# Patient Record
Sex: Female | Born: 1975 | Race: Black or African American | Hispanic: No | Smoking: Never smoker
Health system: Southern US, Community
[De-identification: ages and names within clinical notes are randomized; demographics above are authoritative.]

## PROBLEM LIST (undated history)

## (undated) DIAGNOSIS — R569 Unspecified convulsions: Secondary | ICD-10-CM

## (undated) DIAGNOSIS — D649 Anemia, unspecified: Secondary | ICD-10-CM

## (undated) DIAGNOSIS — M797 Fibromyalgia: Secondary | ICD-10-CM

## (undated) DIAGNOSIS — I10 Essential (primary) hypertension: Secondary | ICD-10-CM

## (undated) DIAGNOSIS — G5603 Carpal tunnel syndrome, bilateral upper limbs: Secondary | ICD-10-CM

## (undated) DIAGNOSIS — M1990 Unspecified osteoarthritis, unspecified site: Secondary | ICD-10-CM

## (undated) DIAGNOSIS — F32A Depression, unspecified: Secondary | ICD-10-CM

## (undated) DIAGNOSIS — E7800 Pure hypercholesterolemia, unspecified: Secondary | ICD-10-CM

## (undated) DIAGNOSIS — M545 Low back pain, unspecified: Secondary | ICD-10-CM

## (undated) DIAGNOSIS — G43909 Migraine, unspecified, not intractable, without status migrainosus: Secondary | ICD-10-CM

## (undated) DIAGNOSIS — J309 Allergic rhinitis, unspecified: Secondary | ICD-10-CM

## (undated) DIAGNOSIS — I82629 Acute embolism and thrombosis of deep veins of unspecified upper extremity: Secondary | ICD-10-CM

## (undated) DIAGNOSIS — G4700 Insomnia, unspecified: Secondary | ICD-10-CM

## (undated) DIAGNOSIS — K219 Gastro-esophageal reflux disease without esophagitis: Secondary | ICD-10-CM

## (undated) DIAGNOSIS — F319 Bipolar disorder, unspecified: Secondary | ICD-10-CM

## (undated) DIAGNOSIS — T7840XA Allergy, unspecified, initial encounter: Secondary | ICD-10-CM

## (undated) DIAGNOSIS — F329 Major depressive disorder, single episode, unspecified: Secondary | ICD-10-CM

## (undated) DIAGNOSIS — F419 Anxiety disorder, unspecified: Secondary | ICD-10-CM

## (undated) HISTORY — DX: Allergy, unspecified, initial encounter: T78.40XA

## (undated) HISTORY — DX: Essential (primary) hypertension: I10

## (undated) HISTORY — PX: FOOT SURGERY: SHX648

## (undated) HISTORY — DX: Insomnia, unspecified: G47.00

## (undated) HISTORY — DX: Unspecified osteoarthritis, unspecified site: M19.90

## (undated) HISTORY — DX: Major depressive disorder, single episode, unspecified: F32.9

## (undated) HISTORY — PX: ABDOMINAL HYSTERECTOMY: SHX81

## (undated) HISTORY — DX: Allergic rhinitis, unspecified: J30.9

## (undated) HISTORY — DX: Low back pain: M54.5

## (undated) HISTORY — DX: Depression, unspecified: F32.A

## (undated) HISTORY — PX: TUBAL LIGATION: SHX77

## (undated) HISTORY — DX: Acute embolism and thrombosis of deep veins of unspecified upper extremity: I82.629

## (undated) HISTORY — DX: Gastro-esophageal reflux disease without esophagitis: K21.9

## (undated) HISTORY — DX: Anemia, unspecified: D64.9

## (undated) HISTORY — DX: Bipolar disorder, unspecified: F31.9

## (undated) HISTORY — DX: Carpal tunnel syndrome, bilateral upper limbs: G56.03

## (undated) HISTORY — DX: Low back pain, unspecified: M54.50

## (undated) HISTORY — DX: Fibromyalgia: M79.7

## (undated) HISTORY — DX: Anxiety disorder, unspecified: F41.9

---

## 2012-01-08 DIAGNOSIS — R300 Dysuria: Secondary | ICD-10-CM | POA: Diagnosis not present

## 2012-01-08 DIAGNOSIS — E559 Vitamin D deficiency, unspecified: Secondary | ICD-10-CM | POA: Diagnosis not present

## 2012-02-01 DIAGNOSIS — S63509A Unspecified sprain of unspecified wrist, initial encounter: Secondary | ICD-10-CM | POA: Diagnosis not present

## 2012-02-01 DIAGNOSIS — J309 Allergic rhinitis, unspecified: Secondary | ICD-10-CM | POA: Diagnosis not present

## 2012-04-23 DIAGNOSIS — L600 Ingrowing nail: Secondary | ICD-10-CM | POA: Diagnosis not present

## 2012-04-23 DIAGNOSIS — M2010 Hallux valgus (acquired), unspecified foot: Secondary | ICD-10-CM | POA: Diagnosis not present

## 2012-04-23 DIAGNOSIS — M79609 Pain in unspecified limb: Secondary | ICD-10-CM | POA: Diagnosis not present

## 2012-04-23 DIAGNOSIS — M24573 Contracture, unspecified ankle: Secondary | ICD-10-CM | POA: Diagnosis not present

## 2012-04-23 DIAGNOSIS — M24576 Contracture, unspecified foot: Secondary | ICD-10-CM | POA: Diagnosis not present

## 2012-05-28 DIAGNOSIS — E559 Vitamin D deficiency, unspecified: Secondary | ICD-10-CM | POA: Diagnosis not present

## 2012-06-11 DIAGNOSIS — M25529 Pain in unspecified elbow: Secondary | ICD-10-CM | POA: Diagnosis not present

## 2012-06-11 DIAGNOSIS — M26609 Unspecified temporomandibular joint disorder, unspecified side: Secondary | ICD-10-CM | POA: Diagnosis not present

## 2012-06-12 DIAGNOSIS — I889 Nonspecific lymphadenitis, unspecified: Secondary | ICD-10-CM | POA: Diagnosis not present

## 2012-09-11 DIAGNOSIS — R1032 Left lower quadrant pain: Secondary | ICD-10-CM | POA: Diagnosis not present

## 2012-09-11 DIAGNOSIS — R110 Nausea: Secondary | ICD-10-CM | POA: Diagnosis not present

## 2012-09-11 DIAGNOSIS — K5909 Other constipation: Secondary | ICD-10-CM | POA: Diagnosis not present

## 2012-09-11 DIAGNOSIS — K5000 Crohn's disease of small intestine without complications: Secondary | ICD-10-CM | POA: Diagnosis not present

## 2012-09-19 DIAGNOSIS — R933 Abnormal findings on diagnostic imaging of other parts of digestive tract: Secondary | ICD-10-CM | POA: Diagnosis not present

## 2012-09-19 DIAGNOSIS — K5909 Other constipation: Secondary | ICD-10-CM | POA: Diagnosis not present

## 2012-09-19 DIAGNOSIS — K5000 Crohn's disease of small intestine without complications: Secondary | ICD-10-CM | POA: Diagnosis not present

## 2012-09-19 DIAGNOSIS — R110 Nausea: Secondary | ICD-10-CM | POA: Diagnosis not present

## 2012-09-25 DIAGNOSIS — E119 Type 2 diabetes mellitus without complications: Secondary | ICD-10-CM | POA: Diagnosis not present

## 2012-09-25 DIAGNOSIS — G609 Hereditary and idiopathic neuropathy, unspecified: Secondary | ICD-10-CM | POA: Diagnosis not present

## 2012-09-25 DIAGNOSIS — M2010 Hallux valgus (acquired), unspecified foot: Secondary | ICD-10-CM | POA: Diagnosis not present

## 2012-09-25 DIAGNOSIS — M79609 Pain in unspecified limb: Secondary | ICD-10-CM | POA: Diagnosis not present

## 2012-09-25 DIAGNOSIS — E559 Vitamin D deficiency, unspecified: Secondary | ICD-10-CM | POA: Diagnosis not present

## 2012-09-25 DIAGNOSIS — D529 Folate deficiency anemia, unspecified: Secondary | ICD-10-CM | POA: Diagnosis not present

## 2012-09-25 DIAGNOSIS — D509 Iron deficiency anemia, unspecified: Secondary | ICD-10-CM | POA: Diagnosis not present

## 2012-09-25 DIAGNOSIS — L600 Ingrowing nail: Secondary | ICD-10-CM | POA: Diagnosis not present

## 2012-09-25 DIAGNOSIS — D518 Other vitamin B12 deficiency anemias: Secondary | ICD-10-CM | POA: Diagnosis not present

## 2012-10-09 DIAGNOSIS — M24573 Contracture, unspecified ankle: Secondary | ICD-10-CM | POA: Diagnosis not present

## 2012-10-09 DIAGNOSIS — G609 Hereditary and idiopathic neuropathy, unspecified: Secondary | ICD-10-CM | POA: Diagnosis not present

## 2012-10-09 DIAGNOSIS — M24576 Contracture, unspecified foot: Secondary | ICD-10-CM | POA: Diagnosis not present

## 2012-10-09 DIAGNOSIS — L600 Ingrowing nail: Secondary | ICD-10-CM | POA: Diagnosis not present

## 2012-10-09 DIAGNOSIS — M2010 Hallux valgus (acquired), unspecified foot: Secondary | ICD-10-CM | POA: Diagnosis not present

## 2012-10-31 DIAGNOSIS — E119 Type 2 diabetes mellitus without complications: Secondary | ICD-10-CM | POA: Diagnosis not present

## 2012-10-31 DIAGNOSIS — H25019 Cortical age-related cataract, unspecified eye: Secondary | ICD-10-CM | POA: Diagnosis not present

## 2013-03-17 DIAGNOSIS — T148 Other injury of unspecified body region: Secondary | ICD-10-CM | POA: Diagnosis not present

## 2013-03-17 DIAGNOSIS — W57XXXA Bitten or stung by nonvenomous insect and other nonvenomous arthropods, initial encounter: Secondary | ICD-10-CM | POA: Diagnosis not present

## 2013-04-07 DIAGNOSIS — H60399 Other infective otitis externa, unspecified ear: Secondary | ICD-10-CM | POA: Diagnosis not present

## 2013-04-07 DIAGNOSIS — S63509A Unspecified sprain of unspecified wrist, initial encounter: Secondary | ICD-10-CM | POA: Diagnosis not present

## 2013-04-10 DIAGNOSIS — L723 Sebaceous cyst: Secondary | ICD-10-CM | POA: Diagnosis not present

## 2013-04-16 DIAGNOSIS — J309 Allergic rhinitis, unspecified: Secondary | ICD-10-CM | POA: Diagnosis not present

## 2013-04-16 DIAGNOSIS — R070 Pain in throat: Secondary | ICD-10-CM | POA: Diagnosis not present

## 2013-04-16 DIAGNOSIS — J018 Other acute sinusitis: Secondary | ICD-10-CM | POA: Diagnosis not present

## 2013-04-22 DIAGNOSIS — L910 Hypertrophic scar: Secondary | ICD-10-CM | POA: Diagnosis not present

## 2013-04-30 ENCOUNTER — Emergency Department (HOSPITAL_BASED_OUTPATIENT_CLINIC_OR_DEPARTMENT_OTHER): Payer: Medicare Other

## 2013-04-30 ENCOUNTER — Encounter (HOSPITAL_BASED_OUTPATIENT_CLINIC_OR_DEPARTMENT_OTHER): Payer: Self-pay | Admitting: Emergency Medicine

## 2013-04-30 ENCOUNTER — Emergency Department (HOSPITAL_BASED_OUTPATIENT_CLINIC_OR_DEPARTMENT_OTHER)
Admission: EM | Admit: 2013-04-30 | Discharge: 2013-04-30 | Disposition: A | Discharge status: Home / Self Care | Payer: Medicare Other | Attending: Emergency Medicine | Admitting: Emergency Medicine

## 2013-04-30 DIAGNOSIS — R209 Unspecified disturbances of skin sensation: Secondary | ICD-10-CM | POA: Diagnosis not present

## 2013-04-30 DIAGNOSIS — Z79899 Other long term (current) drug therapy: Secondary | ICD-10-CM | POA: Diagnosis not present

## 2013-04-30 DIAGNOSIS — H539 Unspecified visual disturbance: Secondary | ICD-10-CM | POA: Diagnosis not present

## 2013-04-30 DIAGNOSIS — G43909 Migraine, unspecified, not intractable, without status migrainosus: Secondary | ICD-10-CM | POA: Insufficient documentation

## 2013-04-30 DIAGNOSIS — R002 Palpitations: Secondary | ICD-10-CM | POA: Diagnosis not present

## 2013-04-30 DIAGNOSIS — R51 Headache: Secondary | ICD-10-CM | POA: Diagnosis not present

## 2013-04-30 DIAGNOSIS — G40909 Epilepsy, unspecified, not intractable, without status epilepticus: Secondary | ICD-10-CM | POA: Insufficient documentation

## 2013-04-30 HISTORY — DX: Unspecified convulsions: R56.9

## 2013-04-30 HISTORY — DX: Migraine, unspecified, not intractable, without status migrainosus: G43.909

## 2013-04-30 MED ORDER — METOCLOPRAMIDE HCL 10 MG PO TABS: 10.0000 mg | ORAL_TABLET | Freq: Four times a day (QID) | ORAL | Status: DC | PRN

## 2013-04-30 MED ORDER — HYDROCODONE-ACETAMINOPHEN 5-325 MG PO TABS: 2.0000 | ORAL_TABLET | ORAL | Status: DC | PRN

## 2013-04-30 NOTE — ED Notes (Signed)
Pt c/o headaches, blurred vision, tingling in left arm. Pt has been dx with migraines a long time ago. Pt [facility] at [facility] [place] and was told to come to ED for CT head

## 2013-04-30 NOTE — ED Notes (Signed)
Called to room. Pt requesting her potassium and vit D level be checked while she is here. Dr. Blinda Leatherwood made aware.

## 2013-04-30 NOTE — ED Provider Notes (Signed)
History     CSN: [id number]  Arrival date & time 04/30/13  2007   First MD Initiated Contact with Patient 04/30/13 2033      Chief Complaint  Patient presents with  . Headache    (Consider location/radiation/quality/duration/timing/severity/associated sxs/prior treatment) HPI Comments: Patient presents to the ER for evaluation of headache. Patient reports that she has been having throbbing pain in the top of her head that runs down the sides of her head. Symptoms have been present for 2 weeks. Pain is waxing and waning. She has a history of migraines. Patient reports pain and tingling in her left arm down to the fingertips. She also has pain and tingling running up and down the left leg. She was seen at urgent care earlier and told to come to the ER to get a CAT scan of her head.  Patient is a 37 y.o. female presenting with headaches.  Headache Associated symptoms: numbness     Past Medical History  Diagnosis Date  . Migraine   . Seizures     Past Surgical History  Procedure Laterality Date  . Abdominal hysterectomy    . Tubal ligation    . Foot surgery      No family history on file.  History  Substance Use Topics  . Smoking status: Never Smoker   . Smokeless tobacco: Not on file  . Alcohol Use: No    OB History   Grav Para Term Preterm Abortions TAB SAB Ect Mult Living                  Review of Systems  Neurological: Positive for numbness and headaches.  All other systems reviewed and are negative.    Allergies  Phenergan and Toradol  Home Medications   Current Outpatient Rx  Name  Route  Sig  Dispense  Refill  . cetirizine (ZYRTEC) 10 MG tablet   Oral   Take 10 mg by mouth daily.         . cholecalciferol (VITAMIN D) 1000 UNITS tablet   Oral   Take 1,000 Units by mouth daily.         . fluticasone (FLONASE) 50 MCG/ACT nasal spray   Nasal   Place 2 sprays into the nose daily.         . Multiple Vitamin (MULTIVITAMIN) capsule    Oral   Take 1 capsule by mouth daily.         . ondansetron (ZOFRAN-ODT) 4 MG disintegrating tablet   Oral   Take 4 mg by mouth every 8 (eight) hours as needed for nausea.         Marland Kitchen topiramate (TOPAMAX) 200 MG tablet   Oral   Take 300 mg by mouth 3 (three) times daily.           BP 135/91  Pulse 90  Temp(Src) 97.8 F (36.6 C) (Oral)  Resp 18  Ht 5\' 9"  (1.753 m)  Wt 158 lb (71.668 kg)  BMI 23.32 kg/m2  SpO2 100%  Physical Exam  Constitutional: She is oriented to person, place, and time. She appears well-developed and well-nourished. No distress.  HENT:  Head: Normocephalic and atraumatic.  Right Ear: Hearing normal.  Left Ear: Hearing normal.  Nose: Nose normal.  Mouth/Throat: Oropharynx is clear and moist and mucous membranes are normal.  Eyes: Conjunctivae and EOM are normal. Pupils are equal, round, and reactive to light.  Neck: Normal range of motion. Neck supple.  Cardiovascular: Regular rhythm, S1  normal and S2 normal.  Exam reveals no gallop and no friction rub.   No murmur heard. Pulmonary/Chest: Effort normal and breath sounds normal. No respiratory distress. She exhibits no tenderness.  Abdominal: Soft. Normal appearance and bowel sounds are normal. There is no hepatosplenomegaly. There is no tenderness. There is no rebound, no guarding, no tenderness at McBurney's point and negative Murphy's sign. No hernia.  Musculoskeletal: Normal range of motion.  Neurological: She is alert and oriented to person, place, and time. She has normal strength. No cranial nerve deficit or sensory deficit. Coordination normal. GCS eye subscore is 4. GCS verbal subscore is 5. GCS motor subscore is 6.  Skin: Skin is warm, dry and intact. No rash noted. No cyanosis.  Psychiatric: She has a normal mood and affect. Her speech is normal and behavior is normal. Thought content normal.    ED Course  Procedures (including critical care time)  Labs Reviewed - No data to display Ct  Head Wo Contrast  04/30/2013   *RADIOLOGY REPORT*  Clinical Data: Headache.  CT HEAD WITHOUT CONTRAST  Technique:  Contiguous axial images were obtained from the base of the skull through the vertex without contrast.  Comparison: No priors.  Findings: No acute intracranial abnormalities.  Specifically, no evidence of acute intracranial hemorrhage, no definite findings of acute/subacute cerebral ischemia, no mass, mass effect, hydrocephalus or abnormal intra or extra-axial fluid collections. Visualized paranasal sinuses and mastoids are well pneumatized.  No acute displaced skull fractures are identified.  IMPRESSION: 1.  No acute intracranial abnormalities. 2.  The appearance of the brain is normal.   Original Report Authenticated By: Trudie Reed, M.D.     Diagnosis: Migraine    MDM  Patient presents to me for evaluation of headache. Patient reports that she has had a headache for 2 weeks. She reports previous history of migraines. She apparently went to urgent care earlier and was told to come to the ER because she needed a CAT scan. Did not find anything focal on her neurologic exam. She is complaining of pain and numbness and tingling in the left arm and left leg. Typically, however is in conjunction with the pain, not consistent with a central nervous system process. I have no suspicion for intracranial bleed such as subarachnoid hemorrhage based on her prolonged two-week course of this headache and history of migraines. CAT scan was performed and is negative. Patient was driving, has a small child with her. No medications provided here that would be sedating. He'll be prescribed Reglan and 6 Vicodin tablets to fill at the pharmacy to take when she gets home.       Gilda Crease, MD 04/30/13 2114

## 2013-09-29 DIAGNOSIS — G40909 Epilepsy, unspecified, not intractable, without status epilepticus: Secondary | ICD-10-CM | POA: Diagnosis not present

## 2013-09-29 DIAGNOSIS — G8929 Other chronic pain: Secondary | ICD-10-CM | POA: Diagnosis not present

## 2013-09-29 DIAGNOSIS — R109 Unspecified abdominal pain: Secondary | ICD-10-CM | POA: Diagnosis not present

## 2013-09-29 DIAGNOSIS — F329 Major depressive disorder, single episode, unspecified: Secondary | ICD-10-CM | POA: Insufficient documentation

## 2013-09-29 DIAGNOSIS — J309 Allergic rhinitis, unspecified: Secondary | ICD-10-CM | POA: Diagnosis not present

## 2013-09-29 DIAGNOSIS — E8940 Asymptomatic postprocedural ovarian failure: Secondary | ICD-10-CM | POA: Insufficient documentation

## 2013-09-29 DIAGNOSIS — E8941 Symptomatic postprocedural ovarian failure: Secondary | ICD-10-CM | POA: Diagnosis not present

## 2013-09-29 DIAGNOSIS — J302 Other seasonal allergic rhinitis: Secondary | ICD-10-CM | POA: Insufficient documentation

## 2013-10-07 ENCOUNTER — Encounter: Payer: Self-pay | Admitting: Neurology

## 2013-10-08 ENCOUNTER — Ambulatory Visit (INDEPENDENT_AMBULATORY_CARE_PROVIDER_SITE_OTHER): Payer: Medicare Other | Admitting: Neurology

## 2013-10-08 ENCOUNTER — Encounter (INDEPENDENT_AMBULATORY_CARE_PROVIDER_SITE_OTHER): Payer: Self-pay

## 2013-10-08 ENCOUNTER — Encounter: Payer: Self-pay | Admitting: Neurology

## 2013-10-08 VITALS — BP 156/92 | HR 77 | Temp 98.3°F | Ht 69.0 in | Wt 162.0 lb

## 2013-10-08 DIAGNOSIS — R569 Unspecified convulsions: Secondary | ICD-10-CM

## 2013-10-08 DIAGNOSIS — G43009 Migraine without aura, not intractable, without status migrainosus: Secondary | ICD-10-CM

## 2013-10-08 DIAGNOSIS — Z5181 Encounter for therapeutic drug level monitoring: Secondary | ICD-10-CM

## 2013-10-08 MED ORDER — RIZATRIPTAN BENZOATE 10 MG PO TBDP: 10.0000 mg | ORAL_TABLET | Freq: Three times a day (TID) | ORAL | Status: DC | PRN

## 2013-10-08 MED ORDER — PREDNISONE 5 MG PO TABS: ORAL_TABLET | ORAL | Status: DC
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
## 2013-10-08 NOTE — Patient Instructions (Signed)
Epilepsy A seizure (convulsion) is a sudden change in brain function that causes a change in behavior, muscle activity, or ability to remain awake and alert. If a person has recurring seizures, this is called epilepsy. CAUSES  Epilepsy is a disorder with many possible causes. Anything that disturbs the normal pattern of brain cell activity can lead to seizures. Seizure can be caused from illness to brain damage to abnormal brain development. Epilepsy may develop because of:  An abnormality in brain wiring.  An imbalance of nerve signaling chemicals (neurotransmitters).  Some combination of these factors. Scientists are learning an increasing amount about genetic causes of seizures. SYMPTOMS  The symptoms of a seizure can vary greatly from one person to another. These may include:  An aura, or warning that tells a person they are about to have a seizure.  Abnormal sensations, such as abnormal smell or seeing flashing lights.  Sudden, general body stiffness.  Rhythmic jerking of the face, arm, or leg  on one or both sides.  Sudden change in consciousness.  The person may appear to be awake but not responding.  They may appear to be asleep but cannot be awakened.  Grimacing, chewing, lip smacking, or drooling.  Often there is a period of sleepiness after a seizure. DIAGNOSIS  The description you give to your caregiver about what you experienced will help them understand your problems. Equally important is the description by any witnesses to your seizure. A physical exam, including a detailed neurological exam, is necessary. An EEG (electroencephalogram) is a painless test of your brain waves. In this test a diagram is created of your brain waves. These diagrams can be interpreted by a specialist. Pictures of your brain are usually taken with:  An MRI.  A CT scan. Lab tests may be done to look for:  Signs of infection.  Abnormal blood chemistry. PREVENTION  There is no way to  prevent the development of epilepsy. If you have seizures that are typically triggered by an event (such as flashing lights), try to avoid the trigger. This can help you avoid a seizure.  PROGNOSIS  Most people with epilepsy lead outwardly normal lives. While epilepsy cannot currently be cured, for some people it does eventually go away. Most seizures do not cause brain damage. It is not uncommon for people with epilepsy, especially children, to develop behavioral and emotional problems. These problems are sometimes the consequence of medicine for seizures or social stress. For some people with epilepsy, the risk of seizures restricts their independence and recreational activities. For example, some states refuse drivers licenses to people with epilepsy. Most women with epilepsy can become pregnant. They should discuss their epilepsy and the medicine they are taking with their caregivers. Women with epilepsy have a 90 percent or better chance of having a normal, healthy baby. RISKS AND COMPLICATIONS  People with epilepsy are at increased risk of falls, accidents, and injuries. People with epilepsy are at special risk for two life-threatening conditions. These are status epilepticus and sudden unexplained death (extremely rare). Status epilepticus is a long lasting, continuous seizure that is a medical emergency. TREATMENT  Once epilepsy is diagnosed, it is important to begin treatment as soon as possible. For about 80 percent of those diagnosed with epilepsy, seizures can be controlled with modern medicines and surgical techniques. Some antiepileptic drugs can interfere with the effectiveness of oral contraceptives. In 1997, the FDA approved a pacemaker for the brain the (vagus nerve stimulator). This stimulator can be used for   people with seizures that are not well-controlled by medicine. Studies have shown that in some cases, children may experience fewer seizures if they maintain a strict diet. The strict  diet is called the ketogenic diet. This diet is rich in fats and low in carbohydrates. HOME CARE INSTRUCTIONS   Your caregiver will make recommendations about driving and safety in normal activities. Follow these carefully.  Take any medicine prescribed exactly as directed.  Do any blood tests requested to monitor the levels of your medicine.  The people you live and work with should know that you are prone to seizures. They should receive instructions on how to help you. In general, a witness to a seizure should:  Cushion your head and body.  Turn you on your side.  Avoid unnecessarily restraining you.  Not place anything inside your mouth.  Call for local emergency medical help if there is any question about what has occurred.  Keep a seizure diary. Record what you recall about any seizure, especially any possible trigger.  If your caregiver has given you a follow-up appointment, it is very important to keep that appointment. Not keeping the appointment could result in permanent injury and disability. If there is any problem keeping the appointment, you must call back to this facility for assistance. SEEK MEDICAL CARE IF:   You develop signs of infection or other illness. This might increase the risk of a seizure.  You seem to be having more frequent seizures.  Your seizure pattern is changing. SEEK IMMEDIATE MEDICAL CARE IF:   A seizure does not stop after a few moments.  A seizure causes any difficulty in breathing.  A seizure results in a very severe headache.  A seizure leaves you with the inability to speak or use a part of your body. MAKE SURE YOU:   Understand these instructions.  Will watch your condition.  Will get help right away if you are not doing well or get worse. Document Released: 10/30/2005 Document Revised: 01/22/2012 Document Reviewed: 06/11/2013 [employer] Patient Information 2014 [employer].  

## 2013-10-16 ENCOUNTER — Ambulatory Visit (INDEPENDENT_AMBULATORY_CARE_PROVIDER_SITE_OTHER): Payer: Medicare Other | Admitting: Podiatry

## 2013-10-16 ENCOUNTER — Ambulatory Visit (INDEPENDENT_AMBULATORY_CARE_PROVIDER_SITE_OTHER): Payer: Medicare Other

## 2013-10-16 ENCOUNTER — Encounter: Payer: Self-pay | Admitting: Podiatry

## 2013-10-16 VITALS — BP 119/79 | HR 73 | Resp 16 | Ht 69.0 in | Wt 164.0 lb

## 2013-10-16 DIAGNOSIS — R52 Pain, unspecified: Secondary | ICD-10-CM

## 2013-10-16 DIAGNOSIS — M778 Other enthesopathies, not elsewhere classified: Secondary | ICD-10-CM

## 2013-10-16 DIAGNOSIS — M2020 Hallux rigidus, unspecified foot: Secondary | ICD-10-CM

## 2013-10-16 DIAGNOSIS — M205X9 Other deformities of toe(s) (acquired), unspecified foot: Secondary | ICD-10-CM

## 2013-10-16 DIAGNOSIS — M7750 Other enthesopathy of unspecified foot: Secondary | ICD-10-CM

## 2013-10-16 DIAGNOSIS — M2021 Hallux rigidus, right foot: Secondary | ICD-10-CM

## 2013-10-16 NOTE — Progress Notes (Signed)
"  I get sharp pains in my big toe when I walk."   N  Sharp shooting pains, tingling, cramping, numbness L  Bunion Rt. Painful D  Over 3 months O  Gradually  C  Gotten worse A  Walking, flexing it back T  Bengay, brace

## 2013-10-17 ENCOUNTER — Encounter: Payer: Self-pay | Admitting: *Deleted

## 2013-10-17 ENCOUNTER — Telehealth: Payer: Self-pay | Admitting: *Deleted

## 2013-10-17 MED ORDER — TOPIRAMATE 100 MG PO TABS: 100.0000 mg | ORAL_TABLET | Freq: Two times a day (BID) | ORAL | Status: DC

## 2013-10-17 NOTE — Addendum Note (Signed)
Addended by: Adelei Acre on: 10/17/2013 06:47 PM   Modules accepted: Orders, Medications

## 2013-10-17 NOTE — Telephone Encounter (Addendum)
Left message [phone] to contact our office with demographics for Dr Julianne Rice.  Pt called with the address for Dr Delbert Harness, [facility] [address] [place], [place] [address], fax [phone].  Letters of medical clearance sent to Dr Lesia Sago - faxed [phone], Dr Delbert Harness - faxed [phone].

## 2013-10-17 NOTE — Telephone Encounter (Signed)
I called and spoke with Molly [name] and relayed the message about appt.  She stated that she has had EEG and will fax this to [place] along with the copy of her medication label re: her medication that she is taking.  I told her to fax to me at [phone], will show to [name].  I will not cancel the EG appt at this time.

## 2013-10-17 NOTE — Telephone Encounter (Signed)
I called the patient. The patient needs a Rx for the topamax and for phenytoin. I am not sure that the phenytoin dose is accurate. The dilantin level ordered is pending. She is to come in next week for the blood work. I will call in a Rx for the Topamax.

## 2013-10-17 NOTE — Telephone Encounter (Signed)
I calle pt after speaking with [name].   I explained to pt that [name] would still like for her to come and have the EEG.  I could not answer the question about prescribing her meds.  She stated that all she needed was for a doctor to prescribe her medications.  She did not think she needed to have EEG since had this before and then to have VEEG in January.   I told her that she would require EEG or ambulatory EEG prior to VEEG (that is there requirement).  She decided to cancel the EEG for this Monday and then call back to reschedule.  Meds without EEG?

## 2013-10-17 NOTE — Telephone Encounter (Signed)
Message copied by Hermenia Fiscal on Fri Oct 17, 2013  9:10 AM ------      Message from: Rabecka Acre      Created: Thu Oct 16, 2013  9:27 PM       Please call the patient and tell her that I would still want the EEG that was ordered for next Monday. Thanks.      ----- Message -----         From: Elnita Maxwell Poteat         Sent: 10/15/2013   1:11 PM           To: York Spaniel, MD            Patient wants to know if she still needs to come here on Monday for an EEG since she will be going to [facility] January 28 at 9:00, will see Dr. Francine Graven.            Sandy P.       ------

## 2013-10-18 NOTE — Progress Notes (Signed)
Molly [name] presents today as a 37 year old female with a chief complaint of a painful bunion to the right foot for the past 3 months she states this gradually seems to be getting worse walking bothers it and anytime Aflexa back bothers it she's tried BenGay and braces. She denies trauma to the foot. Particularly painful with shoe gear.  Objective: I have reviewed her past medical history medications allergies surgeries social history. Vital signs are stable she is alert and oriented x3. Lower extremity evaluation demonstrates strong palpable pulses bilateral foot. Capillary fill time to digits one through 5 of the bilateral foot is noted to be immediate. Neurologic sensorium is intact per Semmes-Weinstein monofilament. Deep tendon reflexes are intact bilateral. Muscle strength + over 5 dorsiflexors plantar flexors inverters everters all intrinsic musculature is intact. Orthopedic evaluation demonstrates all joints distal to the ankle have a full range of motion without crepitus with exception of the first metatarsophalangeal joint of the right foot she has good plantarflexion but is extremely limited on dorsiflexion with crepitation on range of motion. Large nonpulsatile mass noted to the dorsal aspect of the first metatarsophalangeal joint consistent with spurring. Radiographic evaluation demonstrates joint space narrowing with an elevated first metatarsal resulting in hallux limitus. Dermatologic evaluation demonstrates supple well hydrated cutis no erythema cellulitis drainage or odor only local edema overlying the first metatarsophalangeal joint.  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  Plan: We discussed the etiology pathology conservative versus surgical therapies at this point we opted for intra-articular injection with dexamethasone and local anesthetic 2 mg was injected after a sterile Betadine skin prep. We also went over consent form today line  bylined number by number giving her ample time to ask questions she saw fit regarding an Jerrol Banana with osteotomy with screw fixation. I answered all these questions to the best of my ability in layman's terms, she understood it was amenable to it and signed Dr. pages of the consent form. She was dispensed a Cam Walker for surgery and I will followup with her in the near future.

## 2013-10-20 ENCOUNTER — Other Ambulatory Visit: Payer: Medicare Other | Admitting: Radiology

## 2013-10-28 ENCOUNTER — Telehealth: Payer: Self-pay | Admitting: Neurology

## 2013-10-28 NOTE — Telephone Encounter (Signed)
This patient has been seen by [facility]. The patient is being considered for a bunionectomy under local and IV sedation. The patient reports intractable seizures, and the patient was recently seen through our office, seizure workup is pending. If IV access is present, the risks of the surgery should be relatively low, I see no significant neurologic issues that would prevent the surgery.

## 2013-11-07 ENCOUNTER — Encounter: Payer: Self-pay | Admitting: Podiatry

## 2013-11-07 DIAGNOSIS — M2010 Hallux valgus (acquired), unspecified foot: Secondary | ICD-10-CM | POA: Diagnosis not present

## 2013-11-07 DIAGNOSIS — M205X9 Other deformities of toe(s) (acquired), unspecified foot: Secondary | ICD-10-CM | POA: Diagnosis not present

## 2013-11-07 DIAGNOSIS — M779 Enthesopathy, unspecified: Secondary | ICD-10-CM | POA: Diagnosis not present

## 2013-11-07 DIAGNOSIS — M2030 Hallux varus (acquired), unspecified foot: Secondary | ICD-10-CM | POA: Diagnosis not present

## 2013-11-07 DIAGNOSIS — M216X9 Other acquired deformities of unspecified foot: Secondary | ICD-10-CM | POA: Diagnosis not present

## 2013-11-07 DIAGNOSIS — M25579 Pain in unspecified ankle and joints of unspecified foot: Secondary | ICD-10-CM | POA: Diagnosis not present

## 2013-11-07 DIAGNOSIS — K219 Gastro-esophageal reflux disease without esophagitis: Secondary | ICD-10-CM | POA: Diagnosis not present

## 2013-11-07 DIAGNOSIS — M21619 Bunion of unspecified foot: Secondary | ICD-10-CM | POA: Diagnosis not present

## 2013-11-07 HISTORY — PX: BUNIONECTOMY: SHX129

## 2013-11-07 HISTORY — PX: AIKEN OSTEOTOMY: SHX6331

## 2013-11-08 ENCOUNTER — Encounter (HOSPITAL_COMMUNITY): Payer: Self-pay | Admitting: Emergency Medicine

## 2013-11-08 ENCOUNTER — Emergency Department (HOSPITAL_BASED_OUTPATIENT_CLINIC_OR_DEPARTMENT_OTHER)
Admission: EM | Admit: 2013-11-08 | Discharge: 2013-11-08 | Disposition: A | Discharge status: Home / Self Care | Payer: Medicare Other | Attending: Emergency Medicine | Admitting: Emergency Medicine

## 2013-11-08 ENCOUNTER — Encounter (HOSPITAL_BASED_OUTPATIENT_CLINIC_OR_DEPARTMENT_OTHER): Payer: Self-pay | Admitting: Emergency Medicine

## 2013-11-08 ENCOUNTER — Emergency Department (HOSPITAL_COMMUNITY)
Admission: EM | Admit: 2013-11-08 | Discharge: 2013-11-08 | Discharge status: Against Medical Advice | Payer: Medicare Other | Source: Home / Self Care

## 2013-11-08 DIAGNOSIS — R569 Unspecified convulsions: Secondary | ICD-10-CM | POA: Insufficient documentation

## 2013-11-08 DIAGNOSIS — G43909 Migraine, unspecified, not intractable, without status migrainosus: Secondary | ICD-10-CM | POA: Insufficient documentation

## 2013-11-08 DIAGNOSIS — G40909 Epilepsy, unspecified, not intractable, without status epilepticus: Secondary | ICD-10-CM | POA: Insufficient documentation

## 2013-11-08 DIAGNOSIS — M79609 Pain in unspecified limb: Secondary | ICD-10-CM | POA: Insufficient documentation

## 2013-11-08 DIAGNOSIS — Z8739 Personal history of other diseases of the musculoskeletal system and connective tissue: Secondary | ICD-10-CM | POA: Insufficient documentation

## 2013-11-08 DIAGNOSIS — Z79899 Other long term (current) drug therapy: Secondary | ICD-10-CM | POA: Insufficient documentation

## 2013-11-08 DIAGNOSIS — K219 Gastro-esophageal reflux disease without esophagitis: Secondary | ICD-10-CM | POA: Insufficient documentation

## 2013-11-08 DIAGNOSIS — G8918 Other acute postprocedural pain: Secondary | ICD-10-CM | POA: Diagnosis not present

## 2013-11-08 DIAGNOSIS — Z8669 Personal history of other diseases of the nervous system and sense organs: Secondary | ICD-10-CM | POA: Insufficient documentation

## 2013-11-08 DIAGNOSIS — G8928 Other chronic postprocedural pain: Secondary | ICD-10-CM | POA: Insufficient documentation

## 2013-11-08 MED ORDER — OXYCODONE-ACETAMINOPHEN 5-325 MG PO TABS
ORAL_TABLET | ORAL | Status: AC
  Filled 2013-11-08: qty 2

## 2013-11-08 MED ORDER — OXYCODONE-ACETAMINOPHEN 5-325 MG PO TABS
2.0000 | ORAL_TABLET | Freq: Once | ORAL | Status: AC
  Administered 2013-11-08: 2 via ORAL

## 2013-11-08 NOTE — ED Notes (Signed)
Pt. reports post operative pain at right big toe surgery yesterday  by Dr. Al Corpus  unrelieved by prescription Percocet .

## 2013-11-08 NOTE — ED Provider Notes (Signed)
CSN: [id number]     Arrival date & time 11/08/13  0330 History   First MD Initiated Contact with Patient 11/08/13 [phone]     Chief Complaint  Patient presents with  . Foot Pain   (Consider location/radiation/quality/duration/timing/severity/associated sxs/prior Treatment) Patient is a 37 y.o. female presenting with lower extremity pain. The history is provided by the patient. No language interpreter was used.  Foot Pain This is a new problem. The current episode started 1 to 2 hours ago. The problem occurs constantly. The problem has not changed since onset.Pertinent negatives include no chest pain, no abdominal pain, no headaches and no shortness of breath. Nothing aggravates the symptoms. Nothing relieves the symptoms. The treatment provided no relief.  Had foot surgery 12/26 at 1215.  Has not taken her percocet and local anesthesia has worn off now in pain.  Could not wait to be seen at [facility] so came to the ED at [facility]  Past Medical History  Diagnosis Date  . Migraine   . Seizures   . GERD (gastroesophageal reflux disease)   . Lumbago    Past Surgical History  Procedure Laterality Date  . Abdominal hysterectomy    . Tubal ligation    . Foot surgery      bilateral, toe surgery   Family History  Problem Relation Age of Onset  . Seizures Mother   . Emphysema Father   . Seizures Maternal Grandmother   . Seizures Sister   . Bipolar disorder Sister    History  Substance Use Topics  . Smoking status: Never Smoker   . Smokeless tobacco: Never Used  . Alcohol Use: No   OB History   Grav Para Term Preterm Abortions TAB SAB Ect Mult Living                 Review of Systems  Respiratory: Negative for shortness of breath.   Cardiovascular: Negative for chest pain.  Gastrointestinal: Negative for abdominal pain.  Neurological: Negative for headaches.  All other systems reviewed and are negative.    Allergies  Phenergan and Toradol  Home Medications   Current Outpatient  Rx  Name  Route  Sig  Dispense  Refill  . cefadroxil (DURICEF) 500 MG capsule   Oral   Take 500 mg by mouth 2 (two) times daily.         . cetirizine (ZYRTEC) 10 MG tablet   Oral   Take 10 mg by mouth daily.         . cholecalciferol (VITAMIN D) 1000 UNITS tablet   Oral   Take 1,000 Units by mouth daily.         . cyclobenzaprine (FLEXERIL) 10 MG tablet   Oral   Take 1 tablet by mouth 3 (three) times daily.         Marland Kitchen dicyclomine (BENTYL) 10 MG capsule   Oral   Take 1 capsule by mouth 2 (two) times daily.         Marland Kitchen escitalopram (LEXAPRO) 10 MG tablet   Oral   Take 1 tablet by mouth daily.         . fluticasone (FLONASE) 50 MCG/ACT nasal spray   Nasal   Place 2 sprays into the nose daily.         Marland Kitchen HYDROcodone-acetaminophen (NORCO/VICODIN) 5-325 MG per tablet   Oral   Take 2 tablets by mouth every 4 (four) hours as needed for pain.   6 tablet   0   .  ibuprofen (ADVIL,MOTRIN) 800 MG tablet   Oral   Take 800 mg by mouth every 8 (eight) hours as needed.         . metoCLOPramide (REGLAN) 10 MG tablet   Oral   Take 1 tablet (10 mg total) by mouth every 6 (six) hours as needed (nausea/headache).   6 tablet   0   . Multiple Vitamin (MULTIVITAMIN) capsule   Oral   Take 1 capsule by mouth daily.         . naproxen (NAPRELAN) 500 MG 24 hr tablet   Oral   Take 750 mg by mouth daily with breakfast.         . NEXIUM 20 MG capsule   Oral   Take 1 capsule by mouth daily.         . ondansetron (ZOFRAN) 8 MG tablet   Oral   Take 1 tablet by mouth 3 (three) times daily.         Marland Kitchen oxyCODONE (OXY IR/ROXICODONE) 5 MG immediate release tablet   Oral   Take 1 tablet by mouth every 4 (four) hours as needed.         Marland Kitchen PATANOL 0.1 % ophthalmic solution   Both Eyes   Place 1 drop into both eyes. qd         . PHENYTEK 300 MG ER capsule   Oral   Take 1 capsule by mouth 3 (three) times daily.          . polyethylene glycol (MIRALAX / GLYCOLAX)  packet      daily. 4-8oz daily         . predniSONE (DELTASONE) 5 MG tablet      Began taking 6 tablets daily, taper by one tablet daily until off the medication.   21 tablet   0   . rizatriptan (MAXALT-MLT) 10 MG disintegrating tablet   Oral   Take 1 tablet (10 mg total) by mouth 3 (three) times daily as needed for migraine. May repeat in 2 hours if needed   12 tablet   5   . topiramate (TOPAMAX) 100 MG tablet   Oral   Take 1 tablet (100 mg total) by mouth 2 (two) times daily.   60 tablet   5   . zolpidem (AMBIEN) 5 MG tablet   Oral   Take 1 tablet by mouth at bedtime as needed and may repeat dose one time if needed.         . divalproex (DEPAKOTE) 500 MG DR tablet   Oral   Take 1,000 mg by mouth at bedtime as needed and may repeat dose one time if needed.          BP 113/76  Pulse 88  Temp(Src) 100.3 F (37.9 C) (Oral)  Resp 20  Ht 5\' 9"  (1.753 m)  Wt 154 lb (69.854 kg)  BMI 22.73 kg/m2  SpO2 94% Physical Exam  Constitutional: She is oriented to person, place, and time. She appears well-developed and well-nourished. No distress.  HENT:  Head: Normocephalic and atraumatic.  Eyes: Conjunctivae and EOM are normal.  Neck: Normal range of motion. Neck supple.  Cardiovascular: Normal rate and regular rhythm.   Pulmonary/Chest: Effort normal and breath sounds normal. She has no wheezes. She has no rales.  Abdominal: Soft. Bowel sounds are normal.  Musculoskeletal: Normal range of motion.       Right foot: She exhibits normal range of motion, normal capillary refill, no crepitus and no deformity.  Feet:  Neurological: She is alert and oriented to person, place, and time.  Skin: Skin is warm and dry.  Psychiatric: She has a normal mood and affect.    ED Course  Procedures (including critical care time) Labs Review Labs Reviewed - No data to display Imaging Review No results found.  EKG Interpretation   None       MDM  No diagnosis  found. Percocet given have advised ice 20 minutes on every 2-4 hours.  Elevation above the level of the heart.  Take you pain medication as directed. Call your surgeon in the am   Gissele Narducci K Malissie Musgrave-Rasch, MD 11/08/13 [phone]

## 2013-11-08 NOTE — ED Notes (Signed)
Pt reports she had RLE surgery Friday morning = now reports severe pain to RLE. States she had surgery for a broken toe and they placed a pin into her right great toe.

## 2013-11-10 ENCOUNTER — Telehealth: Payer: Self-pay | Admitting: *Deleted

## 2013-11-10 NOTE — Telephone Encounter (Signed)
Have her in tomorrow.

## 2013-11-10 NOTE — Telephone Encounter (Signed)
Pt complains of severe pain and request earlier appt, and request stronger pain medications.  I called, left a message to call for a earlier appt, then ask to speak to me.

## 2013-11-11 ENCOUNTER — Ambulatory Visit (INDEPENDENT_AMBULATORY_CARE_PROVIDER_SITE_OTHER): Payer: Medicare Other | Admitting: Podiatry

## 2013-11-11 ENCOUNTER — Ambulatory Visit (INDEPENDENT_AMBULATORY_CARE_PROVIDER_SITE_OTHER): Payer: Medicare Other

## 2013-11-11 ENCOUNTER — Telehealth: Payer: Self-pay | Admitting: *Deleted

## 2013-11-11 ENCOUNTER — Encounter: Payer: Self-pay | Admitting: Podiatry

## 2013-11-11 VITALS — BP 143/95 | HR 93 | Resp 16

## 2013-11-11 DIAGNOSIS — Z9889 Other specified postprocedural states: Secondary | ICD-10-CM

## 2013-11-11 DIAGNOSIS — M2010 Hallux valgus (acquired), unspecified foot: Secondary | ICD-10-CM | POA: Diagnosis not present

## 2013-11-11 MED ORDER — OXYCODONE-ACETAMINOPHEN 10-325 MG PO TABS: ORAL_TABLET | ORAL | Status: DC

## 2013-11-11 MED ORDER — OXYCODONE-ACETAMINOPHEN 10-325 MG PO TABS: 1.0000 | ORAL_TABLET | ORAL | Status: DC | PRN

## 2013-11-11 NOTE — Progress Notes (Signed)
Molly [name] presents today utilizing crutches and in considerable pain with her surgical foot right. She is status post Sports coach and Akin osteotomy. She states that the pain was so bad she had to go to the ER for evaluation. She denies fever chills nausea vomiting muscle aches and pains. She continues to have pain about the right foot with dependency. She denies calf pain other than soreness at her injection site for her sciatic block.  Objective: Vital signs are stable she is alert and oriented x3. She appears to be in distress while utilizing crutches. Cam Walker was in place and dry sterile compressive dressing was intact. Once a dry sterile compressive dressing was removed pulses remain strongly palpable to the right lower extremity. Mild to moderate edema over her incision site and the forefoot right. Minimal ecchymosis. The hallux is in slight extensus more than likely due to her utilizing the crutches. Radiographic evaluation demonstrates the [facility] bunion repair is in good position. The Akin osteotomy is in good position. However her hallux does demonstrate a mild extensus on radiograph but positioning alters that as well.  Assessment: 1 week status post Austin bunion repair and Akin osteotomy right foot.  Plan: Redressed the right foot today with a dry sterile compressive dressing. She's to start One-A-Day 81 mg baby aspirin. She will start two Aleve tablets twice daily. She will continue to be nonweightbearing utilizing a wheelchair instead of the crutches. She has mobility limitations that cannot be resolved with a cane crutches or walkers. She is able to self propel a lighter weight will chair but not a heavy standard weight will chair and her husband can assist her. She will also need a wheelchair with a leg lift/foot rest right side. She was prescribed more Percocet and I will followup with her in one to 2 weeks. I encouraged range of motion exercises for the toe

## 2013-11-11 NOTE — Telephone Encounter (Signed)
Pt states [facility] would not accept her insurance, but [facility] would.  I faxed the required prescription form, pt insurance and demographic and last office note to [phone].

## 2013-11-14 NOTE — Telephone Encounter (Signed)
PATIENT SAW DR Milinda Pointer ON Tuesday 11-11-13

## 2013-11-15 ENCOUNTER — Emergency Department (HOSPITAL_BASED_OUTPATIENT_CLINIC_OR_DEPARTMENT_OTHER)
Admission: EM | Admit: 2013-11-15 | Discharge: 2013-11-15 | Disposition: A | Discharge status: Home / Self Care | Payer: Medicare Other | Attending: Emergency Medicine | Admitting: Emergency Medicine

## 2013-11-15 ENCOUNTER — Emergency Department (HOSPITAL_BASED_OUTPATIENT_CLINIC_OR_DEPARTMENT_OTHER): Payer: Medicare Other

## 2013-11-15 ENCOUNTER — Encounter (HOSPITAL_BASED_OUTPATIENT_CLINIC_OR_DEPARTMENT_OTHER): Payer: Self-pay | Admitting: Emergency Medicine

## 2013-11-15 DIAGNOSIS — Y9389 Activity, other specified: Secondary | ICD-10-CM | POA: Insufficient documentation

## 2013-11-15 DIAGNOSIS — M79671 Pain in right foot: Secondary | ICD-10-CM

## 2013-11-15 DIAGNOSIS — S99919A Unspecified injury of unspecified ankle, initial encounter: Principal | ICD-10-CM

## 2013-11-15 DIAGNOSIS — K219 Gastro-esophageal reflux disease without esophagitis: Secondary | ICD-10-CM | POA: Insufficient documentation

## 2013-11-15 DIAGNOSIS — W010XXA Fall on same level from slipping, tripping and stumbling without subsequent striking against object, initial encounter: Secondary | ICD-10-CM | POA: Insufficient documentation

## 2013-11-15 DIAGNOSIS — S99929A Unspecified injury of unspecified foot, initial encounter: Secondary | ICD-10-CM | POA: Diagnosis not present

## 2013-11-15 DIAGNOSIS — M79609 Pain in unspecified limb: Secondary | ICD-10-CM | POA: Diagnosis not present

## 2013-11-15 DIAGNOSIS — G40909 Epilepsy, unspecified, not intractable, without status epilepticus: Secondary | ICD-10-CM | POA: Diagnosis not present

## 2013-11-15 DIAGNOSIS — S8990XA Unspecified injury of unspecified lower leg, initial encounter: Secondary | ICD-10-CM | POA: Diagnosis not present

## 2013-11-15 DIAGNOSIS — Z792 Long term (current) use of antibiotics: Secondary | ICD-10-CM | POA: Diagnosis not present

## 2013-11-15 DIAGNOSIS — Y929 Unspecified place or not applicable: Secondary | ICD-10-CM | POA: Insufficient documentation

## 2013-11-15 DIAGNOSIS — Z9889 Other specified postprocedural states: Secondary | ICD-10-CM | POA: Diagnosis not present

## 2013-11-15 DIAGNOSIS — G43909 Migraine, unspecified, not intractable, without status migrainosus: Secondary | ICD-10-CM | POA: Insufficient documentation

## 2013-11-15 DIAGNOSIS — Z791 Long term (current) use of non-steroidal anti-inflammatories (NSAID): Secondary | ICD-10-CM | POA: Insufficient documentation

## 2013-11-15 DIAGNOSIS — IMO0002 Reserved for concepts with insufficient information to code with codable children: Secondary | ICD-10-CM | POA: Diagnosis not present

## 2013-11-15 DIAGNOSIS — Z79899 Other long term (current) drug therapy: Secondary | ICD-10-CM | POA: Diagnosis not present

## 2013-11-15 MED ORDER — ONDANSETRON 8 MG PO TBDP
8.0000 mg | ORAL_TABLET | Freq: Once | ORAL | Status: AC
  Administered 2013-11-15: 8 mg via ORAL
  Filled 2013-11-15: qty 1

## 2013-11-15 MED ORDER — OXYCODONE-ACETAMINOPHEN 5-325 MG PO TABS
1.0000 | ORAL_TABLET | Freq: Once | ORAL | Status: AC
  Administered 2013-11-15: 1 via ORAL
  Filled 2013-11-15: qty 1

## 2013-11-15 NOTE — ED Notes (Signed)
Pt had bunionectomy on Friday- fell today and now has bleeding to foot- cam walker in place

## 2013-11-15 NOTE — Discharge Instructions (Signed)
Your x-ray tonight shows that you have no new injuries to the foot. Continue your home care as instructed by your surgeon.

## 2013-11-15 NOTE — ED Provider Notes (Signed)
CSN: [id number]     Arrival date & time 11/15/13  1640 History   First MD Initiated Contact with Patient 11/15/13 1912     Chief Complaint  Patient presents with  . Post-op Problem   (Consider location/radiation/quality/duration/timing/severity/associated sxs/prior Treatment) Patient is a 38 y.o. female presenting with lower extremity pain. The history is provided by the patient.  Foot Pain This is a new problem. The current episode started today. The problem occurs constantly. The problem has been unchanged.   Molly [name] is a 37 y.o. female who presents to the ED with right foot pain. She had a bunionectomy and is walking in a cam walker. Today she was using crutches and one slipped and she went down hard on the foot she had surgery on. She is afraid she broke a bone or messed up something. She complains of pain in the foot and some swelling over the suture area. She did not turn her foot it went flat on the floor. She thought initially that the incision site was bleeding but when the dressing was removed there was not bleeding.   Past Medical History  Diagnosis Date  . Migraine   . GERD (gastroesophageal reflux disease)   . Lumbago   . Seizures     last seizure nov 2014   Past Surgical History  Procedure Laterality Date  . Abdominal hysterectomy    . Tubal ligation    . Foot surgery      bilateral, toe surgery  . Bunionectomy Right 11-07-2013  . Aiken osteotomy Right 11-07-2013   Family History  Problem Relation Age of Onset  . Seizures Mother   . Emphysema Father   . Seizures Maternal Grandmother   . Seizures Sister   . Bipolar disorder Sister    History  Substance Use Topics  . Smoking status: Never Smoker   . Smokeless tobacco: Never Used  . Alcohol Use: No   OB History   Grav Para Term Preterm Abortions TAB SAB Ect Mult Living                 Review of Systems Negative except as states in HPI  Allergies  Phenergan and Toradol  Home Medications    Current Outpatient Rx  Name  Route  Sig  Dispense  Refill  . cefadroxil (DURICEF) 500 MG capsule   Oral   Take 500 mg by mouth 2 (two) times daily.         . cetirizine (ZYRTEC) 10 MG tablet   Oral   Take 10 mg by mouth daily.         . cholecalciferol (VITAMIN D) 1000 UNITS tablet   Oral   Take 1,000 Units by mouth daily.         . cyclobenzaprine (FLEXERIL) 10 MG tablet   Oral   Take 1 tablet by mouth 3 (three) times daily.         Marland Kitchen dicyclomine (BENTYL) 10 MG capsule   Oral   Take 1 capsule by mouth 2 (two) times daily.         . divalproex (DEPAKOTE) 500 MG DR tablet   Oral   Take 1,000 mg by mouth at bedtime as needed and may repeat dose one time if needed.         Marland Kitchen escitalopram (LEXAPRO) 10 MG tablet   Oral   Take 1 tablet by mouth daily.         . fluticasone (FLONASE) 50 MCG/ACT nasal spray  Nasal   Place 2 sprays into the nose daily.         Marland Kitchen ibuprofen (ADVIL,MOTRIN) 800 MG tablet   Oral   Take 800 mg by mouth every 8 (eight) hours as needed.         . Multiple Vitamin (MULTIVITAMIN) capsule   Oral   Take 1 capsule by mouth daily.         . naproxen (NAPRELAN) 500 MG 24 hr tablet   Oral   Take 750 mg by mouth daily with breakfast.         . NEXIUM 20 MG capsule   Oral   Take 1 capsule by mouth daily.         . ondansetron (ZOFRAN) 8 MG tablet   Oral   Take 1 tablet by mouth 3 (three) times daily.         Marland Kitchen oxyCODONE (OXY IR/ROXICODONE) 5 MG immediate release tablet   Oral   Take 1 tablet by mouth every 4 (four) hours as needed.         Marland Kitchen oxyCODONE-acetaminophen (PERCOCET) 10-325 MG per tablet   Oral   Take 1 tablet by mouth every 4 (four) hours as needed for pain.   30 tablet   0   . PATANOL 0.1 % ophthalmic solution   Both Eyes   Place 1 drop into both eyes. qd         . PHENYTEK 300 MG ER capsule   Oral   Take 1 capsule by mouth 3 (three) times daily.          . polyethylene glycol (MIRALAX /  GLYCOLAX) packet      daily. 4-8oz daily         . rizatriptan (MAXALT-MLT) 10 MG disintegrating tablet   Oral   Take 1 tablet (10 mg total) by mouth 3 (three) times daily as needed for migraine. May repeat in 2 hours if needed   12 tablet   5   . topiramate (TOPAMAX) 100 MG tablet   Oral   Take 1 tablet (100 mg total) by mouth 2 (two) times daily.   60 tablet   5   . zolpidem (AMBIEN) 5 MG tablet   Oral   Take 1 tablet by mouth at bedtime as needed and may repeat dose one time if needed.         Marland Kitchen HYDROcodone-acetaminophen (NORCO/VICODIN) 5-325 MG per tablet   Oral   Take 2 tablets by mouth every 4 (four) hours as needed for pain.   6 tablet   0   . metoCLOPramide (REGLAN) 10 MG tablet   Oral   Take 1 tablet (10 mg total) by mouth every 6 (six) hours as needed (nausea/headache).   6 tablet   0   . oxyCODONE-acetaminophen (PERCOCET) 10-325 MG per tablet      Take one to two tablets by mouth every six to eight hours as needed for pain .   60 tablet   0   . predniSONE (DELTASONE) 5 MG tablet      Began taking 6 tablets daily, taper by one tablet daily until off the medication.   21 tablet   0    BP 154/103  Pulse 93  Temp(Src) 98.7 F (37.1 C) (Oral)  Resp 20  Ht 5\' 9"  (1.753 m)  Wt 154 lb (69.854 kg)  BMI 22.73 kg/m2  SpO2 100% Physical Exam  Nursing note and vitals reviewed. Constitutional: She is oriented  to person, place, and time. She appears well-developed and well-nourished. No distress.  HENT:  Head: Normocephalic and atraumatic.  Eyes: EOM are normal.  Neck: Normal range of motion. Neck supple.  Cardiovascular: Normal rate.   Pulmonary/Chest: Effort normal.  Musculoskeletal:       Right foot: She exhibits tenderness. She exhibits normal range of motion and normal capillary refill. Swelling: miniml.       Feet:  Healing suture line without signs of infection. Minimal swelling and ecchymosis noted to dorsum of foot surrounding the suture  line.   Neurological: She is alert and oriented to person, place, and time. No cranial nerve deficit or sensory deficit.  Pedal pulse strong, adequate circulation.  Skin: Skin is warm and dry.  Psychiatric: She has a normal mood and affect. Her behavior is normal.   Dg Foot Complete Right  11/15/2013   CLINICAL DATA:  History of fall complaining of right foot pain.  EXAM: RIGHT FOOT COMPLETE - 3+ VIEW  COMPARISON:  Right foot radiograph 11/11/2013.  FINDINGS: Postoperative changes of bunionectomy are noted. There is also been osteotomy of the 1st proximal phalanx. No significant change in these postoperative findings compared to prior study 11/11/2013. No acute displaced fracture, subluxation or dislocation is noted.  IMPRESSION: 1. No acute radiographic abnormality of the right foot. Postoperative changes, as above, similar to the prior study.   Electronically Signed   By: Vinnie Langton M.D.   On: 11/15/2013 20:09     ED Course  Procedures  MDM  38 y.o. female with pain to right foot s/p injury. She will follow up with her orthopedic doctor tomorrow. She will continue her pain medications and care as directed by her surgeon.  Discussed with the patient and all questioned fully answered.   [place], [place] 11/16/13 1943

## 2013-11-15 NOTE — ED Notes (Signed)
Pt refused to put on a gown. 

## 2013-11-17 ENCOUNTER — Encounter: Payer: Self-pay | Admitting: Podiatry

## 2013-11-17 NOTE — ED Provider Notes (Signed)
Medical screening examination/treatment/procedure(s) were performed by non-physician practitioner and as supervising physician I was immediately available for consultation/collaboration.  EKG Interpretation   None        Orlie Dakin, MD 11/17/13 [id number]

## 2013-11-17 NOTE — Progress Notes (Signed)
[place] Osteotomy with screw right foot Aiken Osteotomy with screw right foot

## 2013-11-18 ENCOUNTER — Encounter: Payer: Self-pay | Admitting: Podiatry

## 2013-11-18 ENCOUNTER — Ambulatory Visit (INDEPENDENT_AMBULATORY_CARE_PROVIDER_SITE_OTHER): Payer: Medicare Other | Admitting: Podiatry

## 2013-11-18 VITALS — BP 127/89 | HR 85 | Temp 98.6°F | Resp 16

## 2013-11-18 DIAGNOSIS — Z9889 Other specified postprocedural states: Secondary | ICD-10-CM

## 2013-11-18 NOTE — Progress Notes (Signed)
   Subjective:    Patient ID: Molly [name], female    DOB: Sep 19, 1976, 38 y.o.   MRN: [id number]  HPI Comments: Routine post op , surgical check #2 , right foot austin bun repair, pt states she had fallen over the weekend and thought she had messed up foot , went to er and xray was fine.  Still hurting      Review of Systems     Objective:   Physical Exam: Vital signs are stable she is alert and oriented x3. She's allowed and days postop Akin osteotomy an Austin osteotomy right foot. Stating that the pain seems to be improving.  Vital signs are stable she is alert and oriented x3 dry sterile dressing intact demonstrates no erythema edema cellulitis drainage or odor once his removed. She has better range of motion this week than she did last week. No signs of infection.        Assessment & Plan:  Assessment: Well-healing surgical foot right.  Plan: At this point I redressed her with a dry sterile compressive dressing just to get the remainder of the swelling out encouraged range of motion exercises. She'll continue to use her crutches until her foot feels a little better. And I will followup with her in one week.

## 2013-11-20 ENCOUNTER — Telehealth: Payer: Self-pay | Admitting: *Deleted

## 2013-11-20 NOTE — Telephone Encounter (Signed)
Pt states the last dressing Dr Milinda Pointer put on is uncomfortable, and the boot is rubbing her heel.  I told pt to either nick the stretchy or remove the coflex, ice and elevate the foot.  Pt states she doesn't feel comfortable removing the coflex, so will nick it and continue to ice 20 minutes per hour.

## 2013-11-25 ENCOUNTER — Encounter: Payer: Medicare Other | Admitting: Podiatry

## 2013-11-27 ENCOUNTER — Ambulatory Visit (INDEPENDENT_AMBULATORY_CARE_PROVIDER_SITE_OTHER): Payer: Medicare Other

## 2013-11-27 ENCOUNTER — Telehealth: Payer: Self-pay | Admitting: *Deleted

## 2013-11-27 ENCOUNTER — Ambulatory Visit (INDEPENDENT_AMBULATORY_CARE_PROVIDER_SITE_OTHER): Payer: Medicare Other | Admitting: Podiatry

## 2013-11-27 ENCOUNTER — Encounter: Payer: Self-pay | Admitting: Podiatry

## 2013-11-27 VITALS — BP 140/96 | HR 99 | Resp 18

## 2013-11-27 DIAGNOSIS — M2010 Hallux valgus (acquired), unspecified foot: Secondary | ICD-10-CM

## 2013-11-27 DIAGNOSIS — Z9889 Other specified postprocedural states: Secondary | ICD-10-CM

## 2013-11-27 MED ORDER — OXYCODONE-ACETAMINOPHEN 10-325 MG PO TABS: 1.0000 | ORAL_TABLET | ORAL | Status: DC | PRN

## 2013-11-27 MED ORDER — ECONAZOLE NITRATE 1 % EX CREA: TOPICAL_CREAM | Freq: Every day | CUTANEOUS | Status: DC

## 2013-11-27 MED ORDER — AMMONIUM LACTATE 12 % EX LOTN: 1.0000 "application " | TOPICAL_LOTION | CUTANEOUS | Status: DC | PRN

## 2013-11-27 NOTE — Telephone Encounter (Signed)
PT CALLED STATING SINCE SHE LEFT OFFICE SHE IS IN A LOT OF PAIN , SHE IS IN A SURGICAL SHOE AND IS TO PUT PRESSURE ON FOOT, TOLD HER TO WEAR HER BOOT WHILE WALKING AND DR HYATT REFILLED HER PRESCRIPTION FOR PAIN MEDS

## 2013-11-27 NOTE — Progress Notes (Signed)
Routine post op , right foot aust bun repair , pt states it burns some , still cant put no pressure on it. Denies trauma to the foot.  Objective: Vital signs are stable she is alert and oriented x3. She presents today utilizing her Cam Walker and crutches. Dressing was removed demonstrates minimal edema no erythema saline is drainage or odor sutures are intact and appear to be healing quite nicely radiographic evaluation of the right foot demonstrates an Akin osteotomy that appears to be healing as well as a capital osteotomy to the first metatarsal of the right foot. I see no signs of infection. He remains be some mild extensus of the hallux secondary to her use of the crutches. She has minimal tenderness on range of motion of the first metatarsophalangeal joint is to be doing much better than the last time I saw her. Little tenderness on palpation of the mid diaphyseal region of the proximal phalanx.  Assessment: Slowly healing Austin bunionectomy with Akin osteotomy right foot.  Plan: Discussed etiology pathology conservative versus surgical therapies. I encouraged her to start putting some weight on this foot and to try not to favor the foot. I did put her in a compression anklet as well as a Darco shoe and I will followup with her in 2 weeks for another set of x-rays

## 2013-12-05 ENCOUNTER — Ambulatory Visit (INDEPENDENT_AMBULATORY_CARE_PROVIDER_SITE_OTHER): Payer: Medicare Other | Admitting: Family

## 2013-12-05 ENCOUNTER — Encounter: Payer: Self-pay | Admitting: Family

## 2013-12-05 ENCOUNTER — Other Ambulatory Visit: Payer: Self-pay | Admitting: Family

## 2013-12-05 VITALS — BP 116/88 | HR 97 | Ht 69.0 in | Wt 154.0 lb

## 2013-12-05 DIAGNOSIS — M549 Dorsalgia, unspecified: Secondary | ICD-10-CM

## 2013-12-05 DIAGNOSIS — K5900 Constipation, unspecified: Secondary | ICD-10-CM | POA: Diagnosis not present

## 2013-12-05 DIAGNOSIS — G5600 Carpal tunnel syndrome, unspecified upper limb: Secondary | ICD-10-CM | POA: Insufficient documentation

## 2013-12-05 DIAGNOSIS — K219 Gastro-esophageal reflux disease without esophagitis: Secondary | ICD-10-CM | POA: Diagnosis not present

## 2013-12-05 DIAGNOSIS — M797 Fibromyalgia: Secondary | ICD-10-CM | POA: Insufficient documentation

## 2013-12-05 DIAGNOSIS — M1990 Unspecified osteoarthritis, unspecified site: Secondary | ICD-10-CM | POA: Insufficient documentation

## 2013-12-05 DIAGNOSIS — F319 Bipolar disorder, unspecified: Secondary | ICD-10-CM

## 2013-12-05 DIAGNOSIS — G8929 Other chronic pain: Secondary | ICD-10-CM

## 2013-12-05 DIAGNOSIS — G40909 Epilepsy, unspecified, not intractable, without status epilepticus: Secondary | ICD-10-CM

## 2013-12-05 DIAGNOSIS — IMO0001 Reserved for inherently not codable concepts without codable children: Secondary | ICD-10-CM

## 2013-12-05 LAB — CBC WITH DIFFERENTIAL/PLATELET
Basophils Absolute: 0 10*3/uL (ref 0.0–0.1)
Basophils Relative: 0.3 % (ref 0.0–3.0)
Eosinophils Absolute: 0.1 10*3/uL (ref 0.0–0.7)
Eosinophils Relative: 1.1 % (ref 0.0–5.0)
HCT: 34.8 % — ABNORMAL LOW (ref 36.0–46.0)
Hemoglobin: 11.5 g/dL — ABNORMAL LOW (ref 12.0–15.0)
Lymphocytes Relative: 31.8 % (ref 12.0–46.0)
Lymphs Abs: 2.1 10*3/uL (ref 0.7–4.0)
MCHC: 33.1 g/dL (ref 30.0–36.0)
MCV: 93.6 fl (ref 78.0–100.0)
MONO ABS: 0.4 10*3/uL (ref 0.1–1.0)
Monocytes Relative: 6.5 % (ref 3.0–12.0)
NEUTROS PCT: 60.3 % (ref 43.0–77.0)
Neutro Abs: 3.9 10*3/uL (ref 1.4–7.7)
PLATELETS: 253 10*3/uL (ref 150.0–400.0)
RBC: 3.72 Mil/uL — ABNORMAL LOW (ref 3.87–5.11)
RDW: 13.3 % (ref 11.5–14.6)
WBC: 6.5 10*3/uL (ref 4.5–10.5)

## 2013-12-05 LAB — COMPREHENSIVE METABOLIC PANEL WITH GFR
ALT: 42 U/L — ABNORMAL HIGH (ref 0–35)
AST: 25 U/L (ref 0–37)
Albumin: 4 g/dL (ref 3.5–5.2)
Alkaline Phosphatase: 72 U/L (ref 39–117)
BUN: 12 mg/dL (ref 6–23)
CO2: 31 meq/L (ref 19–32)
Calcium: 9.5 mg/dL (ref 8.4–10.5)
Chloride: 102 meq/L (ref 96–112)
Creatinine, Ser: 0.9 mg/dL (ref 0.4–1.2)
GFR: 87.07 mL/min
Glucose, Bld: 78 mg/dL (ref 70–99)
Potassium: 3.7 meq/L (ref 3.5–5.1)
Sodium: 139 meq/L (ref 135–145)
Total Bilirubin: 0.6 mg/dL (ref 0.3–1.2)
Total Protein: 7.6 g/dL (ref 6.0–8.3)

## 2013-12-05 LAB — TSH: TSH: 1.7 u[IU]/mL (ref 0.35–5.50)

## 2013-12-05 MED ORDER — MIRTAZAPINE 30 MG PO TABS: 30.0000 mg | ORAL_TABLET | Freq: Every day | ORAL | Status: DC

## 2013-12-05 NOTE — Progress Notes (Signed)
Subjective:    Patient ID: Molly [name], female    DOB: 07/31/1976, 38 y.o.   MRN: [id number]  HPI  38 year old [place] American female, nonsmoker who recently relocated to the [place] area is and to be established. She is chronically disabled due to a history of seizure disorder, migraine headaches, carpal tunnel syndrome, fibromyalgia, bipolar disorder, chronic low back pain, and GERD. She recently had surgery on her right foot and is currently under the care of podiatry. She is unsure what the exact procedure was. Denies any active concerns periods requesting referral to the pain clinic and gastroenterology. She's currently stable on medications. She is a single mother with 2 children ages 57 and 98. Reports having difficulty control of bipolar disorder at times. Her mother and sister helped her a lot with activities of daily living as well as decision-making. She reports being forgetful during episodes of mania. She typically pierces her body during manic episodes. Currently has piercings to her tongue, has pierced her nipples, navel, and vagina. Reports she's been trying to refrain from tattooing. However, when she is in pain, pain takes the pain away. She is requesting to be established with a therapist here in [place]. Relocating from [place], [place].  Review of Systems  Constitutional: Negative.   HENT: Negative.   Eyes: Negative.   Respiratory: Negative.   Cardiovascular: Negative.   Gastrointestinal: Negative.   Genitourinary: Negative.   Musculoskeletal: Positive for arthralgias, back pain and myalgias.  Skin: Negative.   Allergic/Immunologic: Negative.   Neurological:       Carpal tunnel syndrome, seizure disorder  Psychiatric/Behavioral: Positive for sleep disturbance and agitation. The patient is nervous/anxious.    Past Medical History  Diagnosis Date  . Migraine   . GERD (gastroesophageal reflux disease)   . Lumbago   . Seizures     last seizure nov 2014     History   Social History  . Marital Status: Single    Spouse Name: N/A    Number of Children: N/A  . Years of Education: N/A   Occupational History  . Not on file.   Social History Main Topics  . Smoking status: Never Smoker   . Smokeless tobacco: Never Used  . Alcohol Use: No  . Drug Use: No  . Sexual Activity: Not on file   Other Topics Concern  . Not on file   Social History Narrative  . No narrative on file    Past Surgical History  Procedure Laterality Date  . Abdominal hysterectomy    . Tubal ligation    . Foot surgery      bilateral, toe surgery  . Bunionectomy Right 11-07-2013  . Aiken osteotomy Right 11-07-2013    Family History  Problem Relation Age of Onset  . Seizures Mother   . Emphysema Father   . Seizures Maternal Grandmother   . Seizures Sister   . Bipolar disorder Sister     Allergies  Allergen Reactions  . Phenergan [Promethazine Hcl] Swelling  . Toradol [Ketorolac Tromethamine] Hives    Current Outpatient Prescriptions on File Prior to Visit  Medication Sig Dispense Refill  . ammonium lactate (AMLACTIN) 12 % lotion Apply 1 application topically as needed for dry skin.  400 g  5  . cefadroxil (DURICEF) 500 MG capsule Take 500 mg by mouth 2 (two) times daily.      . cetirizine (ZYRTEC) 10 MG tablet Take 10 mg by mouth daily.      . cholecalciferol (VITAMIN D)  1000 UNITS tablet Take 1,000 Units by mouth daily.      . cyclobenzaprine (FLEXERIL) 10 MG tablet Take 1 tablet by mouth 3 (three) times daily.      Marland Kitchen dicyclomine (BENTYL) 10 MG capsule Take 1 capsule by mouth 2 (two) times daily.      . divalproex (DEPAKOTE) 500 MG DR tablet Take 1,000 mg by mouth at bedtime as needed and may repeat dose one time if needed.      Marland Kitchen econazole nitrate 1 % cream Apply topically daily.  85 g  5  . escitalopram (LEXAPRO) 10 MG tablet Take 1 tablet by mouth daily.      . fluticasone (FLONASE) 50 MCG/ACT nasal spray Place 2 sprays into the nose daily.       Marland Kitchen HYDROcodone-acetaminophen (NORCO/VICODIN) 5-325 MG per tablet Take 2 tablets by mouth every 4 (four) hours as needed for pain.  6 tablet  0  . ibuprofen (ADVIL,MOTRIN) 800 MG tablet Take 800 mg by mouth every 8 (eight) hours as needed.      . metoCLOPramide (REGLAN) 10 MG tablet Take 1 tablet (10 mg total) by mouth every 6 (six) hours as needed (nausea/headache).  6 tablet  0  . Multiple Vitamin (MULTIVITAMIN) capsule Take 1 capsule by mouth daily.      . naproxen (NAPRELAN) 500 MG 24 hr tablet Take 750 mg by mouth daily with breakfast.      . NEXIUM 20 MG capsule Take 1 capsule by mouth daily.      . ondansetron (ZOFRAN) 8 MG tablet Take 1 tablet by mouth 3 (three) times daily.      Marland Kitchen oxyCODONE (OXY IR/ROXICODONE) 5 MG immediate release tablet Take 1 tablet by mouth every 4 (four) hours as needed.      Marland Kitchen oxyCODONE-acetaminophen (PERCOCET) 10-325 MG per tablet Take one to two tablets by mouth every six to eight hours as needed for pain .  60 tablet  0  . oxyCODONE-acetaminophen (PERCOCET) 10-325 MG per tablet Take 1 tablet by mouth every 4 (four) hours as needed for pain.  30 tablet  0  . PATANOL 0.1 % ophthalmic solution Place 1 drop into both eyes. qd      . PHENYTEK 300 MG ER capsule Take 1 capsule by mouth 3 (three) times daily.       . polyethylene glycol (MIRALAX / GLYCOLAX) packet daily. 4-8oz daily      . rizatriptan (MAXALT-MLT) 10 MG disintegrating tablet Take 1 tablet (10 mg total) by mouth 3 (three) times daily as needed for migraine. May repeat in 2 hours if needed  12 tablet  5  . topiramate (TOPAMAX) 100 MG tablet Take 1 tablet (100 mg total) by mouth 2 (two) times daily.  60 tablet  5  . zolpidem (AMBIEN) 5 MG tablet Take 1 tablet by mouth at bedtime as needed and may repeat dose one time if needed.      . predniSONE (DELTASONE) 5 MG tablet Began taking 6 tablets daily, taper by one tablet daily until off the medication.  21 tablet  0   No current facility-administered  medications on file prior to visit.    BP 116/88  Pulse 97  Ht 5\' 9"  (1.753 m)  Wt 154 lb (69.854 kg)  BMI 22.73 kg/m2chart    Objective:   Physical Exam  Constitutional: She is oriented to person, place, and time. She appears well-developed and well-nourished.  Neck: Normal range of motion. Neck supple.  Cardiovascular:  Normal rate, regular rhythm and normal heart sounds.   Pulmonary/Chest: Effort normal and breath sounds normal.  Abdominal: Soft. Bowel sounds are normal.  Musculoskeletal: She exhibits no edema and no tenderness.  Walking with crutches. Metaboot on right foot.  Neurological: She is alert and oriented to person, place, and time.  Skin: Skin is warm and dry.  Psychiatric: She has a normal mood and affect.          Assessment & Plan:  Assessment:  1. Fibromyalgia-continue current medications 2. Osteoarthritis-continue current anti-inflammatories and pain medication 3. Chronic Low Back Pain-refer to pain clinic for further management. 4. Bipolar disorder-established relationship with Richardo Priest, therapist. See psychiatry as scheduled. 5. Seizure disorder-followup in neurology. Continue current medications. 6. Chronic constipation-continue current medication. Referred to gastroenterology to rule out Crohn's disease. I don't see any documentation on official diagnosis. Patient reported. 7. Carpal tunnel syndrome-under the care of neurology. Followup as scheduled.  Follow up patient for complete physical exam at next office visit. Medications renewed today. Call the office if any questions or concerns.

## 2013-12-05 NOTE — Patient Instructions (Signed)

## 2013-12-05 NOTE — Progress Notes (Signed)
Pre visit review using our clinic review tool, if applicable. No additional management support is needed unless otherwise documented below in the visit note. 

## 2013-12-09 ENCOUNTER — Encounter: Payer: Self-pay | Admitting: Podiatry

## 2013-12-09 ENCOUNTER — Telehealth: Payer: Self-pay | Admitting: *Deleted

## 2013-12-09 ENCOUNTER — Ambulatory Visit (INDEPENDENT_AMBULATORY_CARE_PROVIDER_SITE_OTHER): Payer: Medicare Other

## 2013-12-09 ENCOUNTER — Ambulatory Visit (INDEPENDENT_AMBULATORY_CARE_PROVIDER_SITE_OTHER): Payer: Medicare Other | Admitting: Podiatry

## 2013-12-09 VITALS — BP 124/84 | HR 88 | Resp 16

## 2013-12-09 DIAGNOSIS — Z9889 Other specified postprocedural states: Secondary | ICD-10-CM

## 2013-12-09 MED ORDER — OXYCODONE-ACETAMINOPHEN 5-325 MG PO TABS: 1.0000 | ORAL_TABLET | Freq: Four times a day (QID) | ORAL | Status: DC | PRN

## 2013-12-09 NOTE — Progress Notes (Signed)
Post op 12.26.14 aust bun repair , still have pain top the tip and the toe. Pain on the left side of the ankle, when putting direct pressure get light headed .  Vital signs are stable she is alert and oriented x3. She ambulates today without crutches. Cam Walker is intact. Evaluation of the right foot today demonstrates much decrease in edema good range of motion of the first metatarsophalangeal joint the tender on range of motion states it was throbbing afterwards. Radiographs evaluation demonstrates well-healing surgical foot Akin osteotomy an Austin bunion repair right foot. She's also having some medial ankle pain more than likely associated with the boot and lateral ankle pain and dorsal lateral foot pain both more likely associated with a day.  Assessment: Well-healing surgical foot.  Plan: Discussed etiology pathology conservative versus surgical therapies. She was dispensed more pain medication. We also discussed physical therapy range of motion exercises as well as contrast baths.

## 2013-12-09 NOTE — Telephone Encounter (Signed)
Pt request pain medication to be called to [facility].  I left message to pick the pain medication up in the [place] office.  Dr Milinda Pointer ordered Percocet 5/325 #20 one tablet by mouth every 6 hours prn pain.

## 2013-12-10 ENCOUNTER — Other Ambulatory Visit: Payer: Self-pay | Admitting: Family

## 2013-12-10 ENCOUNTER — Telehealth: Payer: Self-pay | Admitting: Family

## 2013-12-10 ENCOUNTER — Other Ambulatory Visit: Payer: Self-pay

## 2013-12-10 DIAGNOSIS — H538 Other visual disturbances: Secondary | ICD-10-CM

## 2013-12-10 MED ORDER — TOPIRAMATE 100 MG PO TABS: 100.0000 mg | ORAL_TABLET | Freq: Two times a day (BID) | ORAL | Status: DC

## 2013-12-10 NOTE — Telephone Encounter (Signed)
Ok to refer.

## 2013-12-10 NOTE — Telephone Encounter (Signed)
Please advise 

## 2013-12-10 NOTE — Telephone Encounter (Signed)
Pt would like referral for dermatology. Pt states she called [facility] in [place]. [phone] and they states they would need referral Pt states she would rather have md in [place] if possible. Pt has medicare and medicaid

## 2013-12-11 ENCOUNTER — Telehealth: Payer: Self-pay | Admitting: *Deleted

## 2013-12-11 NOTE — Telephone Encounter (Signed)
Pt asked how many times she's suppose to perform the nerve bathes and how many times the sets of hot and cold.  I told her twice daily at least and 5 sets.

## 2013-12-12 ENCOUNTER — Other Ambulatory Visit: Payer: Self-pay

## 2013-12-16 ENCOUNTER — Encounter: Payer: Medicare Other | Admitting: Podiatry

## 2013-12-16 ENCOUNTER — Other Ambulatory Visit: Payer: Self-pay

## 2013-12-16 DIAGNOSIS — D489 Neoplasm of uncertain behavior, unspecified: Secondary | ICD-10-CM

## 2013-12-17 ENCOUNTER — Encounter: Payer: Self-pay | Admitting: Gastroenterology

## 2013-12-23 ENCOUNTER — Ambulatory Visit (INDEPENDENT_AMBULATORY_CARE_PROVIDER_SITE_OTHER): Payer: Medicare Other

## 2013-12-23 ENCOUNTER — Encounter: Payer: Self-pay | Admitting: Podiatry

## 2013-12-23 ENCOUNTER — Ambulatory Visit (INDEPENDENT_AMBULATORY_CARE_PROVIDER_SITE_OTHER): Payer: Medicare Other | Admitting: Podiatry

## 2013-12-23 VITALS — BP 123/89 | HR 85 | Resp 16

## 2013-12-23 DIAGNOSIS — Z9889 Other specified postprocedural states: Secondary | ICD-10-CM

## 2013-12-23 MED ORDER — OXYCODONE-ACETAMINOPHEN 10-325 MG PO TABS: ORAL_TABLET | ORAL | Status: DC

## 2013-12-23 NOTE — Progress Notes (Signed)
Post op 12.26.14 aus bun repair , still have burning when putting pressure on it and pain in the middle bottom of foot and the 3rd toe .  Objective: Vital signs are stable she is alert and oriented x3. She is status post [facility] and a Akin osteotomy. She continues her hot and cold contrast baths and states that her foot hurts to put direct pressure on it. She is able to walk pain-free with the boot but without the boot she states it hurts terribly. She continues her narcotics daily.  Assessment: Well-healing Austin bunion repair Akin osteotomy considerable pain and tenderness outside of the normal for this procedure.  Plan: Physical therapy was prescribed today she will start that immediately and a refill her narcotics I will followup with her in one month

## 2013-12-24 DIAGNOSIS — M25579 Pain in unspecified ankle and joints of unspecified foot: Secondary | ICD-10-CM | POA: Diagnosis not present

## 2013-12-25 DIAGNOSIS — M25579 Pain in unspecified ankle and joints of unspecified foot: Secondary | ICD-10-CM | POA: Diagnosis not present

## 2013-12-29 DIAGNOSIS — M25579 Pain in unspecified ankle and joints of unspecified foot: Secondary | ICD-10-CM | POA: Diagnosis not present

## 2013-12-31 DIAGNOSIS — M25579 Pain in unspecified ankle and joints of unspecified foot: Secondary | ICD-10-CM | POA: Diagnosis not present

## 2014-01-01 DIAGNOSIS — L819 Disorder of pigmentation, unspecified: Secondary | ICD-10-CM | POA: Diagnosis not present

## 2014-01-01 DIAGNOSIS — D485 Neoplasm of uncertain behavior of skin: Secondary | ICD-10-CM | POA: Diagnosis not present

## 2014-01-06 ENCOUNTER — Telehealth: Payer: Self-pay | Admitting: *Deleted

## 2014-01-06 DIAGNOSIS — M25579 Pain in unspecified ankle and joints of unspecified foot: Secondary | ICD-10-CM | POA: Diagnosis not present

## 2014-01-06 NOTE — Telephone Encounter (Signed)
Pt states Physical therapist request she have a TENS unit and pads.

## 2014-01-07 NOTE — Telephone Encounter (Signed)
Ok to write for that.  thanks

## 2014-01-08 NOTE — Telephone Encounter (Signed)
Informed pt, Dr Milinda Pointer ordered TENS and pads for her to [place] through PT.

## 2014-01-12 ENCOUNTER — Other Ambulatory Visit: Payer: Self-pay | Admitting: Family

## 2014-01-13 ENCOUNTER — Other Ambulatory Visit: Payer: Self-pay | Admitting: Family

## 2014-01-13 ENCOUNTER — Telehealth: Payer: Self-pay | Admitting: *Deleted

## 2014-01-13 NOTE — Telephone Encounter (Signed)
Rx For TENS and Supplies faxed to [place].

## 2014-01-14 DIAGNOSIS — M25579 Pain in unspecified ankle and joints of unspecified foot: Secondary | ICD-10-CM | POA: Diagnosis not present

## 2014-01-15 DIAGNOSIS — M25579 Pain in unspecified ankle and joints of unspecified foot: Secondary | ICD-10-CM | POA: Diagnosis not present

## 2014-01-19 ENCOUNTER — Ambulatory Visit: Payer: Medicare Other | Admitting: Gastroenterology

## 2014-01-20 ENCOUNTER — Ambulatory Visit (INDEPENDENT_AMBULATORY_CARE_PROVIDER_SITE_OTHER): Payer: Medicare Other | Admitting: Podiatry

## 2014-01-20 ENCOUNTER — Ambulatory Visit (INDEPENDENT_AMBULATORY_CARE_PROVIDER_SITE_OTHER): Payer: Medicare Other

## 2014-01-20 ENCOUNTER — Encounter: Payer: Self-pay | Admitting: Podiatry

## 2014-01-20 VITALS — BP 133/97 | HR 90 | Resp 16

## 2014-01-20 DIAGNOSIS — Z9889 Other specified postprocedural states: Secondary | ICD-10-CM | POA: Diagnosis not present

## 2014-01-20 MED ORDER — OXYCODONE-ACETAMINOPHEN 10-325 MG PO TABS: 1.0000 | ORAL_TABLET | ORAL | Status: DC | PRN

## 2014-01-20 MED ORDER — MELOXICAM 15 MG PO TABS: 15.0000 mg | ORAL_TABLET | Freq: Every day | ORAL | Status: DC

## 2014-01-20 NOTE — Progress Notes (Signed)
Right foot 12.26.14 bunion repair , its about 50/50. She continues physical therapy she says.  Objective: Vital signs are stable she is alert and oriented x3. Pulses are strongly palpable bilateral. Much decrease in edema to the right foot status post Austin Akin osteotomy. Radiographic evaluation demonstrates well-healing surgical foot. She still has lack of plantarflexion very good dorsiflexion.  Assessment: Well-healing surgical foot status post Akin Austin bunion repair 11/07/2013.  Plan: Encouraged range of motion exercises encouraged physical therapy I wrote her a prescription for MOBIC 15 mg 1 by mouth daily and also wrote her a prescription for Percocet.

## 2014-01-21 DIAGNOSIS — M25579 Pain in unspecified ankle and joints of unspecified foot: Secondary | ICD-10-CM | POA: Diagnosis not present

## 2014-01-23 ENCOUNTER — Telehealth: Payer: Self-pay | Admitting: Family

## 2014-01-23 ENCOUNTER — Telehealth: Payer: Self-pay | Admitting: *Deleted

## 2014-01-23 NOTE — Telephone Encounter (Signed)
Formed request 4 month supplies for TENS Unit faxed back to [facility] with DX code [phone], signed by Dr. Milinda Pointer.

## 2014-01-23 NOTE — Telephone Encounter (Signed)
Pt is calling in regards to disability discharge form that Molly [name] filled out, pt states that her school could not understand the handwriting on the paperwork and need a copy of the license # for Molly [name] faxed to them. [phone] fax# attn: to [id number]

## 2014-01-26 NOTE — Telephone Encounter (Signed)
Note faxed with PCP's license and DEA #'s

## 2014-01-27 DIAGNOSIS — M25579 Pain in unspecified ankle and joints of unspecified foot: Secondary | ICD-10-CM | POA: Diagnosis not present

## 2014-01-28 DIAGNOSIS — M25579 Pain in unspecified ankle and joints of unspecified foot: Secondary | ICD-10-CM | POA: Diagnosis not present

## 2014-02-03 DIAGNOSIS — M25579 Pain in unspecified ankle and joints of unspecified foot: Secondary | ICD-10-CM | POA: Diagnosis not present

## 2014-02-05 DIAGNOSIS — M25579 Pain in unspecified ankle and joints of unspecified foot: Secondary | ICD-10-CM | POA: Diagnosis not present

## 2014-02-09 ENCOUNTER — Other Ambulatory Visit: Payer: Self-pay | Admitting: Family

## 2014-02-10 DIAGNOSIS — M25579 Pain in unspecified ankle and joints of unspecified foot: Secondary | ICD-10-CM | POA: Diagnosis not present

## 2014-02-12 DIAGNOSIS — M25579 Pain in unspecified ankle and joints of unspecified foot: Secondary | ICD-10-CM | POA: Diagnosis not present

## 2014-02-17 ENCOUNTER — Encounter: Payer: Self-pay | Admitting: Podiatry

## 2014-02-17 ENCOUNTER — Ambulatory Visit (INDEPENDENT_AMBULATORY_CARE_PROVIDER_SITE_OTHER): Payer: Medicare Other | Admitting: Podiatry

## 2014-02-17 ENCOUNTER — Ambulatory Visit (INDEPENDENT_AMBULATORY_CARE_PROVIDER_SITE_OTHER): Payer: Medicare Other

## 2014-02-17 VITALS — BP 120/83 | HR 86 | Resp 16

## 2014-02-17 DIAGNOSIS — Z9889 Other specified postprocedural states: Secondary | ICD-10-CM

## 2014-02-17 NOTE — Progress Notes (Signed)
Molly [name] he presents today for a followup of her Molly [name] bunion repair right and her Akin osteotomy right foot. She is currently in physical therapy and she's doing much better. Her pain has reduced considerably and she has much better range of motion to the first metatarsophalangeal joint. The hallux remains mildly dorsiflexed with plantar flexion at the IP joint.  Objective: Vital signs are stable she is alert and oriented x3. Pulses are strongly palpable. Much decrease in edema first metatarsophalangeal joint and right foot. Radiographic evaluation demonstrates well-healing osteotomies.  Assessment: Well-healing surgical foot right.  Plan: Continue physical therapy I will followup with her in 6 weeks

## 2014-02-18 DIAGNOSIS — M25579 Pain in unspecified ankle and joints of unspecified foot: Secondary | ICD-10-CM | POA: Diagnosis not present

## 2014-02-24 DIAGNOSIS — M25579 Pain in unspecified ankle and joints of unspecified foot: Secondary | ICD-10-CM | POA: Diagnosis not present

## 2014-02-25 DIAGNOSIS — IMO0001 Reserved for inherently not codable concepts without codable children: Secondary | ICD-10-CM | POA: Diagnosis not present

## 2014-02-25 DIAGNOSIS — L723 Sebaceous cyst: Secondary | ICD-10-CM | POA: Diagnosis not present

## 2014-02-25 DIAGNOSIS — R569 Unspecified convulsions: Secondary | ICD-10-CM | POA: Diagnosis not present

## 2014-02-26 ENCOUNTER — Telehealth: Payer: Self-pay | Admitting: *Deleted

## 2014-02-26 DIAGNOSIS — M25579 Pain in unspecified ankle and joints of unspecified foot: Secondary | ICD-10-CM | POA: Diagnosis not present

## 2014-02-26 NOTE — Telephone Encounter (Signed)
I need a prescription refill on my pain medicine.  I have 2 pill left over for today.  He requested the physical therapist to move my toe 5 more degrees.  They been working me really hard this week.  I'm in a lot of pain right now.

## 2014-02-27 MED ORDER — OXYCODONE-ACETAMINOPHEN 10-325 MG PO TABS: 1.0000 | ORAL_TABLET | ORAL | Status: DC | PRN

## 2014-02-27 NOTE — Telephone Encounter (Signed)
Please refill her last pain med prescription.  Thank you.

## 2014-02-27 NOTE — Telephone Encounter (Signed)
I called and informed the patient she can come by to pick up her prescription.  Dr. Noralyn Pick the prescription, Percocet 10/325, quantity of 30, Sig: one every 4 hrs. prn pain.

## 2014-03-02 DIAGNOSIS — L708 Other acne: Secondary | ICD-10-CM | POA: Diagnosis not present

## 2014-03-02 DIAGNOSIS — D2370 Other benign neoplasm of skin of unspecified lower limb, including hip: Secondary | ICD-10-CM | POA: Diagnosis not present

## 2014-03-02 DIAGNOSIS — L723 Sebaceous cyst: Secondary | ICD-10-CM | POA: Diagnosis not present

## 2014-03-03 DIAGNOSIS — M25579 Pain in unspecified ankle and joints of unspecified foot: Secondary | ICD-10-CM | POA: Diagnosis not present

## 2014-03-04 ENCOUNTER — Ambulatory Visit (INDEPENDENT_AMBULATORY_CARE_PROVIDER_SITE_OTHER): Payer: Medicare Other | Admitting: Gastroenterology

## 2014-03-04 ENCOUNTER — Encounter: Payer: Self-pay | Admitting: Gastroenterology

## 2014-03-04 VITALS — BP 108/86 | HR 66 | Ht 67.5 in | Wt 159.0 lb

## 2014-03-04 DIAGNOSIS — K5900 Constipation, unspecified: Secondary | ICD-10-CM | POA: Diagnosis not present

## 2014-03-04 MED ORDER — LINACLOTIDE 145 MCG PO CAPS: 145.0000 ug | ORAL_CAPSULE | Freq: Every day | ORAL | Status: DC

## 2014-03-04 NOTE — Patient Instructions (Addendum)
We will get records sent from your previous gastroenterologist for review (Dr. Posey Pronto in [place]).  This will include any endoscopic (colonoscopy or upper endoscopy) procedures and any associated pathology reports.  Also Small bowel camera results. Stop taking bentyl as it can cause constipation. Samples of linzess (low dose) one pill once daily.  Call the office in 10 days to report on your response. All narcotic pain medicines can contribute to constipation, try to limit these as much as possible.
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
## 2014-03-05 DIAGNOSIS — M25579 Pain in unspecified ankle and joints of unspecified foot: Secondary | ICD-10-CM | POA: Diagnosis not present

## 2014-03-09 ENCOUNTER — Encounter: Payer: Medicare Other | Admitting: Family

## 2014-03-10 ENCOUNTER — Telehealth: Payer: Self-pay | Admitting: Gastroenterology

## 2014-03-10 DIAGNOSIS — M25579 Pain in unspecified ankle and joints of unspecified foot: Secondary | ICD-10-CM | POA: Diagnosis not present

## 2014-03-10 NOTE — Telephone Encounter (Signed)
EGD 09/2010: Dr. Earley Brooke, for abdominal pain, nausea; found "erythema in distal esophagus"  Flex sigmoidoscopy: Dr. Dahlia Byes 09/2010: done for constipation; findings normal. SBFT 04/2011 done for "persistent constipation"; normal appearing small bowel series

## 2014-03-12 ENCOUNTER — Encounter: Payer: Self-pay | Admitting: Family

## 2014-03-12 ENCOUNTER — Other Ambulatory Visit (HOSPITAL_COMMUNITY)
Admission: RE | Admit: 2014-03-12 | Discharge: 2014-03-12 | Disposition: A | Discharge status: Home / Self Care | Payer: Medicare Other | Source: Ambulatory Visit | Attending: Family | Admitting: Family

## 2014-03-12 ENCOUNTER — Ambulatory Visit (INDEPENDENT_AMBULATORY_CARE_PROVIDER_SITE_OTHER): Payer: Medicare Other | Admitting: Family

## 2014-03-12 VITALS — BP 110/84 | HR 97 | Ht 67.5 in | Wt 162.0 lb

## 2014-03-12 DIAGNOSIS — N760 Acute vaginitis: Secondary | ICD-10-CM | POA: Insufficient documentation

## 2014-03-12 DIAGNOSIS — Z0000 Encounter for general adult medical examination without abnormal findings: Secondary | ICD-10-CM | POA: Diagnosis not present

## 2014-03-12 DIAGNOSIS — Z20828 Contact with and (suspected) exposure to other viral communicable diseases: Secondary | ICD-10-CM

## 2014-03-12 DIAGNOSIS — Z79899 Other long term (current) drug therapy: Secondary | ICD-10-CM | POA: Diagnosis not present

## 2014-03-12 DIAGNOSIS — Z124 Encounter for screening for malignant neoplasm of cervix: Secondary | ICD-10-CM | POA: Diagnosis not present

## 2014-03-12 DIAGNOSIS — F319 Bipolar disorder, unspecified: Secondary | ICD-10-CM

## 2014-03-12 DIAGNOSIS — Z113 Encounter for screening for infections with a predominantly sexual mode of transmission: Secondary | ICD-10-CM | POA: Insufficient documentation

## 2014-03-12 DIAGNOSIS — Z206 Contact with and (suspected) exposure to human immunodeficiency virus [HIV]: Secondary | ICD-10-CM

## 2014-03-12 DIAGNOSIS — M25579 Pain in unspecified ankle and joints of unspecified foot: Secondary | ICD-10-CM | POA: Diagnosis not present

## 2014-03-12 DIAGNOSIS — M1990 Unspecified osteoarthritis, unspecified site: Secondary | ICD-10-CM | POA: Diagnosis not present

## 2014-03-12 DIAGNOSIS — E785 Hyperlipidemia, unspecified: Secondary | ICD-10-CM

## 2014-03-12 LAB — POCT URINALYSIS DIPSTICK
BILIRUBIN UA: NEGATIVE
Blood, UA: NEGATIVE
Glucose, UA: NEGATIVE
KETONES UA: NEGATIVE
Leukocytes, UA: NEGATIVE
Nitrite, UA: NEGATIVE
PROTEIN UA: NEGATIVE
Spec Grav, UA: 1.015
Urobilinogen, UA: 0.2
pH, UA: 7

## 2014-03-12 LAB — COMPREHENSIVE METABOLIC PANEL
ALT: 17 U/L (ref 0–35)
AST: 26 U/L (ref 0–37)
Albumin: 4 g/dL (ref 3.5–5.2)
Alkaline Phosphatase: 88 U/L (ref 39–117)
BILIRUBIN TOTAL: 0.3 mg/dL (ref 0.3–1.2)
BUN: 12 mg/dL (ref 6–23)
CO2: 29 mEq/L (ref 19–32)
CREATININE: 0.9 mg/dL (ref 0.4–1.2)
Calcium: 9.4 mg/dL (ref 8.4–10.5)
Chloride: 103 mEq/L (ref 96–112)
GFR: 91.41 mL/min (ref >60.00)
Glucose, Bld: 82 mg/dL (ref 70–99)
Potassium: 4.2 mEq/L (ref 3.5–5.1)
Sodium: 139 mEq/L (ref 135–145)
Total Protein: 7.7 g/dL (ref 6.0–8.3)

## 2014-03-12 LAB — LIPID PANEL
Cholesterol: 261 mg/dL — ABNORMAL HIGH (ref 0–200)
HDL: 65.7 mg/dL (ref >39.00)
LDL Cholesterol: 170 mg/dL — ABNORMAL HIGH (ref 0–99)
Total CHOL/HDL Ratio: 4
Triglycerides: 126 mg/dL (ref 0.0–149.0)
VLDL: 25.2 mg/dL (ref 0.0–40.0)

## 2014-03-12 LAB — CBC WITH DIFFERENTIAL/PLATELET
Basophils Absolute: 0 10*3/uL (ref 0.0–0.1)
Basophils Relative: 0.1 % (ref 0.0–3.0)
EOS ABS: 0.1 10*3/uL (ref 0.0–0.7)
Eosinophils Relative: 1.2 % (ref 0.0–5.0)
HCT: 35.5 % — ABNORMAL LOW (ref 36.0–46.0)
Hemoglobin: 12 g/dL (ref 12.0–15.0)
Lymphocytes Relative: 30.4 % (ref 12.0–46.0)
Lymphs Abs: 1.7 10*3/uL (ref 0.7–4.0)
MCHC: 33.7 g/dL (ref 30.0–36.0)
MCV: 93 fl (ref 78.0–100.0)
Monocytes Absolute: 0.3 10*3/uL (ref 0.1–1.0)
Monocytes Relative: 5.6 % (ref 3.0–12.0)
NEUTROS ABS: 3.4 10*3/uL (ref 1.4–7.7)
NEUTROS PCT: 62.7 % (ref 43.0–77.0)
Platelets: 216 10*3/uL (ref 150.0–400.0)
RBC: 3.82 Mil/uL — ABNORMAL LOW (ref 3.87–5.11)
RDW: 12.6 % (ref 11.5–14.6)
WBC: 5.5 10*3/uL (ref 4.5–10.5)

## 2014-03-12 LAB — TSH: TSH: 1.63 u[IU]/mL (ref 0.35–5.50)

## 2014-03-12 NOTE — Patient Instructions (Signed)

## 2014-03-12 NOTE — Progress Notes (Signed)
Subjective:    Patient ID: Molly [name], female    DOB: 12-Mar-1976, 38 y.o.   MRN: [id number]  HPI 38 year old AAF, nonsmoker, this is a routine wellness  examination for this patient . I reviewed all health maintenance protocols including mammography, colonoscopy, bone density. Age and diagnosis  appropriate screening labs were ordered. Her immunization history was reviewed and appropriate vaccinations were ordered. Her current medications and allergies were reviewed and needed refills of her chronic medications were ordered. The plan for yearly health maintenance was discussed all orders and referrals were made as appropriate.   Review of Systems  Constitutional: Negative.   HENT: Negative.   Eyes: Negative.   Respiratory: Negative.   Cardiovascular: Negative.   Gastrointestinal: Negative.   Endocrine: Negative.   Genitourinary: Negative.   Musculoskeletal: Negative.   Skin: Negative.   Allergic/Immunologic: Negative.   Neurological: Negative.   Hematological: Negative.   Psychiatric/Behavioral: Negative.    Past Medical History  Diagnosis Date  . Migraine   . GERD (gastroesophageal reflux disease)   . Lumbago   . Seizures     last seizure nov 2014  . Allergic rhinitis   . Fibromyalgia   . Carpal tunnel syndrome on both sides     History   Social History  . Marital Status: Single    Spouse Name: N/A    Number of Children: N/A  . Years of Education: N/A   Occupational History  . Not on file.   Social History Main Topics  . Smoking status: Never Smoker   . Smokeless tobacco: Never Used  . Alcohol Use: No  . Drug Use: No  . Sexual Activity: Not on file   Other Topics Concern  . Not on file   Social History Narrative  . No narrative on file    Past Surgical History  Procedure Laterality Date  . Abdominal hysterectomy    . Tubal ligation    . Foot surgery      bilateral, toe surgery  . Bunionectomy Right 11-07-2013  . Aiken osteotomy Right  11-07-2013    Family History  Problem Relation Age of Onset  . Seizures Mother   . Emphysema Father   . Seizures Maternal Grandmother   . Seizures Sister   . Bipolar disorder Sister     Allergies  Allergen Reactions  . Phenergan [Promethazine Hcl] Swelling  . Toradol [Ketorolac Tromethamine] Hives    Current Outpatient Prescriptions on File Prior to Visit  Medication Sig Dispense Refill  . ammonium lactate (AMLACTIN) 12 % lotion Apply 1 application topically as needed for dry skin.  400 g  5  . aspirin 325 MG tablet Take 325 mg by mouth daily.      . cetirizine (ZYRTEC) 10 MG tablet TAKE 1 TABLET (10 MG TOTAL) BY MOUTH DAILY.  30 tablet  2  . cholecalciferol (VITAMIN D) 1000 UNITS tablet Take 1,000 Units by mouth daily.      . cyclobenzaprine (FLEXERIL) 10 MG tablet Take 1 tablet by mouth 3 (three) times daily.      Marland Kitchen econazole nitrate 1 % cream Apply topically daily.  85 g  5  . escitalopram (LEXAPRO) 10 MG tablet TAKE 1 TABLET (10 MG TOTAL) BY MOUTH DAILY.  30 tablet  1  . fluticasone (FLONASE) 50 MCG/ACT nasal spray Place 2 sprays into the nose daily.      Marland Kitchen HYDROcodone-acetaminophen (NORCO/VICODIN) 5-325 MG per tablet Take 2 tablets by mouth every 4 (four) hours  as needed for pain.  6 tablet  0  . ibuprofen (ADVIL,MOTRIN) 800 MG tablet Take 800 mg by mouth every 8 (eight) hours as needed.      . Linaclotide (LINZESS) 145 MCG CAPS capsule Take 1 capsule (145 mcg total) by mouth daily.  30 capsule  0  . Magnesium Hydroxide (MILK OF MAGNESIA PO) Take 1 Package by mouth as needed.      . meloxicam (MOBIC) 15 MG tablet Take 1 tablet (15 mg total) by mouth daily.  30 tablet  3  . metoCLOPramide (REGLAN) 10 MG tablet Take 1 tablet (10 mg total) by mouth every 6 (six) hours as needed (nausea/headache).  6 tablet  0  . mirtazapine (REMERON) 30 MG tablet TAKE 1 TABLET BY MOUTH AT BEDTIME  30 tablet  1  . Multiple Vitamin (MULTIVITAMIN) capsule Take 1 capsule by mouth daily.      .  naproxen (NAPRELAN) 500 MG 24 hr tablet Take 750 mg by mouth daily with breakfast.      . NEXIUM 20 MG capsule TAKE 1 CAPSULE (20 MG TOTAL) BY MOUTH DAILY.  30 capsule  1  . ondansetron (ZOFRAN) 8 MG tablet Take 1 tablet by mouth 3 (three) times daily.      Marland Kitchen oxyCODONE (OXY IR/ROXICODONE) 5 MG immediate release tablet Take 1 tablet by mouth every 4 (four) hours as needed.      Marland Kitchen PATANOL 0.1 % ophthalmic solution PLACE 1 DROP INTO BOTH EYES 2 (TWO) TIMES DAILY.  5 mL  0  . PHENYTEK 300 MG ER capsule TAKE 1 CAPSULE BY MOUTH 3 TIMES DAILY  90 capsule  0  . polyethylene glycol (MIRALAX / GLYCOLAX) packet daily. 4-8oz daily      . rizatriptan (MAXALT-MLT) 10 MG disintegrating tablet Take 1 tablet (10 mg total) by mouth 3 (three) times daily as needed for migraine. May repeat in 2 hours if needed  12 tablet  5  . topiramate (TOPAMAX) 100 MG tablet Take 1 tablet (100 mg total) by mouth 2 (two) times daily.  180 tablet  1  . zolpidem (AMBIEN) 5 MG tablet TAKE 1 TABLET BY MOUTH AT BEDTIME AS NEEDED FOR SLEEP  30 tablet  1   No current facility-administered medications on file prior to visit.    BP 110/84  Pulse 97  Ht 5' 7.5" (1.715 m)  Wt 162 lb (73.483 kg)  BMI 24.98 kg/m2  SpO2 95%chart    Objective:   Physical Exam  Constitutional: She is oriented to person, place, and time. She appears well-developed and well-nourished.  HENT:  Head: Normocephalic and atraumatic.  Right Ear: External ear normal.  Left Ear: External ear normal.  Nose: Nose normal.  Mouth/Throat: Oropharynx is clear and moist.  Eyes: Conjunctivae and EOM are normal. Pupils are equal, round, and reactive to light.  Neck: Normal range of motion. Neck supple.  Cardiovascular: Normal rate, regular rhythm and normal heart sounds.   Pulmonary/Chest: Effort normal and breath sounds normal.  Abdominal: Soft. Bowel sounds are normal.  Genitourinary: Vagina normal.  Musculoskeletal: Normal range of motion.  Neurological: She is  alert and oriented to person, place, and time. She has normal reflexes.  Skin: Skin is warm and dry.  Psychiatric: She has a normal mood and affect.          Assessment & Plan:  Elianis was seen today for annual exam.  Diagnoses and associated orders for this visit:  Preventative health care - CMP -  POC Urinalysis Dipstick - CBC with Differential - TSH  Screening for malignant neoplasm of the cervix - PAP [[place]]  Screen for STD (sexually transmitted disease) - RPR - HSV 2 Antibody, IgG - PAP [[place]] - CBC with Differential  Bipolar disorder, unspecified - CBC with Differential - TSH  Osteoarthritis  Other and unspecified hyperlipidemia - Lipid Panel - CBC with Differential  Exposure to HIV - HIV Antibody   Call the office with any questions and concerns. Recheck in 4 months and sooner as needed.

## 2014-03-12 NOTE — Progress Notes (Signed)
Pre visit review using our clinic review tool, if applicable. No additional management support is needed unless otherwise documented below in the visit note. 

## 2014-03-13 ENCOUNTER — Other Ambulatory Visit: Payer: Self-pay | Admitting: Family

## 2014-03-13 LAB — RPR

## 2014-03-13 LAB — HIV ANTIBODY (ROUTINE TESTING W REFLEX): HIV: NONREACTIVE

## 2014-03-13 LAB — HSV 2 ANTIBODY, IGG: HSV 2 Glycoprotein G Ab, IgG: <0.1 IV

## 2014-03-13 MED ORDER — SIMVASTATIN 20 MG PO TABS: 20.0000 mg | ORAL_TABLET | Freq: Every day | ORAL | Status: DC

## 2014-03-16 ENCOUNTER — Other Ambulatory Visit: Payer: Self-pay | Admitting: Family

## 2014-03-17 DIAGNOSIS — M25579 Pain in unspecified ankle and joints of unspecified foot: Secondary | ICD-10-CM | POA: Diagnosis not present

## 2014-03-18 LAB — CERVICOVAGINAL ANCILLARY ONLY
BACTERIAL VAGINITIS: NEGATIVE
Candida vaginitis: NEGATIVE

## 2014-03-19 DIAGNOSIS — M25579 Pain in unspecified ankle and joints of unspecified foot: Secondary | ICD-10-CM | POA: Diagnosis not present

## 2014-03-24 DIAGNOSIS — M25579 Pain in unspecified ankle and joints of unspecified foot: Secondary | ICD-10-CM | POA: Diagnosis not present

## 2014-03-26 DIAGNOSIS — M25579 Pain in unspecified ankle and joints of unspecified foot: Secondary | ICD-10-CM | POA: Diagnosis not present

## 2014-03-31 ENCOUNTER — Encounter: Payer: Self-pay | Admitting: Podiatry

## 2014-03-31 ENCOUNTER — Ambulatory Visit (INDEPENDENT_AMBULATORY_CARE_PROVIDER_SITE_OTHER): Payer: Self-pay | Admitting: Podiatry

## 2014-03-31 VITALS — BP 121/90 | HR 81 | Resp 16

## 2014-03-31 DIAGNOSIS — Z9889 Other specified postprocedural states: Secondary | ICD-10-CM

## 2014-03-31 NOTE — Progress Notes (Signed)
She presents today status post Noreene Larsson osteotomy 11/07/2013. She states that they have does relate me from physical therapy. He appears to be doing quite well states that the toe still hurts on occasion. She continues her anti-inflammatory regular basis.  Objective: vital signs are stable she is alert and oriented x3 she has good range of motion of the first metatarsophalangeal joint. She has a thick hypertrophic scar with hyperpigmentation. The scar is painful for her.  Assessment: Well-healing surgical foot right.  Plan: I suggested that she continue current therapies to help decrease in thickness of the scar and increase her range of motion of the toe. I will followup with her in 2 months

## 2014-04-11 ENCOUNTER — Other Ambulatory Visit: Payer: Self-pay | Admitting: Family

## 2014-04-14 ENCOUNTER — Encounter: Payer: Self-pay | Admitting: Neurology

## 2014-04-14 ENCOUNTER — Other Ambulatory Visit: Payer: Self-pay | Admitting: Family

## 2014-04-14 ENCOUNTER — Encounter (INDEPENDENT_AMBULATORY_CARE_PROVIDER_SITE_OTHER): Payer: Self-pay

## 2014-04-14 ENCOUNTER — Ambulatory Visit (INDEPENDENT_AMBULATORY_CARE_PROVIDER_SITE_OTHER): Payer: Medicare Other | Admitting: Neurology

## 2014-04-14 VITALS — BP 118/83 | HR 92 | Wt 162.0 lb

## 2014-04-14 DIAGNOSIS — G43009 Migraine without aura, not intractable, without status migrainosus: Secondary | ICD-10-CM

## 2014-04-14 DIAGNOSIS — R569 Unspecified convulsions: Secondary | ICD-10-CM

## 2014-04-14 MED ORDER — TOPIRAMATE 100 MG PO TABS: 100.0000 mg | ORAL_TABLET | Freq: Two times a day (BID) | ORAL | Status: DC

## 2014-04-14 MED ORDER — LACOSAMIDE 50 MG PO TABS: ORAL_TABLET | ORAL | Status: DC

## 2014-04-14 NOTE — Progress Notes (Signed)
Reason for visit: Seizures  Molly [name] is an 38 y.o. female  History of present illness:  Molly [name] is a 38 year old right-handed black female with a history of chronic daily headaches and intractable seizures. When last seen, the patient was set up for EEG evaluation, blood work, and video EEG monitoring. The patient had none of these tests and procedures done. The patient refused to have EEG evaluation. The patient indicates that she has had all these studies done at [facility] in [place], [place]. The patient was on an unusually high dose of Dilantin taking 900 mg daily. The patient stopped the medication as it was making her feel poorly 2 or 3 months ago. The patient has had increasing frequency of seizures, now having 1 or 2 seizures a week. The patient continues to have her chronic daily headaches, she cannot remember the last day without any headache. The patient remains on Topamax taking 100 mg twice daily. She returns for an evaluation.  Past Medical History  Diagnosis Date  . Migraine   . GERD (gastroesophageal reflux disease)   . Lumbago   . Seizures     last seizure nov 2014  . Allergic rhinitis   . Fibromyalgia   . Carpal tunnel syndrome on both sides     Past Surgical History  Procedure Laterality Date  . Abdominal hysterectomy    . Tubal ligation    . Foot surgery      bilateral, toe surgery  . Bunionectomy Right 11-07-2013  . Aiken osteotomy Right 11-07-2013    Family History  Problem Relation Age of Onset  . Seizures Mother   . Emphysema Father   . Seizures Maternal Grandmother   . Seizures Sister   . Bipolar disorder Sister     Social history:  reports that she has never smoked. She has never used smokeless tobacco. She reports that she does not drink alcohol or use illicit drugs.    Allergies  Allergen Reactions  . Phenergan [Promethazine Hcl] Swelling  . Toradol [Ketorolac Tromethamine] Hives    Medications:  Current Outpatient  Prescriptions on File Prior to Visit  Medication Sig Dispense Refill  . ammonium lactate (AMLACTIN) 12 % lotion Apply 1 application topically as needed for dry skin.  400 g  5  . aspirin 325 MG tablet Take 325 mg by mouth daily.      . cetirizine (ZYRTEC) 10 MG tablet TAKE 1 TABLET EVERY DAY  90 tablet  2  . cholecalciferol (VITAMIN D) 1000 UNITS tablet Take 1,000 Units by mouth daily.      . cyclobenzaprine (FLEXERIL) 10 MG tablet Take 1 tablet by mouth 3 (three) times daily.      Marland Kitchen econazole nitrate 1 % cream Apply topically daily.  85 g  5  . escitalopram (LEXAPRO) 10 MG tablet TAKE 1 TABLET EVERY DAY  30 tablet  3  . fluticasone (FLONASE) 50 MCG/ACT nasal spray Place 2 sprays into the nose daily.      Marland Kitchen HYDROcodone-acetaminophen (NORCO/VICODIN) 5-325 MG per tablet Take 2 tablets by mouth every 4 (four) hours as needed for pain.  6 tablet  0  . ibuprofen (ADVIL,MOTRIN) 800 MG tablet Take 800 mg by mouth every 8 (eight) hours as needed.      . Linaclotide (LINZESS) 145 MCG CAPS capsule Take 1 capsule (145 mcg total) by mouth daily.  30 capsule  0  . Magnesium Hydroxide (MILK OF MAGNESIA PO) Take 1 Package by mouth as needed.      Marland Kitchen  meloxicam (MOBIC) 15 MG tablet Take 1 tablet (15 mg total) by mouth daily.  30 tablet  3  . metoCLOPramide (REGLAN) 10 MG tablet Take 1 tablet (10 mg total) by mouth every 6 (six) hours as needed (nausea/headache).  6 tablet  0  . mirtazapine (REMERON) 30 MG tablet TAKE 1 TABLET BY MOUTH AT BEDTIME  30 tablet  3  . Multiple Vitamin (MULTIVITAMIN) capsule Take 1 capsule by mouth daily.      . naproxen (NAPRELAN) 500 MG 24 hr tablet Take 750 mg by mouth daily with breakfast.      . NEXIUM 20 MG capsule TAKE ONE CAPSULE BY MOUTH EVERY DAY  90 capsule  1  . ondansetron (ZOFRAN) 8 MG tablet Take 1 tablet by mouth 3 (three) times daily.      Marland Kitchen PATANOL 0.1 % ophthalmic solution PLACE 1 DROP INTO BOTH EYES 2 (TWO) TIMES DAILY.  5 mL  0  . polyethylene glycol (MIRALAX /  GLYCOLAX) packet daily. 4-8oz daily      . rizatriptan (MAXALT-MLT) 10 MG disintegrating tablet Take 1 tablet (10 mg total) by mouth 3 (three) times daily as needed for migraine. May repeat in 2 hours if needed  12 tablet  5  . simvastatin (ZOCOR) 20 MG tablet Take 1 tablet (20 mg total) by mouth at bedtime.  30 tablet  1  . zolpidem (AMBIEN) 5 MG tablet TAKE 1 TABLET BY MOUTH AT BEDTIME AS NEEDED FOR SLEEP  30 tablet  1   No current facility-administered medications on file prior to visit.    ROS:  Out of a complete 14 system review of symptoms, the patient complains only of the following symptoms, and all other reviewed systems are negative.  Appetite change, fatigue Ear pain Double vision, blurred vision Excessive thirst Constipation, nausea Insomnia, frequent waking, daytime sleepiness, sleep talking, acting out dreams Food allergies Frequency of urination Joint pain, joint swelling, back pain, achy muscles, neck pain, neck stiffness Itching Anemia Memory loss, dizziness, headache, numbness, seizures, weakness, tremors, passing out Agitation, confusion, depression, anxiety  Blood pressure 118/83, pulse 92, weight 162 lb (73.483 kg).  Physical Exam  General: The patient is alert and cooperative at the time of the examination.  Skin: No significant peripheral edema is noted.   Neurologic Exam  Mental status: The Mini-Mental status examination done today shows a total score of 22/30.  Cranial nerves: Facial symmetry is present. Speech is normal, no aphasia or dysarthria is noted. Extraocular movements are full. Visual fields are full.  Motor: The patient has good strength in all 4 extremities.  Sensory examination: Soft touch sensation is symmetric on the face, arms, and legs.  Coordination: The patient has good finger-nose-finger and heel-to-shin bilaterally.  Gait and station: The patient has a normal gait. Tandem gait is normal. Romberg is negative. No drift is  seen.  Reflexes: Deep tendon reflexes are symmetric.   Assessment/Plan:  One. Intractable seizures  2. Chronic daily headache  3. Medical noncompliance  The patient does not appear to be engaged in her own medical care. The blood work, EEG evaluation, and video EEG monitoring studies that were ordered previously all have not been done. The patient has stopped her Dilantin therapy on her own. At this point, she will be placed on Vimpat, and she will continue Topamax. The EEG study will be reordered. We will try to get medical records from [facility]. She will followup in 4 months.  Jill Alexanders MD 04/14/2014 7:17  PM  [facility] [address] [place], [place] [address]  Phone [phone] Fax [phone]

## 2014-04-14 NOTE — Patient Instructions (Signed)
Epilepsy Epilepsy is a disorder in which a person has repeated seizures over time. A seizure is a release of abnormal electrical activity in the brain. Seizures can cause a change in attention, behavior, or the ability to remain awake and alert (altered mental status). Seizures often involve uncontrollable shaking (convulsions).  Most people with epilepsy lead normal lives. However, people with epilepsy are at an increased risk of falls, accidents, and injuries. Therefore, it is important to begin treatment right away. CAUSES  Epilepsy has many possible causes. Anything that disturbs the normal pattern of brain cell activity can lead to seizures. This may include:   Head injury.  Birth trauma.  High fever as a child.  Stroke.  Bleeding into or around the brain.  Certain drugs.  Prolonged low oxygen, such as what occurs after CPR efforts.  Abnormal brain development.  Certain illnesses, such as meningitis, encephalitis (brain infection), malaria, and other infections.  An imbalance of nerve signaling chemicals (neurotransmitters).  SIGNS AND SYMPTOMS  The symptoms of a seizure can vary greatly from one person to another. Right before a seizure, you may have a warning (aura) that a seizure is about to occur. An aura may include the following symptoms:  Fear or anxiety.  Nausea.  Feeling like the room is spinning (vertigo).  Vision changes, such as seeing flashing lights or spots. Common symptoms during a seizure include:  Abnormal sensations, such as an abnormal smell or a bitter taste in the mouth.   Sudden, general body stiffness.   Convulsions that involve rhythmic jerking of the face, arm, or leg on one or both sides.   Sudden change in consciousness.   Appearing to be awake but not responding.   Appearing to be asleep but cannot be awakened.   Grimacing, chewing, lip smacking, drooling, tongue biting, or loss of bowel or bladder control. After a seizure,  you may feel sleepy for a while. DIAGNOSIS  Your health care provider will ask about your symptoms and take a medical history. Descriptions from any witnesses to your seizures will be very helpful in the diagnosis. A physical exam, including a detailed neurological exam, is necessary. Various tests may be done, such as:   An electroencephalogram (EEG). This is a painless test of your brain waves. In this test, a diagram is created of your brain waves. These diagrams can be interpreted by a specialist.  An MRI of the brain.   A CT scan of the brain.   A spinal tap (lumbar puncture, LP).  Blood tests to check for signs of infection or abnormal blood chemistry. TREATMENT  There is no cure for epilepsy, but it is generally treatable. Once epilepsy is diagnosed, it is important to begin treatment as soon as possible. For most people with epilepsy, seizures can be controlled with medicines. The following may also be used:  A pacemaker for the brain (vagus nerve stimulator) can be used for people with seizures that are not well controlled by medicine.  Surgery on the brain. For some people, epilepsy eventually goes away. HOME CARE INSTRUCTIONS   Follow your health care provider's recommendations on driving and safety in normal activities.  Get enough rest. Lack of sleep can cause seizures.  Only take over-the-counter or prescription medicines as directed by your health care provider. Take any prescribed medicine exactly as directed.  Avoid any known triggers of your seizures.  Keep a seizure diary. Record what you recall about any seizure, especially any possible trigger.   Make   sure the people you live and work with know that you are prone to seizures. They should receive instructions on how to help you. In general, a witness to a seizure should:   Cushion your head and body.   Turn you on your side.   Avoid unnecessarily restraining you.   Not place anything inside your  mouth.   Call for emergency medical help if there is any question about what has occurred.   Follow up with your health care provider as directed. You may need regular blood tests to monitor the levels of your medicine.  SEEK MEDICAL CARE IF:   You develop signs of infection or other illness. This might increase the risk of a seizure.   You seem to be having more frequent seizures.   Your seizure pattern is changing.  SEEK IMMEDIATE MEDICAL CARE IF:   You have a seizure that does not stop after a few moments.   You have a seizure that causes any difficulty in breathing.   You have a seizure that results in a very severe headache.   You have a seizure that leaves you with the inability to speak or use a part of your body.  Document Released: 10/30/2005 Document Revised: 08/20/2013 Document Reviewed: 06/11/2013 [employer] Patient Information 2014 [employer].  

## 2014-04-20 ENCOUNTER — Telehealth: Payer: Self-pay | Admitting: *Deleted

## 2014-04-20 NOTE — Telephone Encounter (Signed)
I returned her call.  She stated she's having a sharp pain that is going through the bottom of her foot, where she had surgery.  I have itching and burning where the keloid is and it's waking me up at night.  I informed her she needs to schedule a followup appointment with Dr. Milinda Pointer.  I transferred her to a scheduler to make an appointment.

## 2014-04-21 ENCOUNTER — Encounter: Payer: Self-pay | Admitting: Podiatry

## 2014-04-21 ENCOUNTER — Ambulatory Visit (INDEPENDENT_AMBULATORY_CARE_PROVIDER_SITE_OTHER): Payer: Medicare Other

## 2014-04-21 ENCOUNTER — Ambulatory Visit (INDEPENDENT_AMBULATORY_CARE_PROVIDER_SITE_OTHER): Payer: Medicare Other | Admitting: Podiatry

## 2014-04-21 VITALS — BP 112/86 | HR 86 | Resp 12

## 2014-04-21 DIAGNOSIS — Z9889 Other specified postprocedural states: Secondary | ICD-10-CM

## 2014-04-21 DIAGNOSIS — M2010 Hallux valgus (acquired), unspecified foot: Secondary | ICD-10-CM | POA: Diagnosis not present

## 2014-04-21 MED ORDER — OXYCODONE-ACETAMINOPHEN 10-325 MG PO TABS: 1.0000 | ORAL_TABLET | ORAL | Status: DC | PRN

## 2014-04-21 NOTE — Progress Notes (Signed)
She presents today status post bunion repair an Akin osteotomy. She states that she has had severe pain with extreme swelling of the hallux right. States the toe has developed dorsally dislocating spasms that she has to push her toe down and she's also started to develop radiating pain along the dorsal aspect of the foot up the leg. She also states that she has been wearing flip-flops regularly.  Objective: Vital signs are stable she is alert and oriented x3. She has a mild hallux malleus with good range of motion of the first metatarsophalangeal joint of the right foot. Radiographic evaluation demonstrates complete osseus healing. There is no edema that presents today erythema cellulitis drainage or odor.   Assessment: Well-healing surgical foot right. Hypertrophic scar. Chronic pain and postop swelling.  Plan: Dispensed a prescription for pain medication suggested that she reduced her time in flip flops and sandals. Should her foot start her she is to get back in her Cam Walker or Darco shoe. I will followup with her in 2-3 months.

## 2014-04-24 ENCOUNTER — Ambulatory Visit (INDEPENDENT_AMBULATORY_CARE_PROVIDER_SITE_OTHER): Payer: Medicare Other | Admitting: Family

## 2014-04-24 ENCOUNTER — Encounter: Payer: Self-pay | Admitting: Family

## 2014-04-24 ENCOUNTER — Ambulatory Visit: Payer: Medicare Other | Admitting: Family

## 2014-04-24 VITALS — BP 120/90 | HR 108 | Temp 98.4°F | Wt 162.0 lb

## 2014-04-24 DIAGNOSIS — E7800 Pure hypercholesterolemia, unspecified: Secondary | ICD-10-CM | POA: Diagnosis not present

## 2014-04-24 DIAGNOSIS — J069 Acute upper respiratory infection, unspecified: Secondary | ICD-10-CM | POA: Diagnosis not present

## 2014-04-24 MED ORDER — METHYLPREDNISOLONE 4 MG PO KIT: PACK | ORAL | Status: AC

## 2014-04-24 NOTE — Progress Notes (Signed)
Subjective:    Patient ID: Molly [name], female    DOB: [birth year], 38 y.o.   MRN: [id number]  HPI 38 year old African American female, nonsmoker than today with complaints of sore throat, sinus drainage, headache x2 days. She's been taking Flonase and uses Zyrtec without much relief.  Patient is currently on simvastatin 20 mg and reports that she feels it may be causing some itching. Otherwise tolerating the medication well. Has a history of hypercholesterolemia.   Review of Systems  Constitutional: Negative.  Negative for fever.  HENT: Positive for congestion, postnasal drip, sneezing and sore throat.   Respiratory: Positive for cough. Negative for shortness of breath and wheezing.   Cardiovascular: Negative.   Gastrointestinal: Negative.   Endocrine: Negative.   Genitourinary: Negative.   Musculoskeletal: Negative.   Skin: Negative.   Allergic/Immunologic: Positive for environmental allergies.  Neurological: Negative.   Psychiatric/Behavioral: Negative.    Past Medical History  Diagnosis Date  . Migraine   . GERD (gastroesophageal reflux disease)   . Lumbago   . Seizures     last seizure nov 2014  . Allergic rhinitis   . Fibromyalgia   . Carpal tunnel syndrome on both sides     History   Social History  . Marital Status: Single    Spouse Name: N/A    Number of Children: N/A  . Years of Education: N/A   Occupational History  . unemployed    Social History Main Topics  . Smoking status: Never Smoker   . Smokeless tobacco: Never Used  . Alcohol Use: No  . Drug Use: No  . Sexual Activity: Not on file   Other Topics Concern  . Not on file   Social History Narrative  . No narrative on file    Past Surgical History  Procedure Laterality Date  . Abdominal hysterectomy    . Tubal ligation    . Foot surgery      bilateral, toe surgery  . Bunionectomy Right 11-07-2013  . Aiken osteotomy Right 11-07-2013    Family History  Problem Relation Age of  Onset  . Seizures Mother   . Emphysema Father   . Seizures Maternal Grandmother   . Seizures Sister   . Bipolar disorder Sister     Allergies  Allergen Reactions  . Phenergan [Promethazine Hcl] Swelling  . Toradol [Ketorolac Tromethamine] Hives    Current Outpatient Prescriptions on File Prior to Visit  Medication Sig Dispense Refill  . ammonium lactate (AMLACTIN) 12 % lotion Apply 1 application topically as needed for dry skin.  400 g  5  . aspirin 325 MG tablet Take 325 mg by mouth daily.      . cetirizine (ZYRTEC) 10 MG tablet TAKE 1 TABLET EVERY DAY  90 tablet  2  . cholecalciferol (VITAMIN D) 1000 UNITS tablet Take 1,000 Units by mouth daily.      . cyclobenzaprine (FLEXERIL) 10 MG tablet TAKE 1 TABLET BY MOUTH 3 TIMES A DAY AS NEEDED FOR SPASMS  30 tablet  0  . dicyclomine (BENTYL) 10 MG capsule Take 10 mg by mouth daily.      Marland Kitchen econazole nitrate 1 % cream Apply topically daily.  85 g  5  . escitalopram (LEXAPRO) 10 MG tablet TAKE 1 TABLET EVERY DAY  30 tablet  3  . fluticasone (FLONASE) 50 MCG/ACT nasal spray USE 2 SPRAYS IN EACH NOSTRIL ONCE DAILY  16 g  0  . HYDROcodone-acetaminophen (NORCO/VICODIN) 5-325 MG per tablet Take 2  tablets by mouth every 4 (four) hours as needed for pain.  6 tablet  0  . ibuprofen (ADVIL,MOTRIN) 800 MG tablet Take 800 mg by mouth every 8 (eight) hours as needed.      . lacosamide (VIMPAT) 50 MG TABS tablet One tablet twice a day for 2 weeks, then take 2 tablets twice a day  120 tablet  1  . Linaclotide (LINZESS) 145 MCG CAPS capsule Take 1 capsule (145 mcg total) by mouth daily.  30 capsule  0  . Magnesium Hydroxide (MILK OF MAGNESIA PO) Take 1 Package by mouth as needed.      . meloxicam (MOBIC) 15 MG tablet Take 1 tablet (15 mg total) by mouth daily.  30 tablet  3  . metoCLOPramide (REGLAN) 10 MG tablet TAKE 1 TABLET BY MOUTH EVERY 6 HOURS AS NEEDED FOR NAUSEA OR HEADACHE  6 tablet  0  . mirtazapine (REMERON) 30 MG tablet TAKE 1 TABLET BY MOUTH  AT BEDTIME  30 tablet  1  . Multiple Vitamin (MULTIVITAMIN) capsule Take 1 capsule by mouth daily.      . naproxen (NAPRELAN) 500 MG 24 hr tablet Take 750 mg by mouth daily with breakfast.      . NEXIUM 20 MG capsule TAKE ONE CAPSULE BY MOUTH EVERY DAY  90 capsule  1  . ondansetron (ZOFRAN) 8 MG tablet TAKE 1 TABLET BY MOUTH 3 TIMES DAILY AS NEEDED FOR NAUSEA  60 tablet  0  . oxyCODONE-acetaminophen (PERCOCET) 10-325 MG per tablet Take 1 tablet by mouth as needed.  80 tablet  0  . PATANOL 0.1 % ophthalmic solution PLACE 1 DROP INTO BOTH EYES 2 (TWO) TIMES DAILY.  5 mL  0  . polyethylene glycol (MIRALAX / GLYCOLAX) packet MIX CONTENTS OF 1 PACKET WITH 4 TO 8 OUNCES OF LIQUID AND DRINK EVERY DAY  30 packet  0  . rizatriptan (MAXALT-MLT) 10 MG disintegrating tablet Take 1 tablet (10 mg total) by mouth 3 (three) times daily as needed for migraine. May repeat in 2 hours if needed  12 tablet  5  . simvastatin (ZOCOR) 20 MG tablet Take 1 tablet (20 mg total) by mouth at bedtime.  30 tablet  1  . topiramate (TOPAMAX) 100 MG tablet Take 1 tablet (100 mg total) by mouth 2 (two) times daily.  180 tablet  1  . zolpidem (AMBIEN) 5 MG tablet TAKE 1 TABLET BY MOUTH AT BEDTIME AS NEEDED FOR SLEEP  30 tablet  1   No current facility-administered medications on file prior to visit.    BP 120/90  Pulse 108  Temp(Src) 98.4 F (36.9 C) (Oral)  Wt 162 lb (73.483 kg)chart     Objective:   Physical Exam  Constitutional: She is oriented to person, place, and time. She appears well-developed and well-nourished.  HENT:  Right Ear: External ear normal.  Left Ear: External ear normal.  Nose: Nose normal.  Mouth/Throat: Oropharynx is clear and moist.  Neck: Normal range of motion. Neck supple.  Cardiovascular: Normal rate, regular rhythm and normal heart sounds.   Pulmonary/Chest: Effort normal and breath sounds normal.  Abdominal: Soft. Bowel sounds are normal.  Musculoskeletal: Normal range of motion.    Neurological: She is alert and oriented to person, place, and time.  Skin: Skin is warm and dry.  Psychiatric: She has a normal mood and affect.          Assessment & Plan:   Problem List Items Addressed This Visit  None    Visit Diagnoses   Acute upper respiratory infections of unspecified site    -  Primary    Pure hypercholesterolemia          Decrease simvastatin to 20 mg every other day. Call the office if symptoms worsen. Recheck as scheduled, and as needed.

## 2014-04-24 NOTE — Patient Instructions (Signed)

## 2014-04-24 NOTE — Progress Notes (Signed)
Pre visit review using our clinic review tool, if applicable. No additional management support is needed unless otherwise documented below in the visit note. 

## 2014-04-27 ENCOUNTER — Telehealth: Payer: Self-pay | Admitting: Family

## 2014-04-27 MED ORDER — AZITHROMYCIN 250 MG PO TABS
250.0000 mg | ORAL_TABLET | Freq: Every day | ORAL | Status: DC
Start: 2014-04-27 — End: 2014-06-17

## 2014-04-27 NOTE — Telephone Encounter (Signed)
She does not need an antibiotic. She will get better regardless bc this is a viral illness. I will send in med but don't suggest taking it.

## 2014-04-27 NOTE — Telephone Encounter (Signed)
Pt states that she did pick up the medrol dosepak but it is not working. She says her tonsils are "sticking to the back of my throat", HA is getting worse, throat feels worse and fever is getting higher, although she has not checked it but she woke up this morning and her clothes were drenched. She also says that she was vomiting all night. Please advise

## 2014-04-27 NOTE — Telephone Encounter (Signed)
Patient Information:  Caller Name: Everest  Phone: [phone]  Patient: Molly [name], Molly [name]  Gender: Female  DOB: 07/02/1976  Age: 38 Years  PCP: Roxy Cedar [facility])  Pregnant: No  Office Follow Up:  Does the office need to follow up with this patient?: Yes  Instructions For The Office: Please f/u with pt concerning Rx for URI, thank you.   Symptoms  Reason For Call & Symptoms: Pt reports she was seen in the office on Friday 04/24/14 and diagnosed with Upper Resp. Infection but not given Rx.  Pt states she is not improving and getting worse with fever at night (not taken but knows she has fever).  Pt requesting antibiotic called to pharmacy.  Reviewed Health History In EMR: Yes  Reviewed Medications In EMR: Yes  Reviewed Allergies In EMR: Yes  Reviewed Surgeries / Procedures: Yes  Date of Onset of Symptoms: 04/24/2014 OB / GYN:  LMP: Unknown  Guideline(s) Used:  Sore Throat  Disposition Per Guideline:   See Today in Office  Reason For Disposition Reached:   Severe sore throat pain  Advice Given:  Call Back If:  You become worse.  Patient Refused Recommendation:  Patient Requests Prescription  Pt requesting Rx called to her pharmacy for her sore throat, earache and fever.

## 2014-04-27 NOTE — Telephone Encounter (Signed)
Pt aware.

## 2014-04-29 ENCOUNTER — Encounter: Payer: Self-pay | Admitting: *Deleted

## 2014-05-10 ENCOUNTER — Other Ambulatory Visit: Payer: Self-pay | Admitting: Family

## 2014-05-18 ENCOUNTER — Other Ambulatory Visit: Payer: Self-pay | Admitting: Family

## 2014-05-19 ENCOUNTER — Ambulatory Visit (INDEPENDENT_AMBULATORY_CARE_PROVIDER_SITE_OTHER): Payer: Medicare Other | Admitting: Podiatry

## 2014-05-19 ENCOUNTER — Encounter: Payer: Self-pay | Admitting: Podiatry

## 2014-05-19 VITALS — BP 89/45 | HR 51 | Resp 17

## 2014-05-19 DIAGNOSIS — Z9889 Other specified postprocedural states: Secondary | ICD-10-CM

## 2014-05-19 DIAGNOSIS — R52 Pain, unspecified: Secondary | ICD-10-CM

## 2014-05-19 DIAGNOSIS — L905 Scar conditions and fibrosis of skin: Secondary | ICD-10-CM

## 2014-05-19 NOTE — Progress Notes (Signed)
   Subjective:    Patient ID: Molly [name], female    DOB: 15-Aug-1976, 38 y.o.   MRN: [id number]  HPI  Pt states that her right foot is getting worse, she has constant pain in her foot concentrated near site of surgery and shoots up leg. States that she noticing her great toe on righ tfoot shifting to the right. She has had this pain for 1 month and has used ibuprofen, soaks and ice but hasnt gotten any relief. Noted mild swelling of right foot  Review of Systems     Objective:   Physical Exam: Pulses are strongly palpable right foot. There is no erythema edema saline is drainage or odor to the right foot. She has a very hypertrophic scar that is painful on palpation and range of motion. This is limiting her range of motion at the IP joint of the hallux.        Assessment & Plan:  Assessment: Is a painful scar dorsal aspect right foot with capsulitis.  Plan: Injection with Kenalog and local anesthetic to the area capsulitis and scar. I will followup with her in 6 weeks to reevaluate.

## 2014-06-01 ENCOUNTER — Telehealth: Payer: Self-pay | Admitting: Family

## 2014-06-01 MED ORDER — VITAMIN D 1000 UNITS PO TABS: 1000.0000 [IU] | ORAL_TABLET | Freq: Every day | ORAL | Status: DC

## 2014-06-01 NOTE — Telephone Encounter (Signed)
Pt is requesting a new rx for  Cholecalciferol (vitamin D) 1000 units, pt states that her vitamin d level is low, because she is having anxiety attacks. Pt states that she can't come in for labs because she is being charged $266, and she has to contact medicare/medicaid to find out what is going on. [email or web address]

## 2014-06-01 NOTE — Telephone Encounter (Signed)
Done

## 2014-06-02 ENCOUNTER — Ambulatory Visit: Payer: Medicare Other | Admitting: Podiatry

## 2014-06-16 ENCOUNTER — Emergency Department (HOSPITAL_BASED_OUTPATIENT_CLINIC_OR_DEPARTMENT_OTHER): Payer: Medicare Other

## 2014-06-16 ENCOUNTER — Encounter (HOSPITAL_BASED_OUTPATIENT_CLINIC_OR_DEPARTMENT_OTHER): Payer: Self-pay | Admitting: Emergency Medicine

## 2014-06-16 ENCOUNTER — Emergency Department (HOSPITAL_BASED_OUTPATIENT_CLINIC_OR_DEPARTMENT_OTHER)
Admission: EM | Admit: 2014-06-16 | Discharge: 2014-06-16 | Disposition: A | Discharge status: Home / Self Care | Payer: Medicare Other | Attending: Emergency Medicine | Admitting: Emergency Medicine

## 2014-06-16 DIAGNOSIS — R0989 Other specified symptoms and signs involving the circulatory and respiratory systems: Secondary | ICD-10-CM | POA: Diagnosis not present

## 2014-06-16 DIAGNOSIS — E7800 Pure hypercholesterolemia, unspecified: Secondary | ICD-10-CM | POA: Insufficient documentation

## 2014-06-16 DIAGNOSIS — H539 Unspecified visual disturbance: Secondary | ICD-10-CM | POA: Insufficient documentation

## 2014-06-16 DIAGNOSIS — R110 Nausea: Secondary | ICD-10-CM | POA: Insufficient documentation

## 2014-06-16 DIAGNOSIS — IMO0002 Reserved for concepts with insufficient information to code with codable children: Secondary | ICD-10-CM | POA: Diagnosis not present

## 2014-06-16 DIAGNOSIS — R0789 Other chest pain: Secondary | ICD-10-CM | POA: Diagnosis not present

## 2014-06-16 DIAGNOSIS — Z7982 Long term (current) use of aspirin: Secondary | ICD-10-CM | POA: Diagnosis not present

## 2014-06-16 DIAGNOSIS — M79609 Pain in unspecified limb: Secondary | ICD-10-CM | POA: Insufficient documentation

## 2014-06-16 DIAGNOSIS — Z792 Long term (current) use of antibiotics: Secondary | ICD-10-CM | POA: Insufficient documentation

## 2014-06-16 DIAGNOSIS — Z8709 Personal history of other diseases of the respiratory system: Secondary | ICD-10-CM | POA: Insufficient documentation

## 2014-06-16 DIAGNOSIS — R079 Chest pain, unspecified: Secondary | ICD-10-CM | POA: Diagnosis not present

## 2014-06-16 DIAGNOSIS — Z791 Long term (current) use of non-steroidal anti-inflammatories (NSAID): Secondary | ICD-10-CM | POA: Diagnosis not present

## 2014-06-16 DIAGNOSIS — R209 Unspecified disturbances of skin sensation: Secondary | ICD-10-CM | POA: Diagnosis not present

## 2014-06-16 DIAGNOSIS — M7918 Myalgia, other site: Secondary | ICD-10-CM

## 2014-06-16 DIAGNOSIS — G43909 Migraine, unspecified, not intractable, without status migrainosus: Secondary | ICD-10-CM | POA: Diagnosis not present

## 2014-06-16 DIAGNOSIS — K219 Gastro-esophageal reflux disease without esophagitis: Secondary | ICD-10-CM | POA: Diagnosis not present

## 2014-06-16 DIAGNOSIS — M25529 Pain in unspecified elbow: Secondary | ICD-10-CM | POA: Diagnosis not present

## 2014-06-16 DIAGNOSIS — G40909 Epilepsy, unspecified, not intractable, without status epilepticus: Secondary | ICD-10-CM | POA: Insufficient documentation

## 2014-06-16 HISTORY — DX: Pure hypercholesterolemia, unspecified: E78.00

## 2014-06-16 LAB — CBC WITH DIFFERENTIAL/PLATELET
Basophils Absolute: 0 10*3/uL (ref 0.0–0.1)
Basophils Relative: 0 % (ref 0–1)
EOS ABS: 0.1 10*3/uL (ref 0.0–0.7)
EOS PCT: 1 % (ref 0–5)
HCT: 33.4 % — ABNORMAL LOW (ref 36.0–46.0)
Hemoglobin: 11.2 g/dL — ABNORMAL LOW (ref 12.0–15.0)
LYMPHS ABS: 2.6 10*3/uL (ref 0.7–4.0)
Lymphocytes Relative: 42 % (ref 12–46)
MCH: 31.4 pg (ref 26.0–34.0)
MCHC: 33.5 g/dL (ref 30.0–36.0)
MCV: 93.6 fL (ref 78.0–100.0)
Monocytes Absolute: 0.4 10*3/uL (ref 0.1–1.0)
Monocytes Relative: 6 % (ref 3–12)
Neutro Abs: 3.1 10*3/uL (ref 1.7–7.7)
Neutrophils Relative %: 51 % (ref 43–77)
Platelets: 208 10*3/uL (ref 150–400)
RBC: 3.57 MIL/uL — AB (ref 3.87–5.11)
RDW: 11.8 % (ref 11.5–15.5)
WBC: 6.2 10*3/uL (ref 4.0–10.5)

## 2014-06-16 LAB — BASIC METABOLIC PANEL
Anion gap: 13 (ref 5–15)
BUN: 13 mg/dL (ref 6–23)
CALCIUM: 10.1 mg/dL (ref 8.4–10.5)
CO2: 27 mEq/L (ref 19–32)
Chloride: 102 mEq/L (ref 96–112)
Creatinine, Ser: 0.9 mg/dL (ref 0.50–1.10)
GFR calc Af Amer: >90 mL/min (ref >90)
GFR, EST NON AFRICAN AMERICAN: 82 mL/min — AB (ref >90)
GLUCOSE: 93 mg/dL (ref 70–99)
Potassium: 3.9 mEq/L (ref 3.7–5.3)
Sodium: 142 mEq/L (ref 137–147)

## 2014-06-16 LAB — TROPONIN I: Troponin I: <0.3 ng/mL (ref <0.30)

## 2014-06-16 MED ORDER — HYDROCODONE-ACETAMINOPHEN 5-325 MG PO TABS: 1.0000 | ORAL_TABLET | ORAL | Status: DC | PRN

## 2014-06-16 NOTE — ED Provider Notes (Signed)
Medical screening examination/treatment/procedure(s) were performed by non-physician practitioner and as supervising physician I was immediately available for consultation/collaboration.   EKG Interpretation None       Threasa Beards, MD 06/16/14 2250

## 2014-06-16 NOTE — Discharge Instructions (Signed)

## 2014-06-16 NOTE — ED Notes (Signed)
Pt reports she started having pain, swelling in her right arm about 1 hour, then started having chest pressure, nausea, headache.

## 2014-06-16 NOTE — ED Provider Notes (Signed)
Date: 06/16/2014  Rate: 79  Rhythm: normal sinus rhythm  QRS Axis: normal  Intervals: normal  ST/T Wave abnormalities: normal  Conduction Disutrbances: none  Narrative Interpretation: unremarkable ekg not available in epic for interpretation in [place], MD 06/16/14 2107

## 2014-06-16 NOTE — ED Provider Notes (Signed)
CSN: [id number]     Arrival date & time 06/16/14  1843 History   First MD Initiated Contact with Patient 06/16/14 2005     Chief Complaint  Patient presents with  . Arm Pain     (Consider location/radiation/quality/duration/timing/severity/associated sxs/prior Treatment) Patient is a 38 y.o. female presenting with arm pain. The history is provided by the patient. No language interpreter was used.  Arm Pain Associated symptoms include chest pain, headaches, nausea and numbness. Pertinent negatives include no fever, rash or vomiting. Associated symptoms comments: Molly [name] is a 38 year old female with a hx of fibromyalgia and migraines who presents today with a headache and left upper arm pain which radiates down the arm to her fingers and up the arm to her left chest. She describes the pain as a throbbing pain which shoots up and down her arm. Reports she had a "knot" on her lateral wrist earlier but is gone now. States she has numbness to all 5 left fingers starting today. Her associated chest pain is described as a lot of pressure, worse on deep inspiration. Denies any trauma or weight-bearing activities. States her headache is concentrated in the temporal areas and across the forehead with some blurred vision. Reports this is a different pattern from her typical migraines. Admits to associated nausea with no vomiting. She also mentions a leg pain in her left groin (similar shooting pain as her arm) which travels down her left leg. Denies trauma, numbness, tingling.    . She has tried acetaminophen for the symptoms. The treatment provided mild relief.    Past Medical History  Diagnosis Date  . Migraine   . GERD (gastroesophageal reflux disease)   . Lumbago   . Seizures     last seizure nov 2014  . Allergic rhinitis   . Fibromyalgia   . Carpal tunnel syndrome on both sides   . High cholesterol    Past Surgical History  Procedure Laterality Date  . Abdominal hysterectomy    . Tubal  ligation    . Foot surgery      bilateral, toe surgery  . Bunionectomy Right 11-07-2013  . Aiken osteotomy Right 11-07-2013   Family History  Problem Relation Age of Onset  . Seizures Mother   . Emphysema Father   . Seizures Maternal Grandmother   . Seizures Sister   . Bipolar disorder Sister    History  Substance Use Topics  . Smoking status: Never Smoker   . Smokeless tobacco: Never Used  . Alcohol Use: No   OB History   Grav Para Term Preterm Abortions TAB SAB Ect Mult Living                 Review of Systems  Constitutional: Negative for fever.  Eyes: Positive for visual disturbance.  Respiratory: Negative for shortness of breath.   Cardiovascular: Positive for chest pain.  Gastrointestinal: Positive for nausea. Negative for vomiting.  Musculoskeletal:       Left upper arm pain radiating up to chest and down to fingers. Numbness to distal tips of 5 left fingers.  Skin: Negative for rash.  Neurological: Positive for numbness and headaches.  All other systems reviewed and are negative.     Allergies  Phenergan and Toradol  Home Medications   Prior to Admission medications   Medication Sig Start Date End Date Taking? Authorizing Provider  ammonium lactate (AMLACTIN) 12 % lotion Apply 1 application topically as needed for dry skin. 11/27/13   Max  T Hyatt, DPM  aspirin 325 MG tablet Take 325 mg by mouth daily.    Historical Provider, MD  azithromycin (ZITHROMAX Z-PAK) 250 MG tablet Take 1 tablet (250 mg total) by mouth daily. 04/27/14   Timoteo Gaul, FNP  cetirizine (ZYRTEC) 10 MG tablet TAKE 1 TABLET EVERY DAY 04/11/14   Timoteo Gaul, FNP  cholecalciferol (VITAMIN D) 1000 UNITS tablet Take 1 tablet (1,000 Units total) by mouth daily. 06/01/14   Timoteo Gaul, FNP  cyclobenzaprine (FLEXERIL) 10 MG tablet TAKE 1 TABLET BY MOUTH 3 TIMES A DAY AS NEEDED FOR SPASMS    Timoteo Gaul, FNP  dicyclomine (BENTYL) 10 MG capsule Take 10 mg by mouth daily.  02/09/14   Historical Provider, MD  econazole nitrate 1 % cream Apply topically daily. 11/27/13   Max T Hyatt, DPM  escitalopram (LEXAPRO) 10 MG tablet TAKE 1 TABLET EVERY DAY 03/16/14   Timoteo Gaul, FNP  fluticasone (FLONASE) 50 MCG/ACT nasal spray USE 2 SPRAYS IN EACH NOSTRIL ONCE DAILY    Timoteo Gaul, FNP  HYDROcodone-acetaminophen (NORCO/VICODIN) 5-325 MG per tablet Take 2 tablets by mouth every 4 (four) hours as needed for pain. 04/30/13   Orpah Greek, MD  ibuprofen (ADVIL,MOTRIN) 800 MG tablet Take 800 mg by mouth every 8 (eight) hours as needed.    Historical Provider, MD  lacosamide (VIMPAT) 50 MG TABS tablet One tablet twice a day for 2 weeks, then take 2 tablets twice a day 04/14/14   Kathrynn Ducking, MD  Linaclotide [facility]) 145 MCG CAPS capsule Take 1 capsule (145 mcg total) by mouth daily. 03/04/14   Milus Banister, MD  Magnesium Hydroxide (MILK OF MAGNESIA PO) Take 1 Package by mouth as needed.    Historical Provider, MD  meloxicam (MOBIC) 15 MG tablet Take 1 tablet (15 mg total) by mouth daily. 01/20/14   Max T Hyatt, DPM  metoCLOPramide (REGLAN) 10 MG tablet TAKE 1 TABLET BY MOUTH EVERY 6 HOURS AS NEEDED FOR NAUSEA OR HEADACHE    Timoteo Gaul, FNP  mirtazapine (REMERON) 30 MG tablet TAKE 1 TABLET BY MOUTH AT BEDTIME    Timoteo Gaul, FNP  Multiple Vitamin (MULTIVITAMIN) capsule Take 1 capsule by mouth daily.    Historical Provider, MD  naproxen (NAPRELAN) 500 MG 24 hr tablet Take 750 mg by mouth daily with breakfast.    Historical Provider, MD  NEXIUM 20 MG capsule TAKE ONE CAPSULE BY MOUTH EVERY DAY 04/11/14   Timoteo Gaul, FNP  ondansetron (ZOFRAN) 8 MG tablet TAKE 1 TABLET BY MOUTH 3 TIMES DAILY AS NEEDED FOR NAUSEA    Timoteo Gaul, FNP  oxyCODONE-acetaminophen (PERCOCET) 10-325 MG per tablet Take 1 tablet by mouth as needed. 04/21/14   Max T Hyatt, DPM  PATANOL 0.1 % ophthalmic solution PLACE 1 DROP INTO BOTH EYES 2 (TWO) TIMES DAILY.  12/05/13   Timoteo Gaul, FNP  PHENYTEK 300 MG ER capsule  02/09/14   Historical Provider, MD  polyethylene glycol (MIRALAX / GLYCOLAX) packet MIX CONTENTS OF 1 PACKET WITH 4 TO 8 OUNCES OF LIQUID AND DRINK EVERY DAY    Timoteo Gaul, FNP  rizatriptan (MAXALT-MLT) 10 MG disintegrating tablet Take 1 tablet (10 mg total) by mouth 3 (three) times daily as needed for migraine. May repeat in 2 hours if needed 10/08/13   Kathrynn Ducking, MD  simvastatin (ZOCOR) 20 MG tablet TAKE 1 TABLET (20 MG TOTAL) BY MOUTH AT BEDTIME.  Timoteo Gaul, FNP  topiramate (TOPAMAX) 100 MG tablet Take 1 tablet (100 mg total) by mouth 2 (two) times daily. 04/14/14   Kathrynn Ducking, MD  zolpidem (AMBIEN) 5 MG tablet TAKE 1 TABLET BY MOUTH AT BEDTIME AS NEEDED FOR SLEEP    Timoteo Gaul, FNP   BP 142/95  Pulse 75  Temp(Src) 98.1 F (36.7 C) (Oral)  Resp 18  Ht 5\' 9"  (1.753 m)  Wt 153 lb (69.4 kg)  BMI 22.58 kg/m2  SpO2 100% Physical Exam  Constitutional: She is oriented to person, place, and time. She appears well-developed and well-nourished.  HENT:  Head: Normocephalic.  Eyes: Pupils are equal, round, and reactive to light.  Neck: Normal range of motion. Neck supple.  Cardiovascular: Normal rate, regular rhythm and intact distal pulses.   No murmur heard. Chest pain reproducible with palpation.  Pulmonary/Chest: Effort normal and breath sounds normal. She has no wheezes. She has no rales.  Abdominal: Soft. Bowel sounds are normal. There is no tenderness. There is no rebound and no guarding.  Musculoskeletal: Normal range of motion. She exhibits no edema.       Left upper arm: She exhibits tenderness. She exhibits no swelling and no deformity.  Left upper arm tenderness radiating to chest and down left arm. Full ROM. Radial pulses intact. 5/5 strength of left arm. Mild tenderness to palpation. No shoulder, elbow or wrist deformities or swelling.   Neurological: She is alert and oriented to  person, place, and time. She exhibits normal muscle tone.  No deficits of coordination, speech clear and focused, CN's 3-12 grossly intact. No lateralizing weakness.   Skin: Skin is warm and dry. No rash noted.  Psychiatric: She has a normal mood and affect.    ED Course  Procedures (including critical care time) Labs Review Labs Reviewed - No data to display  Imaging Review No results found.   EKG Interpretation None      MDM   Final diagnoses:  None    1. Musculoskeletal pain  Upper extremity tenderness may be related to radiculopathy given neck tenderness that reproduces arm pain, however, path of pain travel does not follow specific dermatome. No neurologic deficits on exam. Does not appear to be cardiac. She appears stable for discharge with normal VS, reassuring labs and PCP follow up in the outpatient setting.    Dewaine Oats, PA-C 06/16/14 2234

## 2014-06-17 ENCOUNTER — Emergency Department (HOSPITAL_COMMUNITY)
Admission: EM | Admit: 2014-06-17 | Discharge: 2014-06-17 | Disposition: A | Discharge status: Home / Self Care | Payer: Medicare Other | Attending: Emergency Medicine | Admitting: Emergency Medicine

## 2014-06-17 ENCOUNTER — Encounter (HOSPITAL_COMMUNITY): Payer: Self-pay | Admitting: Emergency Medicine

## 2014-06-17 ENCOUNTER — Telehealth: Payer: Self-pay | Admitting: Family

## 2014-06-17 DIAGNOSIS — IMO0002 Reserved for concepts with insufficient information to code with codable children: Secondary | ICD-10-CM | POA: Insufficient documentation

## 2014-06-17 DIAGNOSIS — G40909 Epilepsy, unspecified, not intractable, without status epilepticus: Secondary | ICD-10-CM | POA: Insufficient documentation

## 2014-06-17 DIAGNOSIS — K219 Gastro-esophageal reflux disease without esophagitis: Secondary | ICD-10-CM | POA: Diagnosis not present

## 2014-06-17 DIAGNOSIS — Z792 Long term (current) use of antibiotics: Secondary | ICD-10-CM | POA: Diagnosis not present

## 2014-06-17 DIAGNOSIS — Z79899 Other long term (current) drug therapy: Secondary | ICD-10-CM | POA: Insufficient documentation

## 2014-06-17 DIAGNOSIS — Z791 Long term (current) use of non-steroidal anti-inflammatories (NSAID): Secondary | ICD-10-CM | POA: Insufficient documentation

## 2014-06-17 DIAGNOSIS — M79602 Pain in left arm: Secondary | ICD-10-CM

## 2014-06-17 DIAGNOSIS — M79609 Pain in unspecified limb: Secondary | ICD-10-CM | POA: Diagnosis not present

## 2014-06-17 DIAGNOSIS — R0789 Other chest pain: Secondary | ICD-10-CM | POA: Diagnosis not present

## 2014-06-17 DIAGNOSIS — G43909 Migraine, unspecified, not intractable, without status migrainosus: Secondary | ICD-10-CM | POA: Insufficient documentation

## 2014-06-17 DIAGNOSIS — Z7982 Long term (current) use of aspirin: Secondary | ICD-10-CM | POA: Insufficient documentation

## 2014-06-17 DIAGNOSIS — E7800 Pure hypercholesterolemia, unspecified: Secondary | ICD-10-CM | POA: Insufficient documentation

## 2014-06-17 DIAGNOSIS — R079 Chest pain, unspecified: Secondary | ICD-10-CM

## 2014-06-17 LAB — CBC
HCT: 36.3 % (ref 36.0–46.0)
Hemoglobin: 12 g/dL (ref 12.0–15.0)
MCH: 31.2 pg (ref 26.0–34.0)
MCHC: 33.1 g/dL (ref 30.0–36.0)
MCV: 94.3 fL (ref 78.0–100.0)
Platelets: 210 10*3/uL (ref 150–400)
RBC: 3.85 MIL/uL — ABNORMAL LOW (ref 3.87–5.11)
RDW: 12.2 % (ref 11.5–15.5)
WBC: 6 10*3/uL (ref 4.0–10.5)

## 2014-06-17 LAB — BASIC METABOLIC PANEL
ANION GAP: 12 (ref 5–15)
BUN: 10 mg/dL (ref 6–23)
CHLORIDE: 103 meq/L (ref 96–112)
CO2: 26 mEq/L (ref 19–32)
CREATININE: 0.83 mg/dL (ref 0.50–1.10)
Calcium: 9.1 mg/dL (ref 8.4–10.5)
Glucose, Bld: 87 mg/dL (ref 70–99)
Potassium: 4 mEq/L (ref 3.7–5.3)
Sodium: 141 mEq/L (ref 137–147)

## 2014-06-17 LAB — I-STAT TROPONIN, ED: Troponin i, poc: 0 ng/mL (ref 0.00–0.08)

## 2014-06-17 LAB — PRO B NATRIURETIC PEPTIDE: Pro B Natriuretic peptide (BNP): 19.4 pg/mL (ref 0–125)

## 2014-06-17 MED ORDER — OXYCODONE-ACETAMINOPHEN 5-325 MG PO TABS
1.0000 | ORAL_TABLET | Freq: Once | ORAL | Status: AC
  Administered 2014-06-17: 1 via ORAL
  Filled 2014-06-17: qty 1

## 2014-06-17 MED ORDER — OXYCODONE-ACETAMINOPHEN 5-325 MG PO TABS: 1.0000 | ORAL_TABLET | Freq: Once | ORAL | Status: DC

## 2014-06-17 NOTE — Discharge Instructions (Signed)
Orders have been placed for an upper extremity ultrasound which may be performed tomorrow morning at 8am or later.  Go to radiology department to have ultrasound done. Follow-up with your primary care physician. Return to the ED for new concerns.

## 2014-06-17 NOTE — Telephone Encounter (Signed)
Patient Information:  Caller Name: Patricia  Phone: [phone]  Patient: Abigale, Dorow  Gender: Female  DOB: 08/10/1976  Age: 38 Years  PCP: Roxy Cedar [facility])  Pregnant: No  Office Follow Up:  Does the office need to follow up with this patient?: No  Instructions For The Office: N/A   Symptoms  Reason For Call & Symptoms: Pt was in the ED 06/16/14 PM  for pain on the left side of her body and radiating into her chest.   They suggested she get a referral  for a musculoskeletal Dr.  The  pain in her left arm is 10/10 on the pain scale.  Dx'd with musculoskeletal pain.  Per nursing judgement sending pt back to the ED for left arm pain and swelling.   She will go to [facility].   Reviewed Health History In EMR: Yes  Reviewed Medications In EMR: Yes  Reviewed Allergies In EMR: Yes  Reviewed Surgeries / Procedures: Yes  Date of Onset of Symptoms: 06/16/2014  Treatments Tried: Hydrocodone but she did not get it filled as she " has stronger stuff at home with me" She ahs only taken Bayer ASA  Treatments Tried Worked: No OB / GYN:  LMP: Unknown  Guideline(s) Used:  Ankle Pain  Arm Pain  Disposition Per Guideline:   Go to ED Now (or to Office with PCP Approval)  Reason For Disposition Reached:   Entire arm is swollen  Advice Given:  N/A  Patient Will Follow Care Advice:  YES

## 2014-06-17 NOTE — ED Notes (Signed)
Pt declines chest Xray, states had Xray performed yesterday and does not want a repeat

## 2014-06-17 NOTE — ED Notes (Signed)
Attempted to draw pts labs was unsuccessful. Called phlebotomy  and spoke with Saa.

## 2014-06-17 NOTE — ED Notes (Signed)
Discussed plan of care with Lattie Haw, PA-C.

## 2014-06-17 NOTE — ED Provider Notes (Signed)
CSN: [id number]     Arrival date & time 06/17/14  1554 History   First MD Initiated Contact with Patient 06/17/14 2048     Chief Complaint  Patient presents with  . Chest Pain  . Arm Pain     (Consider location/radiation/quality/duration/timing/severity/associated sxs/prior Treatment) The history is provided by the patient and medical records.   This is a 38 year old female with past medical history significant for migraine headaches, GERD, seizure disorder, fibromyalgia, hyperlipidemia, presenting ED for chest pain and left arm pain. Patient was seen in the emergency department yesterday for the same complaint with negative workup. She states she attempted to followup with her primary care physician today, but when she told them that she was continuing to have pain, the encourage her to return to the emergency department for repeat evaluation. Patient sx initially started while she was driving her car yesterday and has persisted despite taking multiple forms of pain medication.  Patient states pain continues to be localized to the left side of her chest and left shoulder. There is some radiation down to her hand.  She denies numbness, paresthesias, or weakness of left arm.  Denies any shortness of breath, palpitations, dizziness, weakness. Patient has no prior history of DVT or PE. She denies any recent travel, surgeries, extremity swelling.  No exogenous hormones, patient is status post hysterectomy. No prior cardiac hx.  Family hx--states her cousin died of a massive blood clot.  Pt has never been a smoker.  Denies recent heavy lifting or injuries.  VS stable on arrival.  Past Medical History  Diagnosis Date  . Migraine   . GERD (gastroesophageal reflux disease)   . Lumbago   . Seizures     last seizure nov 2014  . Allergic rhinitis   . Fibromyalgia   . Carpal tunnel syndrome on both sides   . High cholesterol    Past Surgical History  Procedure Laterality Date  . Abdominal  hysterectomy    . Tubal ligation    . Foot surgery      bilateral, toe surgery  . Bunionectomy Right 11-07-2013  . Aiken osteotomy Right 11-07-2013   Family History  Problem Relation Age of Onset  . Seizures Mother   . Emphysema Father   . Seizures Maternal Grandmother   . Seizures Sister   . Bipolar disorder Sister    History  Substance Use Topics  . Smoking status: Never Smoker   . Smokeless tobacco: Never Used  . Alcohol Use: No   OB History   Grav Para Term Preterm Abortions TAB SAB Ect Mult Living                 Review of Systems  Cardiovascular: Positive for chest pain.  Musculoskeletal: Positive for arthralgias and myalgias.  All other systems reviewed and are negative.     Allergies  Fish allergy; Phenergan; and Toradol  Home Medications   Prior to Admission medications   Medication Sig Start Date End Date Taking? Authorizing Provider  ammonium lactate (LAC-HYDRIN) 12 % lotion Apply 1 application topically as needed for dry skin. 11/27/13  Yes Max T Hyatt, DPM  aspirin 325 MG tablet Take 325 mg by mouth daily.   Yes Historical Provider, MD  azithromycin (ZITHROMAX) 250 MG tablet Take 250 mg by mouth daily. Unknown start date 04/27/14  Yes Timoteo Gaul, FNP  cetirizine (ZYRTEC) 10 MG tablet Take 10 mg by mouth daily.   Yes Historical Provider, MD  cholecalciferol (VITAMIN D)  1000 UNITS tablet Take 1,000 Units by mouth daily. 06/01/14  Yes Timoteo Gaul, FNP  cyclobenzaprine (FLEXERIL) 10 MG tablet TAKE 1 TABLET BY MOUTH 3 TIMES A DAY AS NEEDED FOR SPASMS   Yes Timoteo Gaul, FNP  dicyclomine (BENTYL) 10 MG capsule Take 10 mg by mouth daily. 02/09/14  Yes Historical Provider, MD  econazole nitrate 1 % cream Apply topically daily. 11/27/13  Yes Max T Hyatt, DPM  escitalopram (LEXAPRO) 10 MG tablet TAKE 1 TABLET EVERY DAY 03/16/14  Yes Timoteo Gaul, FNP  esomeprazole (NEXIUM) 40 MG capsule Take 40 mg by mouth daily at 12 noon.   Yes Historical  Provider, MD  fluticasone (VERAMYST) 27.5 MCG/SPRAY nasal spray Place 2 sprays into the nose daily.   Yes Historical Provider, MD  HYDROcodone-acetaminophen (NORCO/VICODIN) 5-325 MG per tablet Take 2 tablets by mouth every 4 (four) hours as needed for moderate pain. 04/30/13  Yes Orpah Greek, MD  ibuprofen (ADVIL,MOTRIN) 800 MG tablet Take 800 mg by mouth every 8 (eight) hours as needed for moderate pain.    Yes Historical Provider, MD  lacosamide (VIMPAT) 50 MG TABS tablet Take 50 mg by mouth 2 (two) times daily. 04/14/14  Yes Kathrynn Ducking, MD  Linaclotide [facility]) 145 MCG CAPS capsule Take 145 mcg by mouth daily. 03/04/14  Yes Milus Banister, MD  meloxicam (MOBIC) 15 MG tablet Take 15 mg by mouth daily. 01/20/14  Yes Max T Hyatt, DPM  metoCLOPramide (REGLAN) 10 MG tablet Take 10 mg by mouth every 6 (six) hours as needed for nausea.   Yes Historical Provider, MD  mirtazapine (REMERON) 30 MG tablet Take 30 mg by mouth at bedtime.   Yes Historical Provider, MD  Multiple Vitamin (MULTIVITAMIN) capsule Take 1 capsule by mouth daily.   Yes Historical Provider, MD  naproxen (NAPRELAN) 500 MG 24 hr tablet Take 750 mg by mouth daily with breakfast.   Yes Historical Provider, MD  olopatadine (PATANOL) 0.1 % ophthalmic solution Place 1 drop into both eyes 2 (two) times daily.   Yes Historical Provider, MD  ondansetron (ZOFRAN) 8 MG tablet Take 8 mg by mouth 3 (three) times daily as needed for nausea or vomiting.   Yes Historical Provider, MD  oxyCODONE-acetaminophen (PERCOCET) 10-325 MG per tablet Take 1 tablet by mouth daily as needed for pain. 04/21/14  Yes Max T Hyatt, DPM  PHENYTEK 300 MG ER capsule Take 300 mg by mouth daily.  02/09/14  Yes Historical Provider, MD  polyethylene glycol (MIRALAX / GLYCOLAX) packet MIX CONTENTS OF 1 PACKET WITH 4 TO 8 OUNCES OF LIQUID AND DRINK EVERY DAY   Yes Timoteo Gaul, FNP  rizatriptan (MAXALT-MLT) 10 MG disintegrating tablet Take 10 mg by mouth 3  (three) times daily as needed for migraine. May repeat in 2 hours if needed 10/08/13  Yes Kathrynn Ducking, MD  simvastatin (ZOCOR) 20 MG tablet Take 20 mg by mouth every evening.   Yes Historical Provider, MD  topiramate (TOPAMAX) 100 MG tablet Take 100 mg by mouth 2 (two) times daily. 04/14/14  Yes Kathrynn Ducking, MD  zolpidem (AMBIEN) 10 MG tablet Take 10 mg by mouth at bedtime.   Yes Historical Provider, MD   BP 119/78  Pulse 82  Resp 14  SpO2 100%  Physical Exam  Nursing note and vitals reviewed. Constitutional: She is oriented to person, place, and time. She appears well-developed and well-nourished.  HENT:  Head: Normocephalic and atraumatic.  Mouth/Throat: Oropharynx  is clear and moist.  Eyes: Conjunctivae and EOM are normal. Pupils are equal, round, and reactive to light.  Neck: Normal range of motion.  Cardiovascular: Normal rate, regular rhythm and normal heart sounds.   Pulmonary/Chest: Effort normal and breath sounds normal. No respiratory distress. She has no wheezes.  Pain reproducible with palpation to left anterior and lateral chest wall without noted deformities; lungs CTAB  Abdominal: Soft. Bowel sounds are normal. There is no tenderness. There is no guarding.  Musculoskeletal: Normal range of motion.  LUE normal in appearance; full ROM of shoulder, elbow, and wrist; mild tenderness along bicep musculature; no asymmetry, erythema, warmth to touch, or bony deformities; no signs of tendon rupture; no variscosities present; strong radial pulse and cap refill; sensation intact diffusely throughout arm  Neurological: She is alert and oriented to person, place, and time.  Skin: Skin is warm and dry.  Psychiatric: She has a normal mood and affect.    ED Course  Procedures (including critical care time) Labs Review Labs Reviewed  CBC - Abnormal; Notable for the following:    RBC 3.85 (*)    All other components within normal limits  BASIC METABOLIC PANEL  PRO B  NATRIURETIC PEPTIDE  I-STAT TROPOININ, ED    Imaging Review Dg Chest 2 View  06/16/2014   CLINICAL DATA:  Left arm and leg pain.  EXAM: CHEST  2 VIEW  COMPARISON:  None.  FINDINGS: Normal cardiac and mediastinal contours. The lungs are clear. No pleural effusion or pneumothorax. Regional skeleton is unremarkable.  IMPRESSION: No acute cardiopulmonary process.   Electronically Signed   By: Lovey Newcomer M.D.   On: 06/16/2014 21:28     EKG Interpretation None      MDM   Final diagnoses:  Chest pain, unspecified chest pain type  Arm pain, left   38 year old female with chest pain and left shoulder/arm pain, seen in ED yesterday with negative workup. She returns today due to persistent symptoms. Reviewed notes from yesterday, story is largely unchanged.  Pt states she was told she needed a "scan" by ED physician however no mention of this in notes.  EKG remains NSR without ischemic changes.  Lab work obtained in triage which is reassuring. Patient had chest x-ray yesterday which is negative, she does not want repeat x-ray today. On exam, pain is reproducible with palpation to left anterior and lateral chest wall as well as left bicep region. There is no asymmetry, erythema, warmth to touch, or varicosities present to suggest DVT. Patient is PERC negative.  Low suspicion for ACS, PE, dissection, or other acute cardiac event at this time.  Low suspicion for DVT of LUE however pt remains concerned for this.  Given that vascular u/s not available at this hour, will schedule for OP study tomorrow morning.  Will hold lovenox for now given low suspicion. Pt will continue home pain meds and FU with PCP.  Discussed plan with patient, he/she acknowledged understanding and agreed with plan of care.  Return precautions given for new or worsening symptoms.  Larene Pickett, PA-C 06/17/14 [phone]

## 2014-06-17 NOTE — Telephone Encounter (Signed)
Noted  

## 2014-06-17 NOTE — ED Notes (Signed)
Pt presents from home with c/o chest pressure and left arm pain since yesterday. Pt reports shortness of breathe and nausea as well. Pt is alert and oriented x4 and ambulatory.

## 2014-06-18 ENCOUNTER — Telehealth: Payer: Self-pay | Admitting: Family

## 2014-06-18 ENCOUNTER — Ambulatory Visit (HOSPITAL_COMMUNITY)
Admission: RE | Admit: 2014-06-18 | Discharge: 2014-06-18 | Disposition: A | Discharge status: Home / Self Care | Payer: Medicare Other | Source: Ambulatory Visit | Attending: Emergency Medicine | Admitting: Emergency Medicine

## 2014-06-18 DIAGNOSIS — M79609 Pain in unspecified limb: Secondary | ICD-10-CM | POA: Insufficient documentation

## 2014-06-18 NOTE — Telephone Encounter (Signed)
We have no availability until next week. Abby Potash is out of the office tomorrow and leaving at 11:30 today. You may use the first available 30 min slot. Please do not use the two 15 min slots that are open on Monday, as Padonda has an afternoon meeting and those may need to be used for acute pts

## 2014-06-18 NOTE — Progress Notes (Signed)
VASCULAR LAB PRELIMINARY  PRELIMINARY  PRELIMINARY  PRELIMINARY  Left upper extremity venous duplex completed.    Preliminary report:  Left:  No evidence of DVT or superficial thrombosis.    Lamonica Trueba, RVT 06/18/2014, 9:48 AM

## 2014-06-18 NOTE — Telephone Encounter (Signed)
Pt went to the ER [facility] for sharpe pains in her left leg and arm. Pt said she was told at the ER she needed to see someone who deals with muscles. She also need a 30 minute OV and is requesting some pain medicine . Pt is requesting a appt asap. Can I use SDA if available .

## 2014-06-18 NOTE — ED Provider Notes (Signed)
Medical screening examination/treatment/procedure(s) were performed by non-physician practitioner and as supervising physician I was immediately available for consultation/collaboration.   EKG Interpretation None        Francine Graven, DO 06/18/14 2356

## 2014-06-23 ENCOUNTER — Other Ambulatory Visit: Payer: Self-pay | Admitting: Family

## 2014-06-23 DIAGNOSIS — M5412 Radiculopathy, cervical region: Secondary | ICD-10-CM | POA: Diagnosis not present

## 2014-06-24 ENCOUNTER — Other Ambulatory Visit: Payer: Self-pay | Admitting: *Deleted

## 2014-06-24 MED ORDER — MELOXICAM 15 MG PO TABS: 15.0000 mg | ORAL_TABLET | Freq: Every day | ORAL | Status: DC

## 2014-06-24 NOTE — Telephone Encounter (Signed)
S/w pt she said has seen an orthopedic. No longer need an appt

## 2014-06-24 NOTE — Telephone Encounter (Signed)
[facility] sent fax requesting mobic 15 mg #30 with 3 refills. Take one by mouth daily. Per dr Milinda Pointer refill mobic 15 mg #30 3 refills take one by mouth daily.

## 2014-06-25 ENCOUNTER — Other Ambulatory Visit: Payer: Self-pay | Admitting: *Deleted

## 2014-06-25 MED ORDER — MELOXICAM 15 MG PO TABS: 15.0000 mg | ORAL_TABLET | Freq: Every day | ORAL | Status: DC

## 2014-06-25 NOTE — Telephone Encounter (Signed)
[facility] [address] sent refill request for meloxicam 15 mg #30 with 3 refills. Per dr Milinda Pointer fill meloxicam.

## 2014-06-27 DIAGNOSIS — M542 Cervicalgia: Secondary | ICD-10-CM | POA: Diagnosis not present

## 2014-06-29 DIAGNOSIS — K589 Irritable bowel syndrome without diarrhea: Secondary | ICD-10-CM | POA: Diagnosis not present

## 2014-06-29 DIAGNOSIS — IMO0001 Reserved for inherently not codable concepts without codable children: Secondary | ICD-10-CM | POA: Diagnosis not present

## 2014-06-29 DIAGNOSIS — G43909 Migraine, unspecified, not intractable, without status migrainosus: Secondary | ICD-10-CM | POA: Diagnosis not present

## 2014-06-29 DIAGNOSIS — R079 Chest pain, unspecified: Secondary | ICD-10-CM | POA: Diagnosis not present

## 2014-06-30 ENCOUNTER — Ambulatory Visit (INDEPENDENT_AMBULATORY_CARE_PROVIDER_SITE_OTHER): Payer: Medicare Other | Admitting: Podiatry

## 2014-06-30 ENCOUNTER — Encounter: Payer: Self-pay | Admitting: Podiatry

## 2014-06-30 ENCOUNTER — Ambulatory Visit (INDEPENDENT_AMBULATORY_CARE_PROVIDER_SITE_OTHER): Payer: Medicare Other

## 2014-06-30 VITALS — BP 112/65 | HR 64 | Resp 17

## 2014-06-30 DIAGNOSIS — Z9889 Other specified postprocedural states: Secondary | ICD-10-CM | POA: Diagnosis not present

## 2014-06-30 DIAGNOSIS — M948X9 Other specified disorders of cartilage, unspecified sites: Secondary | ICD-10-CM

## 2014-06-30 DIAGNOSIS — M21611 Bunion of right foot: Secondary | ICD-10-CM

## 2014-06-30 DIAGNOSIS — M2580 Other specified joint disorders, unspecified joint: Secondary | ICD-10-CM

## 2014-06-30 DIAGNOSIS — M21619 Bunion of unspecified foot: Secondary | ICD-10-CM

## 2014-06-30 NOTE — Progress Notes (Signed)
   Subjective:    Patient ID: Molly [name], female    DOB: [birth year], 38 y.o.   MRN: [id number]  HPI  Pt presents with burning right toe pain. Pain comes when she applies direct pressure, this has been ongoing since surgery. She c/o stiffness at right great toe joint. Wants to discuss removal of scar and staple in right foot.   Review of Systems     Objective:   Physical Exam: She presents today strongly palpable pulses. She has a great range of motion first metatarsophalangeal joint of the right foot mild hallux malleus. No pain on range of motion of the first metatarsophalangeal joint. Radiographic evaluation demonstrates a well healing surgical foot. She has mild tenderness on palpation of the tibial sesamoid.        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  Plan: Injected 2 mg of dexamethasone to the point of maximal tenderness of the right foot today I will followup with her in 6-8 weeks.

## 2014-07-01 DIAGNOSIS — R079 Chest pain, unspecified: Secondary | ICD-10-CM | POA: Diagnosis not present

## 2014-07-08 ENCOUNTER — Telehealth: Payer: Self-pay | Admitting: Family

## 2014-07-08 NOTE — Telephone Encounter (Signed)
Caller: Bethaney/Patient; Phone: [phone]; Reason for Call: Has been having chest discomfort and arm pain; was seen by her cardiologist and told that everything was fine, so felt that the problems could be related to her cholesterol med; taking Simvastatin 1/2 tab daily; uses [facility]; wondering if the med could be changed to something else; please call with any questions

## 2014-07-09 NOTE — Telephone Encounter (Signed)
Left message to advise pt of note 

## 2014-07-09 NOTE — Telephone Encounter (Signed)
Decrease medication to every other day.

## 2014-07-09 NOTE — Telephone Encounter (Signed)
Please advise 

## 2014-07-10 ENCOUNTER — Telehealth: Payer: Self-pay | Admitting: Family

## 2014-07-10 NOTE — Telephone Encounter (Signed)
Pt aware, per Padonda, d/c simvastatin. Pt states that she still needs a cholesterol medication.   Pt also states that according to her insurance company her CPX labs were coded incorrectly causing her to get a bill for $266. She states that she was told if PCP changes code to "medically necessary" then medicare and medicaid will cover the cost.   Please advise

## 2014-07-10 NOTE — Telephone Encounter (Signed)
Pt would like tamesha to return her call concerning simvastatin med

## 2014-07-13 NOTE — Telephone Encounter (Signed)
Can we forward to billing for advice

## 2014-07-17 NOTE — Telephone Encounter (Signed)
Pt states billing told her that we needed to correct the mistake and states it needs to say medically necessary. This was an annual.  Pt also states she still needs cholesterol med.

## 2014-07-21 NOTE — Telephone Encounter (Signed)
Needs to have cholesterol rechecked at this point. It has been nearly 6 months ago.

## 2014-07-21 NOTE — Telephone Encounter (Signed)
What other cholesterol medication do you recommend? And please see previous note from [place] about billing

## 2014-07-22 NOTE — Telephone Encounter (Signed)
Pt would like to wait until billing issue is worked out before she has any labs done. She states that if it is not corrected then she will have to pay $300 and she does not have that. I will ask Derinda Late to look into the billing issue.

## 2014-08-04 ENCOUNTER — Encounter: Payer: Self-pay | Admitting: Podiatry

## 2014-08-04 ENCOUNTER — Ambulatory Visit (INDEPENDENT_AMBULATORY_CARE_PROVIDER_SITE_OTHER): Payer: Medicare Other | Admitting: Podiatry

## 2014-08-04 VITALS — BP 161/92 | HR 86 | Resp 16

## 2014-08-04 DIAGNOSIS — M2580 Other specified joint disorders, unspecified joint: Secondary | ICD-10-CM

## 2014-08-04 DIAGNOSIS — Z9889 Other specified postprocedural states: Secondary | ICD-10-CM

## 2014-08-04 DIAGNOSIS — M948X9 Other specified disorders of cartilage, unspecified sites: Secondary | ICD-10-CM

## 2014-08-04 MED ORDER — IBUPROFEN 600 MG PO TABS: 600.0000 mg | ORAL_TABLET | Freq: Three times a day (TID) | ORAL | Status: DC | PRN

## 2014-08-04 NOTE — Progress Notes (Signed)
Molly [name] presents today after having her surgery performed for her right foot in June of 2014. She states that the injection to the sesamoid felt much better after her last visit. She still has swelling just recently however after wearing a pair flip-flops and ended up pulling her toe with a popping noise.  Objective: Vital signs are stable she is alert and oriented x3 there is no erythema cellulitis drainage or odor. She still has a hypertrophic scar that itches overlying the incision site. However she's got a good range of motion of the first metatarsophalangeal joint mild plantar flexion at the IP joint. She would like to consider surgical excision of this scar in the near future.  Assessment: Sesamoiditis well-healing surgical foot right foot.  Plan: Discussed etiology pathology conservative versus surgical therapies at this point put her on an NSAID 600 mg of Motrin 3 times a day will followup with her in 6 weeks.

## 2014-08-05 ENCOUNTER — Ambulatory Visit: Payer: Medicare Other | Admitting: Cardiology

## 2014-08-12 ENCOUNTER — Other Ambulatory Visit: Payer: Self-pay | Admitting: Family

## 2014-08-14 ENCOUNTER — Encounter: Payer: Self-pay | Admitting: Adult Health

## 2014-08-14 ENCOUNTER — Encounter (INDEPENDENT_AMBULATORY_CARE_PROVIDER_SITE_OTHER): Payer: Self-pay

## 2014-08-14 ENCOUNTER — Ambulatory Visit (INDEPENDENT_AMBULATORY_CARE_PROVIDER_SITE_OTHER): Payer: Medicare Other | Admitting: Adult Health

## 2014-08-14 VITALS — BP 119/78 | HR 79 | Ht 68.0 in | Wt 161.0 lb

## 2014-08-14 DIAGNOSIS — F329 Major depressive disorder, single episode, unspecified: Secondary | ICD-10-CM | POA: Diagnosis not present

## 2014-08-14 DIAGNOSIS — F32A Depression, unspecified: Secondary | ICD-10-CM

## 2014-08-14 DIAGNOSIS — R51 Headache: Secondary | ICD-10-CM

## 2014-08-14 DIAGNOSIS — R569 Unspecified convulsions: Secondary | ICD-10-CM | POA: Diagnosis not present

## 2014-08-14 DIAGNOSIS — G8929 Other chronic pain: Secondary | ICD-10-CM

## 2014-08-14 MED ORDER — LACOSAMIDE 50 MG PO TABS: ORAL_TABLET | ORAL | Status: DC
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
## 2014-08-14 NOTE — Progress Notes (Signed)
I have read the note, and I agree with the clinical assessment and plan.  Molly [name],Molly [name]   

## 2014-08-14 NOTE — Patient Instructions (Signed)
Please get your medical records and have them faxed to [place].

## 2014-08-20 NOTE — Telephone Encounter (Signed)
error 

## 2014-09-05 ENCOUNTER — Other Ambulatory Visit: Payer: Self-pay | Admitting: Family

## 2014-09-10 ENCOUNTER — Ambulatory Visit: Payer: Medicare Other | Admitting: Podiatry

## 2014-09-10 DIAGNOSIS — L600 Ingrowing nail: Secondary | ICD-10-CM

## 2014-09-10 MED ORDER — NEOMYCIN-POLYMYXIN-HC 3.5-10000-1 OT SOLN: OTIC | Status: DC

## 2014-09-10 MED ORDER — OXYCODONE-ACETAMINOPHEN 10-325 MG PO TABS: 1.0000 | ORAL_TABLET | Freq: Every day | ORAL | Status: DC | PRN

## 2014-09-10 NOTE — Patient Instructions (Signed)

## 2014-09-10 NOTE — Progress Notes (Signed)
She presents today with a chief complaint of an ingrown nail tibial border hallux right. She states that the lateral border becomes painful as well.  Objective: Vital signs are stable she is alert and oriented 3. She has sharp incurvated nails along the tibial and fibular borders of the hallux right. Mild erythema granulation tissue is present. Pulses are strongly palpable right foot.  Assessment: Pain in limb secondary to ingrown nail paronychia abscess hallux bilateral.  Plan: Perform chemical matrixectomy's the tibial and fibular border of the hallux right today is performed up to 3 mL of a 50-50 mixture of Marcaine plain lidocaine plain was infiltrated in a hallux block right. The nails were split from distal to proximal avulsed exposing the matrix along the tibiofibular border. This was neutralized with phenol and isopropyl alcohol. Telfa pad and a dry sterile compressive dressing was provided. She also received a prescription for Cortisporin Otic which she was started applying on a twice a day basis after soaking twice daily. I will follow up with her in 1 week to make sure she is healing well.

## 2014-09-15 ENCOUNTER — Ambulatory Visit: Payer: Medicare Other | Admitting: Podiatry

## 2014-09-24 ENCOUNTER — Ambulatory Visit (INDEPENDENT_AMBULATORY_CARE_PROVIDER_SITE_OTHER): Payer: Medicare Other | Admitting: Podiatry

## 2014-09-24 VITALS — BP 142/88 | HR 89 | Resp 16

## 2014-09-24 DIAGNOSIS — Z9889 Other specified postprocedural states: Secondary | ICD-10-CM

## 2014-09-25 NOTE — Progress Notes (Signed)
She presents today for follow-up of matrixectomy to the hallux right. She states that she's been soaking twice daily and Betadine and water. She states that it is mildly tender.  Objective: Vital signs are stable she is alert and oriented 3. Pulses are palpable. Mild serosanguineous drainage but no erythema.  Assessment: Well-healing matrixectomy hallux right.  Plan: Discontinue Betadine sterile with Epsom salts warm water soaks, regarding the daily Lopid at night follow-up with me in a couple weeks.

## 2014-10-06 ENCOUNTER — Other Ambulatory Visit: Payer: Self-pay | Admitting: Family

## 2014-10-06 ENCOUNTER — Ambulatory Visit: Payer: Medicare Other | Admitting: Podiatry

## 2014-10-15 ENCOUNTER — Ambulatory Visit (INDEPENDENT_AMBULATORY_CARE_PROVIDER_SITE_OTHER): Payer: Medicare Other | Admitting: Podiatry

## 2014-10-15 VITALS — BP 122/88 | HR 80 | Resp 16

## 2014-10-15 DIAGNOSIS — Z9889 Other specified postprocedural states: Secondary | ICD-10-CM

## 2014-10-15 DIAGNOSIS — M2012 Hallux valgus (acquired), left foot: Secondary | ICD-10-CM

## 2014-10-15 NOTE — Progress Notes (Signed)
She presents today with a chief complaint of a painful first metatarsophalangeal joint and interphalangeal joint of the hallux left. She states this seems to getting worse as time goes on and I will like to see about getting this fixed if at all possible.  Objective: Vital signs are stable she's alert and oriented 3. There is no erythema edema cellulitis drainage or odor that she does have pain on palpation of the first metatarsophalangeal joint of the left foot. Hallux interphalangeal was also noted with overlapping second digit. Radiographs were reviewed and confirmed. Pulses are palpable left foot.  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  Plan: We went over a consent form today number by number giving her ample time to ask questions and she is often regarding a McBride bunion repair and an Akin osteotomy. We discussed the possible postop complications which may include but are not limited to postop pain bleeding swelling infection need for further surgery also digit loss of limb loss of life buildup of scar tissue. We also discussed injecting the scar on her right foot to help decrease the thickness. She understands this is amenable to it has had a contralateral foot performed and I will follow-up with her in the near future for surgical intervention.

## 2014-10-20 ENCOUNTER — Emergency Department (HOSPITAL_COMMUNITY)
Admission: EM | Admit: 2014-10-20 | Discharge: 2014-10-20 | Discharge status: Against Medical Advice | Payer: Medicare Other | Attending: Emergency Medicine | Admitting: Emergency Medicine

## 2014-10-20 ENCOUNTER — Encounter (HOSPITAL_COMMUNITY): Payer: Self-pay | Admitting: Physical Medicine and Rehabilitation

## 2014-10-20 DIAGNOSIS — R0789 Other chest pain: Secondary | ICD-10-CM | POA: Diagnosis not present

## 2014-10-20 DIAGNOSIS — R079 Chest pain, unspecified: Secondary | ICD-10-CM

## 2014-10-20 DIAGNOSIS — Z3202 Encounter for pregnancy test, result negative: Secondary | ICD-10-CM | POA: Insufficient documentation

## 2014-10-20 DIAGNOSIS — R0602 Shortness of breath: Secondary | ICD-10-CM | POA: Diagnosis not present

## 2014-10-20 LAB — BASIC METABOLIC PANEL
Anion gap: 12 (ref 5–15)
BUN: 12 mg/dL (ref 6–23)
CALCIUM: 9.7 mg/dL (ref 8.4–10.5)
CO2: 27 mEq/L (ref 19–32)
Chloride: 101 mEq/L (ref 96–112)
Creatinine, Ser: 0.87 mg/dL (ref 0.50–1.10)
GFR calc Af Amer: >90 mL/min (ref >90)
GFR calc non Af Amer: 85 mL/min — ABNORMAL LOW (ref >90)
Glucose, Bld: 88 mg/dL (ref 70–99)
Potassium: 4 mEq/L (ref 3.7–5.3)
SODIUM: 140 meq/L (ref 137–147)

## 2014-10-20 LAB — I-STAT TROPONIN, ED: TROPONIN I, POC: 0 ng/mL (ref 0.00–0.08)

## 2014-10-20 LAB — CBC WITH DIFFERENTIAL/PLATELET
Basophils Absolute: 0 10*3/uL (ref 0.0–0.1)
Basophils Relative: 0 % (ref 0–1)
Eosinophils Absolute: 0.1 10*3/uL (ref 0.0–0.7)
Eosinophils Relative: 1 % (ref 0–5)
HCT: 35.4 % — ABNORMAL LOW (ref 36.0–46.0)
Hemoglobin: 11.5 g/dL — ABNORMAL LOW (ref 12.0–15.0)
LYMPHS ABS: 2.2 10*3/uL (ref 0.7–4.0)
Lymphocytes Relative: 33 % (ref 12–46)
MCH: 29.9 pg (ref 26.0–34.0)
MCHC: 32.5 g/dL (ref 30.0–36.0)
MCV: 91.9 fL (ref 78.0–100.0)
Monocytes Absolute: 0.4 10*3/uL (ref 0.1–1.0)
Monocytes Relative: 6 % (ref 3–12)
Neutro Abs: 4.1 10*3/uL (ref 1.7–7.7)
Neutrophils Relative %: 60 % (ref 43–77)
PLATELETS: 201 10*3/uL (ref 150–400)
RBC: 3.85 MIL/uL — AB (ref 3.87–5.11)
RDW: 12.3 % (ref 11.5–15.5)
WBC: 6.8 10*3/uL (ref 4.0–10.5)

## 2014-10-20 LAB — POC URINE PREG, ED: Preg Test, Ur: NEGATIVE

## 2014-10-20 LAB — URINALYSIS, ROUTINE W REFLEX MICROSCOPIC
BILIRUBIN URINE: NEGATIVE
Glucose, UA: NEGATIVE mg/dL
Hgb urine dipstick: NEGATIVE
Ketones, ur: NEGATIVE mg/dL
Nitrite: NEGATIVE
PH: 6 (ref 5.0–8.0)
Protein, ur: NEGATIVE mg/dL
Specific Gravity, Urine: 1.01 (ref 1.005–1.030)
UROBILINOGEN UA: 0.2 mg/dL (ref 0.0–1.0)

## 2014-10-20 LAB — URINE MICROSCOPIC-ADD ON
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
## 2014-10-25 ENCOUNTER — Encounter (HOSPITAL_BASED_OUTPATIENT_CLINIC_OR_DEPARTMENT_OTHER): Payer: Self-pay

## 2014-10-25 ENCOUNTER — Emergency Department (HOSPITAL_BASED_OUTPATIENT_CLINIC_OR_DEPARTMENT_OTHER): Payer: Medicare Other

## 2014-10-25 ENCOUNTER — Emergency Department (HOSPITAL_BASED_OUTPATIENT_CLINIC_OR_DEPARTMENT_OTHER)
Admission: EM | Admit: 2014-10-25 | Discharge: 2014-10-25 | Disposition: A | Discharge status: Home / Self Care | Payer: Medicare Other | Attending: Emergency Medicine | Admitting: Emergency Medicine

## 2014-10-25 DIAGNOSIS — R1084 Generalized abdominal pain: Secondary | ICD-10-CM | POA: Diagnosis not present

## 2014-10-25 DIAGNOSIS — E780 Pure hypercholesterolemia: Secondary | ICD-10-CM | POA: Diagnosis not present

## 2014-10-25 DIAGNOSIS — Z7982 Long term (current) use of aspirin: Secondary | ICD-10-CM | POA: Diagnosis not present

## 2014-10-25 DIAGNOSIS — R1013 Epigastric pain: Secondary | ICD-10-CM | POA: Insufficient documentation

## 2014-10-25 DIAGNOSIS — Z791 Long term (current) use of non-steroidal anti-inflammatories (NSAID): Secondary | ICD-10-CM | POA: Diagnosis not present

## 2014-10-25 DIAGNOSIS — G43909 Migraine, unspecified, not intractable, without status migrainosus: Secondary | ICD-10-CM | POA: Insufficient documentation

## 2014-10-25 DIAGNOSIS — Z792 Long term (current) use of antibiotics: Secondary | ICD-10-CM | POA: Insufficient documentation

## 2014-10-25 DIAGNOSIS — R079 Chest pain, unspecified: Secondary | ICD-10-CM | POA: Diagnosis present

## 2014-10-25 DIAGNOSIS — Z90710 Acquired absence of both cervix and uterus: Secondary | ICD-10-CM | POA: Diagnosis not present

## 2014-10-25 DIAGNOSIS — R197 Diarrhea, unspecified: Secondary | ICD-10-CM | POA: Diagnosis not present

## 2014-10-25 DIAGNOSIS — R0789 Other chest pain: Secondary | ICD-10-CM | POA: Diagnosis not present

## 2014-10-25 DIAGNOSIS — Z79899 Other long term (current) drug therapy: Secondary | ICD-10-CM | POA: Diagnosis not present

## 2014-10-25 DIAGNOSIS — R0602 Shortness of breath: Secondary | ICD-10-CM | POA: Insufficient documentation

## 2014-10-25 DIAGNOSIS — Z7951 Long term (current) use of inhaled steroids: Secondary | ICD-10-CM | POA: Insufficient documentation

## 2014-10-25 DIAGNOSIS — Z9851 Tubal ligation status: Secondary | ICD-10-CM | POA: Diagnosis not present

## 2014-10-25 DIAGNOSIS — G40909 Epilepsy, unspecified, not intractable, without status epilepticus: Secondary | ICD-10-CM | POA: Diagnosis not present

## 2014-10-25 DIAGNOSIS — R072 Precordial pain: Secondary | ICD-10-CM | POA: Diagnosis not present

## 2014-10-25 DIAGNOSIS — K219 Gastro-esophageal reflux disease without esophagitis: Secondary | ICD-10-CM | POA: Diagnosis not present

## 2014-10-25 DIAGNOSIS — R05 Cough: Secondary | ICD-10-CM | POA: Diagnosis not present

## 2014-10-25 DIAGNOSIS — R109 Unspecified abdominal pain: Secondary | ICD-10-CM

## 2014-10-25 DIAGNOSIS — R112 Nausea with vomiting, unspecified: Secondary | ICD-10-CM | POA: Diagnosis not present

## 2014-10-25 LAB — CBC WITH DIFFERENTIAL/PLATELET
BASOS PCT: 0 % (ref 0–1)
Basophils Absolute: 0 10*3/uL (ref 0.0–0.1)
Eosinophils Absolute: 0.1 10*3/uL (ref 0.0–0.7)
Eosinophils Relative: 1 % (ref 0–5)
HEMATOCRIT: 33.8 % — AB (ref 36.0–46.0)
HEMOGLOBIN: 11.2 g/dL — AB (ref 12.0–15.0)
LYMPHS ABS: 2.2 10*3/uL (ref 0.7–4.0)
Lymphocytes Relative: 36 % (ref 12–46)
MCH: 31.5 pg (ref 26.0–34.0)
MCHC: 33.1 g/dL (ref 30.0–36.0)
MCV: 95.2 fL (ref 78.0–100.0)
MONO ABS: 0.5 10*3/uL (ref 0.1–1.0)
MONOS PCT: 8 % (ref 3–12)
NEUTROS ABS: 3.3 10*3/uL (ref 1.7–7.7)
Neutrophils Relative %: 55 % (ref 43–77)
Platelets: 202 10*3/uL (ref 150–400)
RBC: 3.55 MIL/uL — AB (ref 3.87–5.11)
RDW: 11.9 % (ref 11.5–15.5)
WBC: 6 10*3/uL (ref 4.0–10.5)

## 2014-10-25 LAB — COMPREHENSIVE METABOLIC PANEL
ALBUMIN: 3.8 g/dL (ref 3.5–5.2)
ALT: 13 U/L (ref 0–35)
AST: 17 U/L (ref 0–37)
Alkaline Phosphatase: 98 U/L (ref 39–117)
Anion gap: 12 (ref 5–15)
BUN: 9 mg/dL (ref 6–23)
CO2: 28 mEq/L (ref 19–32)
Calcium: 9.4 mg/dL (ref 8.4–10.5)
Chloride: 101 mEq/L (ref 96–112)
Creatinine, Ser: 0.9 mg/dL (ref 0.50–1.10)
GFR calc Af Amer: >90 mL/min (ref >90)
GFR calc non Af Amer: 81 mL/min — ABNORMAL LOW (ref >90)
Glucose, Bld: 139 mg/dL — ABNORMAL HIGH (ref 70–99)
POTASSIUM: 3.8 meq/L (ref 3.7–5.3)
SODIUM: 141 meq/L (ref 137–147)
TOTAL PROTEIN: 7.8 g/dL (ref 6.0–8.3)
Total Bilirubin: 0.2 mg/dL — ABNORMAL LOW (ref 0.3–1.2)

## 2014-10-25 LAB — LIPASE, BLOOD: Lipase: 30 U/L (ref 11–59)

## 2014-10-25 LAB — TROPONIN I: Troponin I: <0.3 ng/mL (ref <0.30)

## 2014-10-25 MED ORDER — HYOSCYAMINE SULFATE 0.125 MG SL SUBL
0.1250 mg | SUBLINGUAL_TABLET | Freq: Once | SUBLINGUAL | Status: AC
  Administered 2014-10-25: 0.125 mg via SUBLINGUAL
  Filled 2014-10-25: qty 1

## 2014-10-25 MED ORDER — HYDROMORPHONE HCL 1 MG/ML IJ SOLN: 1.0000 mg | Freq: Once | INTRAMUSCULAR | Status: DC

## 2014-10-25 MED ORDER — HYOSCYAMINE SULFATE 0.125 MG PO TABS
ORAL_TABLET | ORAL | Status: AC
  Filled 2014-10-25: qty 1

## 2014-10-25 MED ORDER — GI COCKTAIL ~~LOC~~
30.0000 mL | Freq: Once | ORAL | Status: AC
  Administered 2014-10-25: 30 mL via ORAL
  Filled 2014-10-25: qty 30

## 2014-10-25 NOTE — ED Notes (Signed)
Patient refuses IV stick at present, requesting blood draw only.

## 2014-10-25 NOTE — ED Provider Notes (Signed)
CSN: [id number]     Arrival date & time 10/25/14  1746 History  This chart was scribed for Quintella Reichert, MD by Tula Nakayama, ED Scribe. This patient was seen in room MH10/MH10 and the patient's care was started at 6:08 PM     Chief Complaint  Patient presents with  . Chest Pain   The history is provided by the patient. No language interpreter was used.    HPI Comments: Molly [name] is a 38 y.o. female with a history of hysterectomy, GERD, migraines and seizure who presents to the Emergency Department complaining of intermittent episodes of sharp, stabbing, nonradiating substernal CP that started 17 hours ago and became more frequent 8 hours ago. Pt states difficulty breathing with the pain as an associated symptom. She notes pain becomes worse with eating and swallowing. Pt also reports 1 episode of left-sided back pain that occurred 2 days ago, but is resolved today. She denies history of DM, PE/DVT, CAD and HTN, but notes her father had his first MI at 79 y.o. Pt denies smoking, EtOH use or drug abuse. She also denies fever, cough, vomiting, diarrhea, nausea, dysuria, leg swelling and abdominal pain as associated symptoms.  Past Medical History  Diagnosis Date  . Migraine   . GERD (gastroesophageal reflux disease)   . Lumbago   . Seizures     last seizure nov 2014  . Allergic rhinitis   . Fibromyalgia   . Carpal tunnel syndrome on both sides   . High cholesterol    Past Surgical History  Procedure Laterality Date  . Abdominal hysterectomy    . Tubal ligation    . Foot surgery      bilateral, toe surgery  . Bunionectomy Right 11-07-2013  . Aiken osteotomy Right 11-07-2013   Family History  Problem Relation Age of Onset  . Seizures Mother   . Emphysema Father   . Seizures Maternal Grandmother   . Seizures Sister   . Bipolar disorder Sister    History  Substance Use Topics  . Smoking status: Never Smoker   . Smokeless tobacco: Never Used  . Alcohol Use: No   OB  History    No data available     Review of Systems  Constitutional: Negative for fever.  Respiratory: Positive for shortness of breath. Negative for cough.   Cardiovascular: Positive for chest pain. Negative for leg swelling.  Gastrointestinal: Negative for nausea, vomiting, abdominal pain and diarrhea.  Genitourinary: Negative for dysuria.  All other systems reviewed and are negative.     Allergies  Fish allergy; Phenergan; and Toradol  Home Medications   Prior to Admission medications   Medication Sig Start Date End Date Taking? Authorizing Provider  ammonium lactate (LAC-HYDRIN) 12 % lotion Apply 1 application topically as needed for dry skin. 11/27/13   Max T Hyatt, DPM  aspirin 325 MG tablet Take 325 mg by mouth daily.    Historical Provider, MD  azithromycin (ZITHROMAX) 250 MG tablet Take 250 mg by mouth daily. Unknown start date 04/27/14   Timoteo Gaul, FNP  cetirizine (ZYRTEC) 10 MG tablet Take 10 mg by mouth daily.    Historical Provider, MD  cholecalciferol (VITAMIN D) 1000 UNITS tablet Take 1,000 Units by mouth daily. 06/01/14   Timoteo Gaul, FNP  cyclobenzaprine (FLEXERIL) 10 MG tablet TAKE 1 TABLET BY MOUTH 3 TIMES A DAY AS NEEDED FOR SPASMS    Timoteo Gaul, FNP  dicyclomine (BENTYL) 10 MG capsule Take 10 mg by  mouth daily. 02/09/14   Historical Provider, MD  econazole nitrate 1 % cream Apply topically daily. 11/27/13   Max T Hyatt, DPM  escitalopram (LEXAPRO) 10 MG tablet TAKE 1 TABLET EVERY DAY 03/16/14   Timoteo Gaul, FNP  esomeprazole (NEXIUM) 40 MG capsule Take 40 mg by mouth daily at 12 noon.    Historical Provider, MD  fluticasone (FLONASE) 50 MCG/ACT nasal spray USE 2 SPRAYS IN EACH NOSTRIL ONCE DAILY 10/06/14   Timoteo Gaul, FNP  fluticasone (VERAMYST) 27.5 MCG/SPRAY nasal spray Place 2 sprays into the nose daily.    Historical Provider, MD  HYDROcodone-acetaminophen (NORCO/VICODIN) 5-325 MG per tablet Take 2 tablets by mouth every 4  (four) hours as needed for moderate pain. 04/30/13   Orpah Greek, MD  ibuprofen (ADVIL,MOTRIN) 600 MG tablet Take 1 tablet (600 mg total) by mouth every 8 (eight) hours as needed. 08/04/14   Max T Hyatt, DPM  lacosamide (VIMPAT) 50 MG TABS tablet Take 1 tablet by mouth twice a day for 1 week then increase to 2 tablets by mouth twice a day. 08/14/14   Ward Givens, NP  Linaclotide (LINZESS) 145 MCG CAPS capsule Take 145 mcg by mouth daily. 03/04/14   Milus Banister, MD  meloxicam (MOBIC) 15 MG tablet Take 1 tablet (15 mg total) by mouth daily. 06/25/14   Max T Hyatt, DPM  metoCLOPramide (REGLAN) 10 MG tablet Take 10 mg by mouth every 6 (six) hours as needed for nausea.    Historical Provider, MD  mirtazapine (REMERON) 30 MG tablet Take 30 mg by mouth at bedtime.    Historical Provider, MD  Multiple Vitamin (MULTIVITAMIN) capsule Take 1 capsule by mouth daily.    Historical Provider, MD  naproxen (NAPRELAN) 500 MG 24 hr tablet Take 750 mg by mouth daily with breakfast.    Historical Provider, MD  neomycin-polymyxin-hydrocortisone (CORTISPORIN) otic solution 1-2 drops to the toe after soaking twice daily 09/10/14   Max T Hyatt, DPM  olopatadine (PATANOL) 0.1 % ophthalmic solution Place 1 drop into both eyes 2 (two) times daily.    Historical Provider, MD  ondansetron (ZOFRAN) 8 MG tablet Take 8 mg by mouth 3 (three) times daily as needed for nausea or vomiting.    Historical Provider, MD  oxyCODONE-acetaminophen (PERCOCET) 10-325 MG per tablet Take 1 tablet by mouth daily as needed for pain. 09/10/14   Max T Hyatt, DPM  polyethylene glycol (MIRALAX / GLYCOLAX) packet MIX CONTENTS OF 1 PACKET WITH 4 TO 8 OUNCES OF LIQUID AND DRINK EVERY DAY    Timoteo Gaul, FNP  rizatriptan (MAXALT-MLT) 10 MG disintegrating tablet Take 10 mg by mouth 3 (three) times daily as needed for migraine. May repeat in 2 hours if needed 10/08/13   Kathrynn Ducking, MD  simvastatin (ZOCOR) 20 MG tablet Take 20 mg by  mouth every evening.    Historical Provider, MD  topiramate (TOPAMAX) 100 MG tablet Take 100 mg by mouth 2 (two) times daily. 04/14/14   Kathrynn Ducking, MD  zolpidem (AMBIEN) 10 MG tablet Take 10 mg by mouth at bedtime.    Historical Provider, MD  zolpidem (AMBIEN) 5 MG tablet TAKE 1 TABLET AT BEDTIME AS NEEDED FOR SLEEP 06/24/14   Timoteo Gaul, FNP   BP 132/57 mmHg  Pulse 87  Temp(Src) 98.4 F (36.9 C) (Oral)  Resp 18  Ht 5\' 9"  (1.753 m)  Wt 163 lb (73.936 kg)  BMI 24.06 kg/m2  SpO2 100% Physical  Exam  Constitutional: She is oriented to person, place, and time. She appears well-developed and well-nourished.  HENT:  Head: Normocephalic and atraumatic.  Cardiovascular: Normal rate and regular rhythm.   No murmur heard. Pulmonary/Chest: Effort normal and breath sounds normal. No respiratory distress.  Abdominal: Soft. There is no rebound and no guarding.  Moderate diffuse abdominal tenderness, greatest over the epigastrum without guarding or rebound tenderness  Musculoskeletal: She exhibits no edema or tenderness.  Neurological: She is alert and oriented to person, place, and time.  Skin: Skin is warm and dry.  Psychiatric: She has a normal mood and affect. Her behavior is normal.  Nursing note and vitals reviewed.   ED Course  Procedures (including critical care time) DIAGNOSTIC STUDIES: Oxygen Saturation is 100% on RA, normal by my interpretation.    COORDINATION OF CARE: 6:14 PM Discussed treatment plan with pt which includes lab work. Pt agreed to plan.  Labs Review Labs Reviewed  COMPREHENSIVE METABOLIC PANEL - Abnormal; Notable for the following:    Glucose, Bld 139 (*)    Total Bilirubin 0.2 (*)    GFR calc non Af Amer 81 (*)    All other components within normal limits  CBC WITH DIFFERENTIAL - Abnormal; Notable for the following:    RBC 3.55 (*)    Hemoglobin 11.2 (*)    HCT 33.8 (*)    All other components within normal limits  TROPONIN I  LIPASE, BLOOD     Imaging Review Dg Abd Acute W/chest  10/25/2014   CLINICAL DATA:  Intermittent substernal chest pain with difficulty breathing. Left-sided back pain with fever, cough, vomiting, diarrhea and nausea. Initial encounter.  EXAM: ACUTE ABDOMEN SERIES (ABDOMEN 2 VIEW & CHEST 1 VIEW)  COMPARISON:  Chest radiographs 06/16/2014.  Abdominal CT 12/18/2009.  FINDINGS: The heart size and mediastinal contours are normal. The lungs are clear. There is no pleural effusion or pneumothorax. No acute osseous findings are identified.  The bowel gas pattern is nonobstructive. There is moderate stool throughout the colon without wall thickening or free intraperitoneal air. There are no suspicious abdominal calcifications. Small pelvic calcifications are grossly stable, corresponding with phleboliths on prior CT. No acute osseous findings demonstrated.  IMPRESSION: No acute cardiopulmonary or abdominal process. Moderate stool throughout the colon suggesting constipation.   Electronically Signed   By: Camie Patience M.D.   On: 10/25/2014 19:51     EKG Interpretation   Date/Time:  Sunday October 25 2014 17:51:43 EST Ventricular Rate:  87 PR Interval:  142 QRS Duration: 84 QT Interval:  362 QTC Calculation: 435 R Axis:   72 Text Interpretation:  Normal sinus rhythm with sinus arrhythmia  Nonspecific T wave abnormality Abnormal ECG Confirmed by Hazle Coca [phone])  on 10/25/2014 6:21:28 PM      MDM   Final diagnoses:  Other chest pain  Abdominal pain, unspecified abdominal location   Patient here for evaluation of chest pain/epigastric abdominal pain. Evaluation patient stated that her pain started 17 hours prior to arrival, but on recheck patient states that this has been intermittent for the last few months. Patient is a difficult IV stick. Offered patient IM medications for pain and patient declines. Clinical picture is consistent with constipation. Discussed with patient home care for constipation as well as  outpatient GI follow-up. Clinical picture is not consistent with ACS, PE, SBO, cholecystitis, appendicitis, perforated viscous.   I personally performed the services described in this documentation, which was scribed in my presence. The recorded information  has been reviewed and is accurate.     Quintella Reichert, MD 10/26/14 Laureen Abrahams

## 2014-10-25 NOTE — Discharge Instructions (Signed)

## 2014-10-27 ENCOUNTER — Telehealth: Payer: Self-pay | Admitting: Gastroenterology

## 2014-10-27 MED ORDER — LINACLOTIDE 145 MCG PO CAPS: 145.0000 ug | ORAL_CAPSULE | Freq: Every day | ORAL | Status: DC

## 2014-10-27 NOTE — Telephone Encounter (Signed)
Refill sent as requested. 

## 2014-10-31 ENCOUNTER — Other Ambulatory Visit: Payer: Self-pay | Admitting: Podiatry

## 2014-10-31 MED ORDER — OXYCODONE-ACETAMINOPHEN 10-325 MG PO TABS: ORAL_TABLET | ORAL | Status: DC

## 2014-10-31 MED ORDER — CEPHALEXIN 500 MG PO CAPS: 500.0000 mg | ORAL_CAPSULE | Freq: Three times a day (TID) | ORAL | Status: DC

## 2014-11-02 ENCOUNTER — Encounter: Payer: Self-pay | Admitting: Podiatry

## 2014-11-02 DIAGNOSIS — M2032 Hallux varus (acquired), left foot: Secondary | ICD-10-CM | POA: Diagnosis not present

## 2014-11-02 DIAGNOSIS — M2012 Hallux valgus (acquired), left foot: Secondary | ICD-10-CM | POA: Diagnosis not present

## 2014-11-02 DIAGNOSIS — M25572 Pain in left ankle and joints of left foot: Secondary | ICD-10-CM | POA: Diagnosis not present

## 2014-11-02 DIAGNOSIS — L910 Hypertrophic scar: Secondary | ICD-10-CM | POA: Diagnosis not present

## 2014-11-02 DIAGNOSIS — K219 Gastro-esophageal reflux disease without esophagitis: Secondary | ICD-10-CM | POA: Diagnosis not present

## 2014-11-03 ENCOUNTER — Other Ambulatory Visit: Payer: Self-pay | Admitting: Family

## 2014-11-03 ENCOUNTER — Telehealth: Payer: Self-pay | Admitting: Family

## 2014-11-03 ENCOUNTER — Telehealth: Payer: Self-pay | Admitting: *Deleted

## 2014-11-03 MED ORDER — IBUPROFEN 800 MG PO TABS: 800.0000 mg | ORAL_TABLET | Freq: Three times a day (TID) | ORAL | Status: DC | PRN

## 2014-11-03 NOTE — Telephone Encounter (Signed)
Needs OV.  

## 2014-11-03 NOTE — Telephone Encounter (Signed)
Pt request refill  cyclobenzaprine (FLEXERIL) 10 MG tablet escitalopram (LEXAPRO) 10 MG tablet ibuprofen (ADVIL,MOTRIN) 600 MG tablet metoCLOPramide (REGLAN) 10 MG tablet mirtazapine (REMERON) 30 MG tablet naproxen (NAPRELAN) 500 MG 24 hr tablet ondansetron (ZOFRAN) 8 MG tablet polyethylene glycol (MIRALAX / GLYCOLAX)  rizatriptan (MAXALT-MLT) 10 MG disintegrating tablet zolpidem (AMBIEN)  Pt asked to go through her med list w/ her and she requested all these meds. Not sure if all are  Pt also requested  oxyCODONE-acetaminophen (PERCOCET) 10-325 MG per tablet  But I informed pt this was done 12/19 at [facility])  [facility] [address]

## 2014-11-03 NOTE — Telephone Encounter (Addendum)
Pt request refill Motrin 800mg , and asked if should be taking Aspirin 325mg  like she did with the last foot surgery.  I told pt I would check with Dr. Milinda Pointer.  Dr. Milinda Pointer ordered Motrin 800mg  #90 one tablet tid, and continue Aspirin 325mg  daily.  Orders called to pt.

## 2014-11-03 NOTE — Telephone Encounter (Signed)
Pt states she needed  appt this week bc of her insurance. Schedule in open slot for 10:45 am wed.

## 2014-11-03 NOTE — Telephone Encounter (Signed)
Noted  

## 2014-11-04 ENCOUNTER — Ambulatory Visit: Payer: Medicare Other | Admitting: Family

## 2014-11-04 NOTE — Progress Notes (Signed)
Dr Milinda Pointer performed a left Mcbride bunion repair with [facility] osteotomy, he also injected scar on her right foot with kenalog on 11/02/14

## 2014-11-09 ENCOUNTER — Other Ambulatory Visit: Payer: Self-pay | Admitting: *Deleted

## 2014-11-09 NOTE — Telephone Encounter (Signed)
Ibuprofen just refilled on 12.22.15

## 2014-11-10 ENCOUNTER — Encounter: Payer: Self-pay | Admitting: Family

## 2014-11-10 ENCOUNTER — Telehealth: Payer: Self-pay | Admitting: *Deleted

## 2014-11-10 ENCOUNTER — Ambulatory Visit (INDEPENDENT_AMBULATORY_CARE_PROVIDER_SITE_OTHER): Payer: Medicare Other | Admitting: Podiatry

## 2014-11-10 ENCOUNTER — Ambulatory Visit (INDEPENDENT_AMBULATORY_CARE_PROVIDER_SITE_OTHER): Payer: Medicare Other

## 2014-11-10 ENCOUNTER — Ambulatory Visit (INDEPENDENT_AMBULATORY_CARE_PROVIDER_SITE_OTHER): Payer: Medicare Other | Admitting: Family

## 2014-11-10 VITALS — BP 125/93 | HR 93 | Temp 98.9°F | Resp 16

## 2014-11-10 VITALS — BP 130/80 | HR 88 | Temp 98.5°F

## 2014-11-10 DIAGNOSIS — Z9889 Other specified postprocedural states: Secondary | ICD-10-CM

## 2014-11-10 DIAGNOSIS — M797 Fibromyalgia: Secondary | ICD-10-CM | POA: Diagnosis not present

## 2014-11-10 DIAGNOSIS — G40909 Epilepsy, unspecified, not intractable, without status epilepticus: Secondary | ICD-10-CM | POA: Diagnosis not present

## 2014-11-10 DIAGNOSIS — M21612 Bunion of left foot: Secondary | ICD-10-CM

## 2014-11-10 DIAGNOSIS — F3162 Bipolar disorder, current episode mixed, moderate: Secondary | ICD-10-CM | POA: Diagnosis not present

## 2014-11-10 DIAGNOSIS — M2012 Hallux valgus (acquired), left foot: Secondary | ICD-10-CM | POA: Diagnosis not present

## 2014-11-10 DIAGNOSIS — R590 Localized enlarged lymph nodes: Secondary | ICD-10-CM | POA: Diagnosis not present

## 2014-11-10 MED ORDER — OXYCODONE-ACETAMINOPHEN 10-325 MG PO TABS: ORAL_TABLET | ORAL | Status: DC

## 2014-11-10 NOTE — Telephone Encounter (Signed)
Pt states she is hurting and want a Percocet rx.  Dr. Milinda Pointer ordered as previously prescribed.  Pt to pick up in [place] office.

## 2014-11-10 NOTE — Patient Instructions (Signed)

## 2014-11-11 ENCOUNTER — Telehealth: Payer: Self-pay | Admitting: Family

## 2014-11-11 ENCOUNTER — Other Ambulatory Visit: Payer: Self-pay | Admitting: Family

## 2014-11-11 ENCOUNTER — Other Ambulatory Visit: Payer: Self-pay | Admitting: Podiatry

## 2014-11-11 MED ORDER — ECONAZOLE NITRATE 1 % EX CREA: TOPICAL_CREAM | Freq: Every day | CUTANEOUS | Status: DC

## 2014-11-11 MED ORDER — METOCLOPRAMIDE HCL 10 MG PO TABS: 10.0000 mg | ORAL_TABLET | Freq: Four times a day (QID) | ORAL | Status: DC | PRN

## 2014-11-11 MED ORDER — ESCITALOPRAM OXALATE 10 MG PO TABS: 10.0000 mg | ORAL_TABLET | Freq: Every day | ORAL | Status: DC

## 2014-11-11 MED ORDER — ZOLPIDEM TARTRATE 10 MG PO TABS: 10.0000 mg | ORAL_TABLET | Freq: Every day | ORAL | Status: DC

## 2014-11-11 MED ORDER — MIRTAZAPINE 30 MG PO TABS: 30.0000 mg | ORAL_TABLET | Freq: Every day | ORAL | Status: DC

## 2014-11-11 MED ORDER — AMMONIUM LACTATE 12 % EX LOTN: 1.0000 "application " | TOPICAL_LOTION | CUTANEOUS | Status: DC | PRN

## 2014-11-11 MED ORDER — CYCLOBENZAPRINE HCL 10 MG PO TABS: ORAL_TABLET | ORAL | Status: DC

## 2014-11-11 NOTE — Telephone Encounter (Signed)
Pt is requesting refill of cyclobenzaprine (FLEXERIL) 10 MG tablet escitalopram (LEXAPRO) 10 MG tablet ibuprofen (ADVIL,MOTRIN) 600 MG tablet metoCLOPramide (REGLAN) 10 MG tablet mirtazapine (REMERON) 30 MG tablet naproxen (NAPRELAN) 500 MG 24 hr tablet ondansetron (ZOFRAN) 8 MG tablet polyethylene glycol (MIRALAX / GLYCOLAX)  rizatriptan (MAXALT-MLT) 10 MG disintegrating tablet zolpidem (AMBIEN) Pt wanting everything she takes bc she states price will go up first of year. Pt also mentioned assistant  was to mention to padonda about getting something for anxiety.  Pt also wanted hydrocodone.  (Previous message in epic 12/29 to [facility] requesting percocet for her foot pain)  [facility]  /[address]

## 2014-11-11 NOTE — Telephone Encounter (Signed)
Pt needs OV to discuss anxiety. Abby Potash has not seen pt for anxiety

## 2014-11-11 NOTE — Progress Notes (Signed)
She presents today for follow-up of a McBride bunion repair and Akin osteotomy of her left foot. He denies fever chills nausea vomiting muscle ex pain status when felt much better than previous one.  Objective: Vital signs are stable she is alert and oriented 3. There is no erythema cellulitis drainage or odor after the dry sterile dressing was removed. Radiographic evaluation demonstrates a well-positioned McBride and Akin osteotomy with internal fixation maintaining correction.  Assessment: Well-healing surgical foot status post 1 week left.  Plan: Redressed today dressing compressive dressing encouraged her to not use the crutches but ambulate with the boot and I will follow-up with her in 1 week

## 2014-11-11 NOTE — Progress Notes (Signed)
Subjective:    Patient ID: Molly [name], female    DOB: 12-11-1975, 38 y.o.   MRN: [id number]  HPI 38 year old African-American female, with history of bipolar disorder, carpal tunnel syndrome, GERD, osteoarthritis, fibromyalgia, migraine headaches, and seizure disorder status post left bunionectomy is in today for recheck. Reports having no swelling underneath her right arm present 2 weeks that is painless. Denies any drainage or discharge from her breasts. Doesn't routinely perform self breast exams. She has previously been under the care of neurology for management of seizure disorder but is not happy with her current service. Does not take the medication daily because she feels it does not work. Has refused surgery. His requesting a second opinion through with our neurology. Reports being on prophylactic antibiotic therapy for left bunionectomy. Has a family history of seizure disorder in her sister, maternal grandmother, and mother.   Review of Systems  Constitutional: Negative.   HENT: Negative.   Respiratory: Negative.   Cardiovascular: Negative.   Gastrointestinal: Negative.   Endocrine: Negative.   Genitourinary: Negative.   Musculoskeletal: Positive for myalgias and arthralgias.  Skin: Negative.   Allergic/Immunologic: Negative.   Neurological: Negative.   Psychiatric/Behavioral: Negative.    Past Medical History  Diagnosis Date  . Migraine   . GERD (gastroesophageal reflux disease)   . Lumbago   . Seizures     last seizure nov 2014  . Allergic rhinitis   . Fibromyalgia   . Carpal tunnel syndrome on both sides   . High cholesterol     History   Social History  . Marital Status: Legally Separated    Spouse Name: N/A    Number of Children: 2  . Years of Education: 12+   Occupational History  . unemployed    Social History Main Topics  . Smoking status: Never Smoker   . Smokeless tobacco: Never Used  . Alcohol Use: No  . Drug Use: No  . Sexual  Activity: Not on file   Other Topics Concern  . Not on file   Social History Narrative   Patient lives at home.    Patient has 2 children.    Patient has a college education.    Patient is right handed.     Past Surgical History  Procedure Laterality Date  . Abdominal hysterectomy    . Tubal ligation    . Foot surgery      bilateral, toe surgery  . Bunionectomy Right 11-07-2013  . Aiken osteotomy Right 11-07-2013    Family History  Problem Relation Age of Onset  . Seizures Mother   . Emphysema Father   . Seizures Maternal Grandmother   . Seizures Sister   . Bipolar disorder Sister     Allergies  Allergen Reactions  . Fish Allergy     Throat closes, and hives.   Marland Kitchen Phenergan [Promethazine Hcl] Swelling  . Toradol [Ketorolac Tromethamine] Hives    Current Outpatient Prescriptions on File Prior to Visit  Medication Sig Dispense Refill  . ammonium lactate (LAC-HYDRIN) 12 % lotion Apply 1 application topically as needed for dry skin.    Marland Kitchen aspirin 325 MG tablet Take 325 mg by mouth daily.    Marland Kitchen azithromycin (ZITHROMAX) 250 MG tablet Take 250 mg by mouth daily. Unknown start date    . cephALEXin (KEFLEX) 500 MG capsule Take 1 capsule (500 mg total) by mouth 3 (three) times daily. 30 capsule 0  . cetirizine (ZYRTEC) 10 MG tablet Take 10 mg by mouth  daily.    . cyclobenzaprine (FLEXERIL) 10 MG tablet TAKE 1 TABLET BY MOUTH 3 TIMES A DAY AS NEEDED FOR SPASMS 30 tablet 0  . econazole nitrate 1 % cream Apply topically daily. 85 g 5  . escitalopram (LEXAPRO) 10 MG tablet TAKE 1 TABLET EVERY DAY 30 tablet 3  . esomeprazole (NEXIUM) 40 MG capsule Take 40 mg by mouth daily at 12 noon.    . fluticasone (VERAMYST) 27.5 MCG/SPRAY nasal spray Place 2 sprays into the nose daily.    Marland Kitchen HYDROcodone-acetaminophen (NORCO/VICODIN) 5-325 MG per tablet Take 2 tablets by mouth every 4 (four) hours as needed for moderate pain.    Marland Kitchen ibuprofen (ADVIL,MOTRIN) 800 MG tablet Take 1 tablet (800 mg  total) by mouth every 8 (eight) hours as needed. 30 tablet 3  . lacosamide (VIMPAT) 50 MG TABS tablet Take 1 tablet by mouth twice a day for 1 week then increase to 2 tablets by mouth twice a day. 120 tablet 3  . Linaclotide (LINZESS) 145 MCG CAPS capsule Take 1 capsule (145 mcg total) by mouth daily. 30 capsule 6  . metoCLOPramide (REGLAN) 10 MG tablet Take 10 mg by mouth every 6 (six) hours as needed for nausea.    . mirtazapine (REMERON) 30 MG tablet Take 30 mg by mouth at bedtime.    Marland Kitchen neomycin-polymyxin-hydrocortisone (CORTISPORIN) otic solution 1-2 drops to the toe after soaking twice daily 10 mL 1  . olopatadine (PATANOL) 0.1 % ophthalmic solution Place 1 drop into both eyes 2 (two) times daily.    . ondansetron (ZOFRAN) 8 MG tablet Take 8 mg by mouth 3 (three) times daily as needed for nausea or vomiting.    . polyethylene glycol (MIRALAX / GLYCOLAX) packet MIX CONTENTS OF 1 PACKET WITH 4 TO 8 OUNCES OF LIQUID AND DRINK EVERY DAY 30 packet 0  . rizatriptan (MAXALT-MLT) 10 MG disintegrating tablet Take 10 mg by mouth 3 (three) times daily as needed for migraine. May repeat in 2 hours if needed    . topiramate (TOPAMAX) 100 MG tablet Take 100 mg by mouth 2 (two) times daily.    Marland Kitchen topiramate (TOPAMAX) 100 MG tablet TAKE 1 TABLET BY MOUTH TWICE DAILY 180 tablet 1  . zolpidem (AMBIEN) 10 MG tablet Take 10 mg by mouth at bedtime.     No current facility-administered medications on file prior to visit.    BP 130/80 mmHg  Pulse 88  Temp(Src) 98.5 F (36.9 C) (Oral)chart    Objective:   Physical Exam  Constitutional: She is oriented to person, place, and time. She appears well-developed and well-nourished.  HENT:  Right Ear: External ear normal.  Left Ear: External ear normal.  Nose: Nose normal.  Mouth/Throat: Oropharynx is clear and moist.  Neck: Normal range of motion. Neck supple.  Cardiovascular: Normal rate, regular rhythm and normal heart sounds.   Pulmonary/Chest: Effort normal  and breath sounds normal.  Abdominal: Soft. Bowel sounds are normal.  Musculoskeletal: Normal range of motion.  Left fracture boot.  Lymphadenopathy:    She has axillary adenopathy.       Right axillary: Lateral adenopathy present.       Left axillary: No pectoral and no lateral adenopathy present. Neurological: She is alert and oriented to person, place, and time.  Skin: Skin is dry.  Psychiatric: She has a normal mood and affect.          Assessment & Plan:  Cledith was seen today for follow-up.  Diagnoses and  associated orders for this visit:  Bipolar 1 disorder, mixed, moderate  Fibromyalgia  Seizure disorder - Ambulatory referral to Neurology  Axillary lymphadenopathy    Refer to neurology as requested. Ultrasound to evaluate the lymphadenopathy underneath her right axilla. Labs obtained today will notify patient pending results. Strongly encourage medication compliance to prevent seizure convulsions.

## 2014-11-11 NOTE — Telephone Encounter (Signed)
Maxalt is managed by Neurology.  Miralax is managed by GI.  Zofran and hydrocodone are denied, per Provider  Other refills sent

## 2014-11-17 ENCOUNTER — Ambulatory Visit (INDEPENDENT_AMBULATORY_CARE_PROVIDER_SITE_OTHER): Payer: Medicare Other

## 2014-11-17 ENCOUNTER — Encounter: Payer: Medicare Other | Admitting: Podiatry

## 2014-11-17 ENCOUNTER — Ambulatory Visit (INDEPENDENT_AMBULATORY_CARE_PROVIDER_SITE_OTHER): Payer: Medicare Other | Admitting: Podiatry

## 2014-11-17 DIAGNOSIS — Z9889 Other specified postprocedural states: Secondary | ICD-10-CM

## 2014-11-17 DIAGNOSIS — M2012 Hallux valgus (acquired), left foot: Secondary | ICD-10-CM

## 2014-11-17 NOTE — Progress Notes (Signed)
Subjective:     Patient ID: Molly [name], female   DOB: Nov 16, 1975, 39 y.o.   MRN: [id number]  HPI patient presents for suture removal stating she is feeling well   Review of Systems     Objective:   Physical Exam Neurovascular status intact with wound edges coapted well and patient admitting that she did not wear her boot for periods of time and did walk on the operated foot. The right hallux is in rectus position and the first MPJ is mildly swollen but in good position    Assessment:     Doing well post McBride bunionectomy and Akin osteotomy with patient walking on this area more than she should have    Plan:     Advised on the importance of him mobilization and stitches removed today with wound edges coapted well. Reviewed her x-rays and that everything looks good but it is very important she stays immobilized and reappoint to Dr. Milinda Pointer in 2 weeks

## 2014-11-18 ENCOUNTER — Telehealth: Payer: Self-pay | Admitting: *Deleted

## 2014-11-18 NOTE — Telephone Encounter (Signed)
Pt states she had her sutures removed yesterday and now the area looks like it has opened.  Pt states she has shower and put on Aquaphor.  I told the pt to come in tomorrow morning around 800am, tonight to cover the area with a gauze pad and wrap with ace.  Pt agreed.

## 2014-11-19 ENCOUNTER — Ambulatory Visit (INDEPENDENT_AMBULATORY_CARE_PROVIDER_SITE_OTHER): Payer: Medicare Other | Admitting: Podiatry

## 2014-11-19 VITALS — BP 143/87 | HR 90 | Resp 17

## 2014-11-19 DIAGNOSIS — Z9889 Other specified postprocedural states: Secondary | ICD-10-CM

## 2014-11-19 MED ORDER — CEPHALEXIN 500 MG PO CAPS: 500.0000 mg | ORAL_CAPSULE | Freq: Three times a day (TID) | ORAL | Status: DC

## 2014-11-19 MED ORDER — IBUPROFEN 800 MG PO TABS: 800.0000 mg | ORAL_TABLET | Freq: Three times a day (TID) | ORAL | Status: DC | PRN

## 2014-11-19 NOTE — Telephone Encounter (Signed)
Lm on vm to cb °

## 2014-11-19 NOTE — Progress Notes (Signed)
Subjective:     Patient ID: Molly [name], female   DOB: 11-22-1975, 39 y.o.   MRN: [id number]  HPI patient states that she was concerned because she started to have a slight increase in pain and she felt that maybe there was a little drainage from her incision site. Patient had been noncompliant and been walking on her foot without immobilization   Review of Systems     Objective:   Physical Exam Neurovascular status intact negative Homans sign noted with well-healing surgical site first metatarsal and hallux. The wound edges actually are coapted well with a very slight distal gap but is localized with no drainage noted no erythema edema or no proximal edema erythema or drainage noted. Temperature was 99.1 today    Assessment:     Probable irritation of incision site that's localized with no indications of systemic infection    Plan:     At this time I applied a sterile dressing with Silvadene across the incision site that she will keep on for the next week and I placed her on cephalexin 500 mg 3 times a day and she will see Dr. Milinda Pointer next week. She was given strict instructions to check her temperature every day and if there should be any increase in temperature or if she should have any systemic signs of infection she is to let us know immediately and go to the emergency room.

## 2014-11-22 ENCOUNTER — Other Ambulatory Visit: Payer: Self-pay | Admitting: Neurology

## 2014-11-22 ENCOUNTER — Other Ambulatory Visit: Payer: Self-pay | Admitting: Family

## 2014-11-25 ENCOUNTER — Telehealth: Payer: Self-pay

## 2014-11-25 ENCOUNTER — Telehealth: Payer: Self-pay | Admitting: *Deleted

## 2014-11-25 ENCOUNTER — Other Ambulatory Visit: Payer: Self-pay | Admitting: Family

## 2014-11-25 ENCOUNTER — Emergency Department (HOSPITAL_COMMUNITY)
Admission: EM | Admit: 2014-11-25 | Discharge: 2014-11-25 | Disposition: A | Discharge status: Home / Self Care | Payer: Medicare Other | Attending: Emergency Medicine | Admitting: Emergency Medicine

## 2014-11-25 DIAGNOSIS — M79672 Pain in left foot: Secondary | ICD-10-CM | POA: Diagnosis not present

## 2014-11-25 DIAGNOSIS — T819XXA Unspecified complication of procedure, initial encounter: Secondary | ICD-10-CM | POA: Diagnosis not present

## 2014-11-25 DIAGNOSIS — G8918 Other acute postprocedural pain: Secondary | ICD-10-CM | POA: Diagnosis not present

## 2014-11-25 DIAGNOSIS — Z8679 Personal history of other diseases of the circulatory system: Secondary | ICD-10-CM | POA: Diagnosis not present

## 2014-11-25 DIAGNOSIS — M79675 Pain in left toe(s): Secondary | ICD-10-CM | POA: Insufficient documentation

## 2014-11-25 MED ORDER — SULFAMETHOXAZOLE-TRIMETHOPRIM 800-160 MG PO TABS: 1.0000 | ORAL_TABLET | Freq: Two times a day (BID) | ORAL | Status: DC

## 2014-11-25 NOTE — Telephone Encounter (Signed)
Pt called stating that she went to the ER and they debrided her surgical wound on her foot and gave her a broader spectrum antibiotic (Bactrim), she asked if she should be worked in today or if she would be ok to keep her appt for tomorrow. I returned her call and left a message for her to continue taking antibiotic therapy and keep her appt with Dr Milinda Pointer tomorrow, seek medical attn if condition worsens

## 2014-11-25 NOTE — ED Notes (Signed)
See Downtime Charting.

## 2014-11-25 NOTE — Telephone Encounter (Signed)
"  I went to the emergency room last night because I was in extreme pain.  They took off the dressing and said my toe was infected.  Green stuff was coming out of it.  They gave me a Bactrim, I have already been taking Cephalexin.  I have an appointment with Dr. Milinda Pointer tomorrow.  What does he want me to do in the meantime?"  Did they give you a prescription for Bactrim?  "No, they just gave me the one."  Are you still taking pain medication?  "Yes I take 2 every 4 hours."  I will have to speak to Dr. Milinda Pointer and give you a call back.

## 2014-11-25 NOTE — Telephone Encounter (Signed)
Call in batrim DS ten day, one tablet twice daily. One refill.  Get two in today.

## 2014-11-25 NOTE — Telephone Encounter (Signed)
I called and informed patient that Dr. Milinda Pointer said he's going to send in a prescription for Bactrim to your pharmacy.  He said to make sure you get two in today.  I'm sending it to [facility] on [address], thank you."

## 2014-11-26 ENCOUNTER — Ambulatory Visit (INDEPENDENT_AMBULATORY_CARE_PROVIDER_SITE_OTHER): Payer: Medicare Other

## 2014-11-26 ENCOUNTER — Ambulatory Visit (INDEPENDENT_AMBULATORY_CARE_PROVIDER_SITE_OTHER): Payer: Medicare Other | Admitting: Podiatry

## 2014-11-26 ENCOUNTER — Encounter: Payer: Self-pay | Admitting: Podiatry

## 2014-11-26 DIAGNOSIS — M2012 Hallux valgus (acquired), left foot: Secondary | ICD-10-CM | POA: Diagnosis not present

## 2014-11-26 DIAGNOSIS — M21612 Bunion of left foot: Secondary | ICD-10-CM

## 2014-11-26 DIAGNOSIS — Z9889 Other specified postprocedural states: Secondary | ICD-10-CM

## 2014-11-26 MED ORDER — OXYCODONE-ACETAMINOPHEN 10-325 MG PO TABS: 1.0000 | ORAL_TABLET | Freq: Four times a day (QID) | ORAL | Status: DC | PRN

## 2014-11-27 NOTE — Progress Notes (Signed)
She presents today status post McBride Akin osteotomy left foot. She states that seems to be doing better. She denies fever chills nausea vomiting muscle aches and pains. States that the left foot still is sore. She continues to take her Bactrim on a regular basis.  Objective: Vital signs are stable she is alert and oriented 3. Dry sterile dressing was removed demonstrates mild edema no erythema no cellulitis drainage or odor mild dehiscence to the distalmost aspect of the wound. Radiographically the osteotomy appears to be sound and in good position despite her best efforts up walking without the Cam Walker.  Assessment: Status post McBride bunion repair and Akin osteotomy complicated by a postop infection and noncompliance.  Plan: I redressed the foot today with a dry sterile compressive dressing and antibiotic ointment. She will continue to take her Bactrim until completely gone. She will keep this foot dry and clean and continue to wear her Cam Walker. I will follow-up with her in 1 week. I re-dispensed a prescription for Percocet.

## 2014-12-03 ENCOUNTER — Ambulatory Visit (INDEPENDENT_AMBULATORY_CARE_PROVIDER_SITE_OTHER): Payer: Medicare Other | Admitting: Podiatry

## 2014-12-03 ENCOUNTER — Encounter: Payer: Self-pay | Admitting: Podiatry

## 2014-12-03 VITALS — BP 118/76 | HR 92 | Resp 12

## 2014-12-03 DIAGNOSIS — Z9889 Other specified postprocedural states: Secondary | ICD-10-CM

## 2014-12-03 NOTE — Progress Notes (Signed)
She presents today for follow-up of her Molly [name] osteotomy left foot. She states it is still painful but it seems to be doing better.  Objective: Vital signs are stable she is alert and oriented 3 dressing was removed demonstrates no erythema cellulitis drainage or odor margins appear to be well coapted. I see no signs appear months.  Assessment: Well-healed surgical foot left.  Plan: Encouraged soaking Epsom salts and water daily redressed with a light dressing and utilize her compression bandage in her Darco shoe. Follow up with her in 1-2 weeks

## 2014-12-10 DIAGNOSIS — L708 Other acne: Secondary | ICD-10-CM | POA: Diagnosis not present

## 2014-12-10 DIAGNOSIS — D2371 Other benign neoplasm of skin of right lower limb, including hip: Secondary | ICD-10-CM | POA: Diagnosis not present

## 2014-12-10 DIAGNOSIS — L910 Hypertrophic scar: Secondary | ICD-10-CM | POA: Diagnosis not present

## 2014-12-15 ENCOUNTER — Ambulatory Visit: Payer: Medicare Other | Admitting: Adult Health

## 2014-12-17 ENCOUNTER — Encounter: Payer: Self-pay | Admitting: Neurology

## 2014-12-17 ENCOUNTER — Ambulatory Visit (INDEPENDENT_AMBULATORY_CARE_PROVIDER_SITE_OTHER): Payer: Medicare Other | Admitting: Podiatry

## 2014-12-17 ENCOUNTER — Ambulatory Visit (INDEPENDENT_AMBULATORY_CARE_PROVIDER_SITE_OTHER): Payer: Medicare Other | Admitting: Neurology

## 2014-12-17 ENCOUNTER — Ambulatory Visit (INDEPENDENT_AMBULATORY_CARE_PROVIDER_SITE_OTHER): Payer: Medicare Other

## 2014-12-17 VITALS — BP 118/90 | HR 87 | Resp 16 | Ht 69.0 in | Wt 169.0 lb

## 2014-12-17 DIAGNOSIS — M21612 Bunion of left foot: Secondary | ICD-10-CM

## 2014-12-17 DIAGNOSIS — R569 Unspecified convulsions: Secondary | ICD-10-CM

## 2014-12-17 DIAGNOSIS — M2012 Hallux valgus (acquired), left foot: Secondary | ICD-10-CM

## 2014-12-17 DIAGNOSIS — Z9889 Other specified postprocedural states: Secondary | ICD-10-CM

## 2014-12-17 MED ORDER — OXYCODONE-ACETAMINOPHEN 10-325 MG PO TABS: ORAL_TABLET | ORAL | Status: DC

## 2014-12-17 MED ORDER — LACOSAMIDE 100 MG PO TABS: ORAL_TABLET | ORAL | Status: DC
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
## 2014-12-17 NOTE — Progress Notes (Signed)
She presents today for follow-up of her Molly [name] osteotomy left foot. He states that she still walks in the lateral aspect of her foot she started to have some arch pain. She still has tenderness and swelling around the first metatarsophalangeal joint.  Objective: Vital signs are stable she is alert and oriented 3. There is no erythema but mild edema no cellulitis drainage or odor. Incision is gone on to heal uneventfully margins are well coapted though that is somewhat discolored. Radiographic evaluation confirms an incomplete fusion of the Akin osteotomy at this point. It should go on to heal uneventfully and appears to be healing from lateral to medial.  Assessment: Slowly healing McBride Akin osteotomy left foot.  Plan: Encouraged range of motion exercises as well as contrast baths. I will follow-up with her in 1 month. She is to continue to wear her Darco shoe gear and that time however I did encourage her to try her orthotic in the Darco shoe to help give her arch some support.
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
## 2014-12-21 ENCOUNTER — Encounter: Payer: Self-pay | Admitting: Neurology

## 2014-12-23 ENCOUNTER — Telehealth: Payer: Self-pay | Admitting: Family

## 2014-12-23 NOTE — Telephone Encounter (Signed)
Caryl Pina from [facility] states they are unable to accept patient due to her insurance.  The referral was sent 11/2013.

## 2014-12-23 NOTE — Telephone Encounter (Signed)
Thank you :)

## 2014-12-24 ENCOUNTER — Ambulatory Visit (INDEPENDENT_AMBULATORY_CARE_PROVIDER_SITE_OTHER): Payer: Medicare Other | Admitting: Podiatry

## 2014-12-24 ENCOUNTER — Encounter: Payer: Self-pay | Admitting: Podiatry

## 2014-12-24 VITALS — BP 137/90 | HR 91 | Resp 16

## 2014-12-24 DIAGNOSIS — Z9889 Other specified postprocedural states: Secondary | ICD-10-CM

## 2014-12-26 NOTE — Progress Notes (Signed)
She presents today for follow-up of her Darlin Coco bunion repair. She states that it feels like her toe is bending back to the left like it was.  Objective: Vital signs are stable she is alert and oriented 3. Upon evaluation of his left foot does demonstrate that the hallux left is nice and rectus however she has a little medial deviation of toes #234 and 5 of the left foot. This is most likely resulting in her current feeling of regression.  Assessment: Well-healing surgical foot left. Medial deviation of the lesser toes.  Plan: Dispensed a Darco digital splint. Follow up with her at her next regularly scheduled appointment

## 2015-01-01 ENCOUNTER — Telehealth: Payer: Self-pay | Admitting: *Deleted

## 2015-01-01 ENCOUNTER — Ambulatory Visit (INDEPENDENT_AMBULATORY_CARE_PROVIDER_SITE_OTHER): Payer: Medicare Other

## 2015-01-01 ENCOUNTER — Ambulatory Visit (INDEPENDENT_AMBULATORY_CARE_PROVIDER_SITE_OTHER): Payer: Medicare Other | Admitting: Podiatrist

## 2015-01-01 DIAGNOSIS — S93602A Unspecified sprain of left foot, initial encounter: Secondary | ICD-10-CM | POA: Diagnosis not present

## 2015-01-01 MED ORDER — OXYCODONE-ACETAMINOPHEN 10-325 MG PO TABS: ORAL_TABLET | ORAL | Status: DC

## 2015-01-01 NOTE — Telephone Encounter (Addendum)
Pt request pain medication.  I informed pt she will need to come to the [place] office to pick up the Percocet rx.  Pt states she fell 2 days ago after partially putting her boot on to go to the bathroom, now has sharp pain in her surgery foot.  Dr. Valentina Lucks states bring her in to be evaluated.  Pt is told to come in now, and agrees.

## 2015-01-01 NOTE — Telephone Encounter (Signed)
Sounds good and to f/u with me at next reg scheduled appointment unless more urgent.

## 2015-01-01 NOTE — Progress Notes (Signed)
Chief Complaint  Patient presents with  . Foot Pain    fell two days ago and the foot has been hurting since, left foot      HPI: Patient is 39 y.o. female who presents today for pain left foot especially the left hallux where she previously had a Akin osteotomy performed. She states that she was trying to quickly step into her postop shoe and the shoe twisted out from under her and she bit her toe backwards. She states it's been swelling and painful ever since.   Allergies  Allergen Reactions  . Fish Allergy     Throat closes, and hives.   Marland Kitchen Phenergan [Promethazine Hcl] Swelling  . Toradol [Ketorolac Tromethamine] Hives    Physical Exam  Neurovascular status is intact. Mild swelling of the left hallux is noted consistent with healing Akin osteotomy. Subjective complaints of pain with range of motion are noted. No sign of infection is present. No redness, no swelling, no drainage, no malodor is noted. X-rays show healing Akin osteotomy. No sign of fracture seen.  Assessment: sprain of left foot/  Fall status post surgery on the left foot  Plan: Applied an Unna boot to the left foot and instructed her to wear this for the next 2-7 days. Also dispensed prescription for pain medication. She will be seen back for a postoperative follow-up with Dr. Milinda Pointer

## 2015-01-01 NOTE — Telephone Encounter (Signed)
You can do Percocet 5/325 1 tab PO q6h prn pain. Disp #30. Thanks.

## 2015-01-14 ENCOUNTER — Encounter: Payer: Self-pay | Admitting: Podiatry

## 2015-01-14 ENCOUNTER — Ambulatory Visit (INDEPENDENT_AMBULATORY_CARE_PROVIDER_SITE_OTHER): Payer: Medicare Other | Admitting: Podiatry

## 2015-01-14 VITALS — BP 140/92 | HR 96 | Resp 12

## 2015-01-14 DIAGNOSIS — Z9889 Other specified postprocedural states: Secondary | ICD-10-CM

## 2015-01-14 DIAGNOSIS — S93602A Unspecified sprain of left foot, initial encounter: Secondary | ICD-10-CM

## 2015-01-14 NOTE — Progress Notes (Signed)
She presents today for follow-up of a sprain of her left foot. She states this still moderately tender but no further follow-up.  Objective: Vital signs are stable she's alert and oriented 3. There is no erythema or edema cellulitis drainage or odor. Minimal tenderness on palpation of the mid plantar foot and mid dorsal foot.  Assessment: Sprain left surgical foot.  Plan: On follow-up with her in 3-4 weeks and she will try to get back into her regular shoe.

## 2015-01-15 ENCOUNTER — Telehealth: Payer: Self-pay | Admitting: *Deleted

## 2015-01-15 ENCOUNTER — Telehealth: Payer: Self-pay | Admitting: Family

## 2015-01-15 DIAGNOSIS — R590 Localized enlarged lymph nodes: Secondary | ICD-10-CM

## 2015-01-15 NOTE — Telephone Encounter (Signed)
Pt states she still can't get comfortably into her regular shoes.  Pt states she is wearing a shoe she wore after the surgery on her last surgery foot.  I told her each foot reacts differently after surgery and may have swelling on and off for 6 - 9 months post op, so she may need to get a wider and longer shoe for this foot.  Pt agrees.

## 2015-01-15 NOTE — Telephone Encounter (Signed)
Pt states she discussed w/ padonda at 12/29  and pt states she was to be scheduled for an ultrasound for the knots under her arm. Pt states they are getting larger and sore. pls advise.

## 2015-01-18 NOTE — Telephone Encounter (Signed)
Order placed

## 2015-01-22 ENCOUNTER — Other Ambulatory Visit: Payer: Self-pay

## 2015-01-22 DIAGNOSIS — R590 Localized enlarged lymph nodes: Secondary | ICD-10-CM

## 2015-01-22 NOTE — Telephone Encounter (Signed)
Called and spoke with [facility] and the tech informed that since pt had knots in the axillary region pt's orders need to be changed to Bilateral diagnostic [id number] AND [id number]. New orders added. Per [facility]oma L[facility] pt can call the breast center directly and answer the pre-screening questions and schedule her appt.  Called and spoke with pt and pt was given the information for the breast center of [place]. Pt states she will call and schedule her appt.

## 2015-01-25 ENCOUNTER — Telehealth: Payer: Self-pay | Admitting: Family Medicine

## 2015-01-25 NOTE — Telephone Encounter (Signed)
Call from pharmacist at [facility]. I submitted a quantity exception prior authorization request for patient last week for Vimpat 100 mg 2 tablets twice daily. Plan will approve Vimpat 200 mg tablets twice daily, which is within their quantity allowance for a 30 day period. Ok'd to switch to Vimpat 200 mg bid. The quantity exception request that was currently in place has been cancelled out.

## 2015-01-26 ENCOUNTER — Ambulatory Visit (INDEPENDENT_AMBULATORY_CARE_PROVIDER_SITE_OTHER): Payer: Medicare Other | Admitting: Podiatry

## 2015-01-26 ENCOUNTER — Encounter: Payer: Self-pay | Admitting: Podiatry

## 2015-01-26 ENCOUNTER — Other Ambulatory Visit: Payer: Self-pay | Admitting: Family

## 2015-01-26 VITALS — BP 120/84 | HR 99 | Resp 16

## 2015-01-26 DIAGNOSIS — Z9889 Other specified postprocedural states: Secondary | ICD-10-CM

## 2015-01-26 DIAGNOSIS — S93402D Sprain of unspecified ligament of left ankle, subsequent encounter: Secondary | ICD-10-CM

## 2015-01-26 MED ORDER — OXYCODONE-ACETAMINOPHEN 10-325 MG PO TABS: 1.0000 | ORAL_TABLET | Freq: Four times a day (QID) | ORAL | Status: DC | PRN

## 2015-01-26 NOTE — Progress Notes (Signed)
She presents today for follow-up of her bilateral first metatarsophalangeal joint surgery. At this point she still complaining of pain about first metatarsophalangeal joint particularly on the left side. She states that it takes her breath away. She also has radiating pains from the joint of her leg.  Objective: Vital signs are stable she is alert and oriented 3 she still has tenderness on the anterolateral ankle from previous ankle sprain. She also has tenderness on range of motion first metatarsophalangeal joint left foot. Minimal edema and no erythema saline as drainage or odor.  Assessment: Slowly healing surgical foot with limited range of motion which is painful. Slowly healing ankle sprain left.  Plan: Continue all conservative therapies and we will start physical therapy at [place].

## 2015-01-28 DIAGNOSIS — M79671 Pain in right foot: Secondary | ICD-10-CM | POA: Diagnosis not present

## 2015-01-28 DIAGNOSIS — M79672 Pain in left foot: Secondary | ICD-10-CM | POA: Diagnosis not present

## 2015-01-29 ENCOUNTER — Ambulatory Visit (INDEPENDENT_AMBULATORY_CARE_PROVIDER_SITE_OTHER): Payer: Medicare Other | Admitting: Family Medicine

## 2015-01-29 VITALS — BP 114/72 | HR 86 | Temp 98.1°F | Wt 170.0 lb

## 2015-01-29 DIAGNOSIS — R109 Unspecified abdominal pain: Secondary | ICD-10-CM | POA: Diagnosis not present

## 2015-01-29 DIAGNOSIS — K5901 Slow transit constipation: Secondary | ICD-10-CM | POA: Diagnosis not present

## 2015-01-30 ENCOUNTER — Encounter: Payer: Self-pay | Admitting: Family Medicine

## 2015-01-30 NOTE — Progress Notes (Signed)
S: Left sided lower and upper abdominal pain radiating around to her back but not CVA. About 1 week. She denies dysuria. Nocturia 3-4x a night going on 3-4 weeks. No polyuria. Has occurred in the past with similar pain when she is constipated. She has a slow transit system and takes linzess intermittently. Usually goes about twice a month but has been 3weeks without a BM and has not taken linzess yet. Sees [facility]. Pain rated as moderate in intensity. Not worsening. Aching or sharp pain.   ROS- no polyuria. No nausea, vomiting. No nausea, vomiting. Occasional cold sweats when pain is bad. No fecal or urinary incontinence. Some L knee pain.   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 Mild to moderate pain throughout left side without rebound. Some voluntary guarding.  Negative straight leg raise. No CVA tenderness  UA normal- Keba to enter at later date  A/P:  Left sided abdominal pain Similar bouts with  Constipation in past and has not had BM in 3 weeks. Patient wanted to make sure not UTI and does not appear consistent with that. She will try her linzess and if no relief will either return to care here or at GI office. She was given strict return precautions     Please note, Epic was unavailable at time of visit due to internet outage. I would normally use problem oriented charting and update problem list in more in depth manner but this was limited by epic only responding to blank text typing today and being minimally responsive to button clicks. A limited note has been produced as a result.

## 2015-02-01 LAB — POCT URINALYSIS DIPSTICK
Bilirubin, UA: NEGATIVE
GLUCOSE UA: NEGATIVE
Ketones, UA: NEGATIVE
LEUKOCYTES UA: NEGATIVE
Nitrite, UA: NEGATIVE
PH UA: 7
Protein, UA: NEGATIVE
RBC UA: NEGATIVE
Spec Grav, UA: 1.01
Urobilinogen, UA: 0.2

## 2015-02-01 NOTE — Addendum Note (Signed)
Addended by: Clyde Lundborg A on: 02/01/2015 08:27 AM   Modules accepted: Orders

## 2015-02-08 ENCOUNTER — Other Ambulatory Visit: Payer: Self-pay | Admitting: Family

## 2015-02-09 DIAGNOSIS — M79672 Pain in left foot: Secondary | ICD-10-CM | POA: Diagnosis not present

## 2015-02-09 DIAGNOSIS — M79671 Pain in right foot: Secondary | ICD-10-CM | POA: Diagnosis not present

## 2015-02-10 DIAGNOSIS — R52 Pain, unspecified: Secondary | ICD-10-CM

## 2015-02-11 DIAGNOSIS — M79672 Pain in left foot: Secondary | ICD-10-CM | POA: Diagnosis not present

## 2015-02-11 DIAGNOSIS — M79671 Pain in right foot: Secondary | ICD-10-CM | POA: Diagnosis not present

## 2015-02-15 DIAGNOSIS — M79671 Pain in right foot: Secondary | ICD-10-CM | POA: Diagnosis not present

## 2015-02-15 DIAGNOSIS — M79672 Pain in left foot: Secondary | ICD-10-CM | POA: Diagnosis not present

## 2015-02-18 ENCOUNTER — Ambulatory Visit (INDEPENDENT_AMBULATORY_CARE_PROVIDER_SITE_OTHER): Payer: Medicare Other | Admitting: Podiatry

## 2015-02-18 ENCOUNTER — Encounter: Payer: Self-pay | Admitting: Podiatry

## 2015-02-18 VITALS — BP 120/85 | HR 91 | Resp 16

## 2015-02-18 DIAGNOSIS — Z9889 Other specified postprocedural states: Secondary | ICD-10-CM

## 2015-02-18 NOTE — Progress Notes (Signed)
She presents today states that she is continuing to have pain as she proceeds through physical therapy. Typically she has 3 days a week but has now decreased it to 2 days a week because of the electrical type pains that she is developing after going to physical therapy. She states that the pains are shocking in nature and cause her feet to twitch in her legs to become painful.  Objective: Vital signs are stable she's alert and oriented 3 she still has tenderness upon palpation and range of motion of the first metatarsophalangeal joint of the left foot.  Assessment: Well-healed surgical foot left. Chronic pain associated with other comorbidities.  Plan: Discussed etiology pathology conservative versus surgical therapy. I encouraged contrast baths and the use of a T.E.N.S unit which she has at home. I will follow up with her in approximately a month she'll call sooner if needed.

## 2015-02-22 ENCOUNTER — Other Ambulatory Visit: Payer: Self-pay | Admitting: Family

## 2015-02-24 DIAGNOSIS — M79672 Pain in left foot: Secondary | ICD-10-CM | POA: Diagnosis not present

## 2015-02-24 DIAGNOSIS — M79671 Pain in right foot: Secondary | ICD-10-CM | POA: Diagnosis not present

## 2015-03-01 DIAGNOSIS — M79671 Pain in right foot: Secondary | ICD-10-CM | POA: Diagnosis not present

## 2015-03-01 DIAGNOSIS — M79672 Pain in left foot: Secondary | ICD-10-CM | POA: Diagnosis not present

## 2015-03-03 DIAGNOSIS — M79672 Pain in left foot: Secondary | ICD-10-CM | POA: Diagnosis not present

## 2015-03-03 DIAGNOSIS — M79671 Pain in right foot: Secondary | ICD-10-CM | POA: Diagnosis not present

## 2015-03-10 DIAGNOSIS — M79671 Pain in right foot: Secondary | ICD-10-CM | POA: Diagnosis not present

## 2015-03-10 DIAGNOSIS — M79672 Pain in left foot: Secondary | ICD-10-CM | POA: Diagnosis not present

## 2015-03-11 DIAGNOSIS — M25675 Stiffness of left foot, not elsewhere classified: Secondary | ICD-10-CM | POA: Diagnosis not present

## 2015-03-11 DIAGNOSIS — M79672 Pain in left foot: Secondary | ICD-10-CM | POA: Diagnosis not present

## 2015-03-11 DIAGNOSIS — M25674 Stiffness of right foot, not elsewhere classified: Secondary | ICD-10-CM | POA: Diagnosis not present

## 2015-03-11 DIAGNOSIS — M79671 Pain in right foot: Secondary | ICD-10-CM | POA: Diagnosis not present

## 2015-03-15 DIAGNOSIS — M79671 Pain in right foot: Secondary | ICD-10-CM | POA: Diagnosis not present

## 2015-03-15 DIAGNOSIS — M25675 Stiffness of left foot, not elsewhere classified: Secondary | ICD-10-CM | POA: Diagnosis not present

## 2015-03-15 DIAGNOSIS — M25674 Stiffness of right foot, not elsewhere classified: Secondary | ICD-10-CM | POA: Diagnosis not present

## 2015-03-15 DIAGNOSIS — M79672 Pain in left foot: Secondary | ICD-10-CM | POA: Diagnosis not present

## 2015-03-17 ENCOUNTER — Ambulatory Visit: Payer: Medicare Other | Admitting: Neurology

## 2015-03-17 DIAGNOSIS — M79671 Pain in right foot: Secondary | ICD-10-CM | POA: Diagnosis not present

## 2015-03-17 DIAGNOSIS — M25675 Stiffness of left foot, not elsewhere classified: Secondary | ICD-10-CM | POA: Diagnosis not present

## 2015-03-17 DIAGNOSIS — M25674 Stiffness of right foot, not elsewhere classified: Secondary | ICD-10-CM | POA: Diagnosis not present

## 2015-03-17 DIAGNOSIS — M79672 Pain in left foot: Secondary | ICD-10-CM | POA: Diagnosis not present

## 2015-03-18 ENCOUNTER — Ambulatory Visit: Payer: Medicare Other | Admitting: Podiatry

## 2015-03-19 ENCOUNTER — Encounter: Payer: Self-pay | Admitting: Neurology

## 2015-03-20 ENCOUNTER — Other Ambulatory Visit: Payer: Self-pay | Admitting: Family

## 2015-03-23 ENCOUNTER — Ambulatory Visit (INDEPENDENT_AMBULATORY_CARE_PROVIDER_SITE_OTHER): Payer: Medicare Other | Admitting: Podiatry

## 2015-03-23 ENCOUNTER — Encounter: Payer: Self-pay | Admitting: Podiatry

## 2015-03-23 VITALS — BP 138/88 | HR 89 | Resp 12

## 2015-03-23 DIAGNOSIS — Z9889 Other specified postprocedural states: Secondary | ICD-10-CM | POA: Diagnosis not present

## 2015-03-23 DIAGNOSIS — G5792 Unspecified mononeuropathy of left lower limb: Secondary | ICD-10-CM | POA: Diagnosis not present

## 2015-03-23 NOTE — Progress Notes (Signed)
Molly [name] presents today for follow-up of her left foot. She states that is doing much better since she started physical therapy. States that the right foot is doing nearly 100% better and she only has some burning beneath the scar on the dorsal aspect of the left foot. She relates great improvement with physical therapy.  Objective: Vital signs are stable she is alert and oriented 3. Pulses are palpable bilateral. She has great range of motion of the first metatarsophalangeal joints bilaterally and no edema of the bilateral foot. She has a reactive hypertrophic scar to the dorsal aspect of the old incision site left foot. The worst part of the scar is from the metatarsophalangeal joint distally overlying the proximal phalanx of the left hallux. This is tender on palpation with radiating pains.  Assessment: Well-healing surgical sites bilateral. Neuritis related to a hypertrophic scar dorsal aspect left foot.  Plan: Injected the area today with 2 mg of dexamethasone to the point of maximal tenderness to alleviate her symptoms. This alleviated her symptoms immediately. I will follow-up with her in 1 month.

## 2015-03-24 DIAGNOSIS — M79671 Pain in right foot: Secondary | ICD-10-CM | POA: Diagnosis not present

## 2015-03-24 DIAGNOSIS — M25675 Stiffness of left foot, not elsewhere classified: Secondary | ICD-10-CM | POA: Diagnosis not present

## 2015-03-24 DIAGNOSIS — M25674 Stiffness of right foot, not elsewhere classified: Secondary | ICD-10-CM | POA: Diagnosis not present

## 2015-03-24 DIAGNOSIS — M79672 Pain in left foot: Secondary | ICD-10-CM | POA: Diagnosis not present

## 2015-03-29 DIAGNOSIS — M79672 Pain in left foot: Secondary | ICD-10-CM | POA: Diagnosis not present

## 2015-03-29 DIAGNOSIS — M79671 Pain in right foot: Secondary | ICD-10-CM | POA: Diagnosis not present

## 2015-03-29 DIAGNOSIS — M25675 Stiffness of left foot, not elsewhere classified: Secondary | ICD-10-CM | POA: Diagnosis not present

## 2015-03-29 DIAGNOSIS — M25674 Stiffness of right foot, not elsewhere classified: Secondary | ICD-10-CM | POA: Diagnosis not present

## 2015-03-31 DIAGNOSIS — M25674 Stiffness of right foot, not elsewhere classified: Secondary | ICD-10-CM | POA: Diagnosis not present

## 2015-03-31 DIAGNOSIS — M79672 Pain in left foot: Secondary | ICD-10-CM | POA: Diagnosis not present

## 2015-03-31 DIAGNOSIS — M25675 Stiffness of left foot, not elsewhere classified: Secondary | ICD-10-CM | POA: Diagnosis not present

## 2015-03-31 DIAGNOSIS — M79671 Pain in right foot: Secondary | ICD-10-CM | POA: Diagnosis not present

## 2015-04-02 ENCOUNTER — Other Ambulatory Visit: Payer: Self-pay

## 2015-04-02 MED ORDER — HYDROCODONE-ACETAMINOPHEN 5-325 MG PO TABS: 1.0000 | ORAL_TABLET | ORAL | Status: DC | PRN

## 2015-04-05 DIAGNOSIS — M79672 Pain in left foot: Secondary | ICD-10-CM | POA: Diagnosis not present

## 2015-04-05 DIAGNOSIS — M25674 Stiffness of right foot, not elsewhere classified: Secondary | ICD-10-CM | POA: Diagnosis not present

## 2015-04-05 DIAGNOSIS — M25675 Stiffness of left foot, not elsewhere classified: Secondary | ICD-10-CM | POA: Diagnosis not present

## 2015-04-05 DIAGNOSIS — M79671 Pain in right foot: Secondary | ICD-10-CM | POA: Diagnosis not present

## 2015-04-07 DIAGNOSIS — M79672 Pain in left foot: Secondary | ICD-10-CM | POA: Diagnosis not present

## 2015-04-07 DIAGNOSIS — M79671 Pain in right foot: Secondary | ICD-10-CM | POA: Diagnosis not present

## 2015-04-07 DIAGNOSIS — M25675 Stiffness of left foot, not elsewhere classified: Secondary | ICD-10-CM | POA: Diagnosis not present

## 2015-04-07 DIAGNOSIS — M25674 Stiffness of right foot, not elsewhere classified: Secondary | ICD-10-CM | POA: Diagnosis not present

## 2015-04-13 DIAGNOSIS — M79672 Pain in left foot: Secondary | ICD-10-CM | POA: Diagnosis not present

## 2015-04-13 DIAGNOSIS — M25675 Stiffness of left foot, not elsewhere classified: Secondary | ICD-10-CM | POA: Diagnosis not present

## 2015-04-13 DIAGNOSIS — M25674 Stiffness of right foot, not elsewhere classified: Secondary | ICD-10-CM | POA: Diagnosis not present

## 2015-04-13 DIAGNOSIS — M79671 Pain in right foot: Secondary | ICD-10-CM | POA: Diagnosis not present

## 2015-04-14 DIAGNOSIS — M79671 Pain in right foot: Secondary | ICD-10-CM | POA: Diagnosis not present

## 2015-04-14 DIAGNOSIS — M25675 Stiffness of left foot, not elsewhere classified: Secondary | ICD-10-CM | POA: Diagnosis not present

## 2015-04-14 DIAGNOSIS — M25674 Stiffness of right foot, not elsewhere classified: Secondary | ICD-10-CM | POA: Diagnosis not present

## 2015-04-14 DIAGNOSIS — M79672 Pain in left foot: Secondary | ICD-10-CM | POA: Diagnosis not present

## 2015-04-17 ENCOUNTER — Other Ambulatory Visit: Payer: Self-pay | Admitting: Family

## 2015-04-20 ENCOUNTER — Ambulatory Visit: Payer: Medicare Other | Admitting: Podiatry

## 2015-04-24 ENCOUNTER — Other Ambulatory Visit: Payer: Self-pay | Admitting: Family

## 2015-04-25 ENCOUNTER — Other Ambulatory Visit: Payer: Self-pay | Admitting: Family

## 2015-04-27 ENCOUNTER — Encounter: Payer: Self-pay | Admitting: Podiatry

## 2015-04-27 ENCOUNTER — Ambulatory Visit (INDEPENDENT_AMBULATORY_CARE_PROVIDER_SITE_OTHER): Payer: Medicare Other | Admitting: Podiatry

## 2015-04-27 VITALS — BP 158/95 | HR 88 | Resp 16

## 2015-04-27 DIAGNOSIS — R52 Pain, unspecified: Secondary | ICD-10-CM

## 2015-04-27 DIAGNOSIS — M2012 Hallux valgus (acquired), left foot: Secondary | ICD-10-CM

## 2015-04-27 DIAGNOSIS — Z9889 Other specified postprocedural states: Secondary | ICD-10-CM

## 2015-04-27 DIAGNOSIS — L905 Scar conditions and fibrosis of skin: Secondary | ICD-10-CM

## 2015-04-27 DIAGNOSIS — M25674 Stiffness of right foot, not elsewhere classified: Secondary | ICD-10-CM | POA: Diagnosis not present

## 2015-04-27 DIAGNOSIS — M79672 Pain in left foot: Secondary | ICD-10-CM | POA: Diagnosis not present

## 2015-04-27 DIAGNOSIS — M25675 Stiffness of left foot, not elsewhere classified: Secondary | ICD-10-CM | POA: Diagnosis not present

## 2015-04-27 DIAGNOSIS — M2021 Hallux rigidus, right foot: Secondary | ICD-10-CM

## 2015-04-27 DIAGNOSIS — M79671 Pain in right foot: Secondary | ICD-10-CM | POA: Diagnosis not present

## 2015-04-27 NOTE — Progress Notes (Signed)
She presents today for follow-up of her bilateral foot surgery states this seems to be doing better though she started getting some electrical type shocks through her right big toe. Her left big toe is doing well with exception of the distalmost aspect of the scar which is painful.  Assessment: Painful scar left foot.  Plan: Injected today with dexamethasone.

## 2015-04-29 DIAGNOSIS — M79672 Pain in left foot: Secondary | ICD-10-CM | POA: Diagnosis not present

## 2015-04-29 DIAGNOSIS — M25675 Stiffness of left foot, not elsewhere classified: Secondary | ICD-10-CM | POA: Diagnosis not present

## 2015-04-29 DIAGNOSIS — M79671 Pain in right foot: Secondary | ICD-10-CM | POA: Diagnosis not present

## 2015-04-29 DIAGNOSIS — M25674 Stiffness of right foot, not elsewhere classified: Secondary | ICD-10-CM | POA: Diagnosis not present

## 2015-05-04 DIAGNOSIS — M25675 Stiffness of left foot, not elsewhere classified: Secondary | ICD-10-CM | POA: Diagnosis not present

## 2015-05-04 DIAGNOSIS — M79671 Pain in right foot: Secondary | ICD-10-CM | POA: Diagnosis not present

## 2015-05-04 DIAGNOSIS — M79672 Pain in left foot: Secondary | ICD-10-CM | POA: Diagnosis not present

## 2015-05-04 DIAGNOSIS — M25674 Stiffness of right foot, not elsewhere classified: Secondary | ICD-10-CM | POA: Diagnosis not present

## 2015-05-12 ENCOUNTER — Ambulatory Visit: Payer: Medicare Other | Admitting: Family

## 2015-05-18 ENCOUNTER — Ambulatory Visit: Payer: Medicare Other | Admitting: Neurology

## 2015-05-24 ENCOUNTER — Other Ambulatory Visit: Payer: Self-pay | Admitting: Family

## 2015-05-27 ENCOUNTER — Telehealth: Payer: Self-pay | Admitting: Family

## 2015-05-27 DIAGNOSIS — R590 Localized enlarged lymph nodes: Secondary | ICD-10-CM

## 2015-05-27 NOTE — Telephone Encounter (Signed)
Orders have been placed. Attempted to call [facility] and left voicemail to call back.

## 2015-05-27 NOTE — Telephone Encounter (Signed)
Called and spoke to [place] at [facility] imaging. Aware I have placed the orders and she will be contacting the patient.

## 2015-05-27 NOTE — Telephone Encounter (Signed)
Daphane Shepherd called from [facility] [phone], she needs the orders corrected for the [place] UPPER EXT ART RIGHT -- the code that Daphane Shepherd gave is: [id number] upper ART and she also needs the order corrected for [place] EXTREM UP RIGHT LTD -- the code that she gave is: [id number] upper venous. After these are fixed she was wanting a call back to call the patient and schedule an appointment.

## 2015-05-28 ENCOUNTER — Ambulatory Visit (HOSPITAL_COMMUNITY): Payer: Medicare Other | Attending: Cardiology

## 2015-05-28 DIAGNOSIS — R590 Localized enlarged lymph nodes: Secondary | ICD-10-CM | POA: Insufficient documentation

## 2015-05-28 DIAGNOSIS — I998 Other disorder of circulatory system: Secondary | ICD-10-CM

## 2015-05-31 ENCOUNTER — Ambulatory Visit (HOSPITAL_COMMUNITY): Payer: Medicare Other | Attending: Cardiology

## 2015-05-31 ENCOUNTER — Telehealth: Payer: Self-pay | Admitting: Family

## 2015-05-31 DIAGNOSIS — R590 Localized enlarged lymph nodes: Secondary | ICD-10-CM

## 2015-05-31 NOTE — Telephone Encounter (Signed)
[facility] called re this patients studies to be done:   [place] UPPER EXT ART [834621] [facility] UE ART DUPLEX LTD/UNI [947125]   They needed a supporting dx code for the arterial study but could not find one. Pt did not have arterial study done today but if you still want her to have one they will need a dx code in order to r/s her.

## 2015-06-02 ENCOUNTER — Encounter: Payer: Self-pay | Admitting: Family

## 2015-06-02 ENCOUNTER — Telehealth: Payer: Self-pay | Admitting: Family

## 2015-06-02 ENCOUNTER — Ambulatory Visit (INDEPENDENT_AMBULATORY_CARE_PROVIDER_SITE_OTHER): Payer: Medicare Other | Admitting: Family

## 2015-06-02 ENCOUNTER — Ambulatory Visit (INDEPENDENT_AMBULATORY_CARE_PROVIDER_SITE_OTHER)
Admission: RE | Admit: 2015-06-02 | Discharge: 2015-06-02 | Disposition: A | Discharge status: Home / Self Care | Payer: Medicare Other | Source: Ambulatory Visit | Attending: Cardiovascular Disease | Admitting: Cardiovascular Disease

## 2015-06-02 VITALS — BP 130/82 | HR 80 | Temp 98.5°F | Wt 169.0 lb

## 2015-06-02 DIAGNOSIS — F3161 Bipolar disorder, current episode mixed, mild: Secondary | ICD-10-CM | POA: Diagnosis not present

## 2015-06-02 DIAGNOSIS — M179 Osteoarthritis of knee, unspecified: Secondary | ICD-10-CM

## 2015-06-02 DIAGNOSIS — R590 Localized enlarged lymph nodes: Secondary | ICD-10-CM

## 2015-06-02 DIAGNOSIS — M1710 Unilateral primary osteoarthritis, unspecified knee: Secondary | ICD-10-CM

## 2015-06-02 DIAGNOSIS — I999 Unspecified disorder of circulatory system: Secondary | ICD-10-CM

## 2015-06-02 DIAGNOSIS — I82C12 Acute embolism and thrombosis of left internal jugular vein: Secondary | ICD-10-CM | POA: Diagnosis not present

## 2015-06-02 DIAGNOSIS — M797 Fibromyalgia: Secondary | ICD-10-CM | POA: Diagnosis not present

## 2015-06-02 DIAGNOSIS — I998 Other disorder of circulatory system: Secondary | ICD-10-CM

## 2015-06-02 DIAGNOSIS — R591 Generalized enlarged lymph nodes: Secondary | ICD-10-CM | POA: Diagnosis not present

## 2015-06-02 DIAGNOSIS — IMO0002 Reserved for concepts with insufficient information to code with codable children: Secondary | ICD-10-CM

## 2015-06-02 DIAGNOSIS — K219 Gastro-esophageal reflux disease without esophagitis: Secondary | ICD-10-CM

## 2015-06-02 DIAGNOSIS — M47812 Spondylosis without myelopathy or radiculopathy, cervical region: Secondary | ICD-10-CM | POA: Diagnosis not present

## 2015-06-02 MED ORDER — IOHEXOL 350 MG/ML SOLN
75.0000 mL | Freq: Once | INTRAVENOUS | Status: AC | PRN
  Administered 2015-06-02: 75 mL via INTRAVENOUS

## 2015-06-02 NOTE — Patient Instructions (Signed)
[facility] 3rd floor [address].

## 2015-06-02 NOTE — Telephone Encounter (Signed)
Please advise that she needs to have a CT scan of the neck to evaluate possible occlusion in neck

## 2015-06-02 NOTE — Progress Notes (Signed)
Subjective:    Patient ID: Molly [name], female    DOB: [birth year], 39 y.o.   MRN: [id number]  HPI  39 year old AAF with a history of Bipolar disorder, osteoarthritis, Migraine headaches, Fibrmyalgia, Carpal Tunnel Syndrome, GERD is in today for a follow-up. At her last OV she was instructed to have an ultrasound of her upper extremity after complaining of swelling and pain. She just had that test done 1 week ago that showed a possible calcified left internal jugular vein of the neck. She reports periods of memory loss. She was ordered to have a CT angiogram and scheduled for today.    Review of Systems  Constitutional: Negative.   HENT: Negative.   Respiratory: Negative.   Cardiovascular: Negative.   Gastrointestinal: Negative.   Endocrine: Negative.   Genitourinary: Negative.   Musculoskeletal: Positive for myalgias and arthralgias.  Skin: Negative.   Neurological: Negative for dizziness and tremors.  Hematological: Negative.   Psychiatric/Behavioral: Positive for self-injury. The patient is nervous/anxious.   All other systems reviewed and are negative.  Past Medical History  Diagnosis Date  . Migraine   . GERD (gastroesophageal reflux disease)   . Lumbago   . Seizures     last seizure nov 2014  . Allergic rhinitis   . Fibromyalgia   . Carpal tunnel syndrome on both sides   . High cholesterol     History   Social History  . Marital Status: Legally Separated    Spouse Name: N/A  . Number of Children: 2  . Years of Education: 12+   Occupational History  . unemployed    Social History Main Topics  . Smoking status: Never Smoker   . Smokeless tobacco: Never Used  . Alcohol Use: No  . Drug Use: No  . Sexual Activity: Not on file   Other Topics Concern  . Not on file   Social History Narrative   Patient lives at home.    Patient has 2 children.    Patient has a college education.    Patient is right handed.     Past Surgical History  Procedure  Laterality Date  . Abdominal hysterectomy    . Tubal ligation    . Foot surgery      bilateral, toe surgery  . Bunionectomy Right 11-07-2013  . Aiken osteotomy Right 11-07-2013    Family History  Problem Relation Age of Onset  . Seizures Mother   . Emphysema Father   . Seizures Maternal Grandmother   . Seizures Sister   . Bipolar disorder Sister     Allergies  Allergen Reactions  . Fish Allergy     Throat closes, and hives.   Marland Kitchen Phenergan [Promethazine Hcl] Swelling  . Toradol [Ketorolac Tromethamine] Hives    Current Outpatient Prescriptions on File Prior to Visit  Medication Sig Dispense Refill  . ammonium lactate (LAC-HYDRIN) 12 % lotion Apply 1 application topically as needed for dry skin. 400 g 0  . aspirin 325 MG tablet Take 325 mg by mouth daily.    . cetirizine (ZYRTEC) 10 MG tablet TAKE 1 TABLET EVERY DAY 90 tablet 2  . econazole nitrate 1 % cream Apply topically daily. 85 g 5  . escitalopram (LEXAPRO) 10 MG tablet TAKE 1 TABLET (10 MG TOTAL) BY MOUTH DAILY. 30 tablet 0  . fluticasone (FLONASE) 50 MCG/ACT nasal spray USE 2 SPRAYS IN EACH NOSTRIL ONCE DAILY 16 g 0  . fluticasone (VERAMYST) 27.5 MCG/SPRAY nasal spray Place 2  sprays into the nose daily.    . Lacosamide 100 MG TABS Take 2 tablets twice a day 120 tablet 4  . Linaclotide (LINZESS) 145 MCG CAPS capsule Take 1 capsule (145 mcg total) by mouth daily. 30 capsule 6  . mirtazapine (REMERON) 30 MG tablet TAKE 1 TABLET BY MOUTH AT BEDTIME 30 tablet 0  . neomycin-polymyxin-hydrocortisone (CORTISPORIN) otic solution 1-2 drops to the toe after soaking twice daily 10 mL 1  . NEXIUM 20 MG capsule TAKE ONE CAPSULE BY MOUTH EVERY DAY 90 capsule 1  . olopatadine (PATANOL) 0.1 % ophthalmic solution Place 1 drop into both eyes 2 (two) times daily.    . polyethylene glycol (MIRALAX / GLYCOLAX) packet MIX CONTENTS OF 1 PACKET WITH 4 TO 8 OUNCES OF LIQUID AND DRINK EVERY DAY 30 packet 0  . rizatriptan (MAXALT-MLT) 10 MG  disintegrating tablet Take 10 mg by mouth 3 (three) times daily as needed for migraine. May repeat in 2 hours if needed    . topiramate (TOPAMAX) 100 MG tablet TAKE 1 TABLET BY MOUTH TWICE DAILY 180 tablet 0  . zolpidem (AMBIEN) 10 MG tablet Take 1 tablet (10 mg total) by mouth at bedtime. 30 tablet 0  . cyclobenzaprine (FLEXERIL) 10 MG tablet TAKE 1 TABLET BY MOUTH 3 TIMES A DAY AS NEEDED FOR SPASMS 30 tablet 0  . HYDROcodone-acetaminophen (NORCO/VICODIN) 5-325 MG per tablet Take 1-2 tablets by mouth every 4 (four) hours as needed for moderate pain. 30 tablet 0  . ibuprofen (ADVIL,MOTRIN) 800 MG tablet Take 1 tablet (800 mg total) by mouth every 8 (eight) hours as needed. 30 tablet 3  . metoCLOPramide (REGLAN) 10 MG tablet Take 1 tablet (10 mg total) by mouth every 6 (six) hours as needed for nausea. 30 tablet 0  . ondansetron (ZOFRAN) 8 MG tablet Take 8 mg by mouth 3 (three) times daily as needed for nausea or vomiting.    Marland Kitchen oxyCODONE-acetaminophen (PERCOCET) 10-325 MG per tablet Take one tablet by mouth every six hours as needed for pain. 30 tablet 0   No current facility-administered medications on file prior to visit.    BP 130/82 mmHg  Pulse 80  Temp(Src) 98.5 F (36.9 C)  Wt 169 lb (76.658 kg)chart    Objective:   Physical Exam  Constitutional: She is oriented to person, place, and time. She appears well-developed and well-nourished.  HENT:  Right Ear: External ear normal.  Left Ear: External ear normal.  Nose: Nose normal.  Mouth/Throat: Oropharynx is clear and moist.  Neck: Normal range of motion. Neck supple. No thyromegaly present.  Cardiovascular: Normal rate, regular rhythm and normal heart sounds.   Pulmonary/Chest: Effort normal and breath sounds normal.  Abdominal: Soft. Bowel sounds are normal.  Musculoskeletal: Normal range of motion.  Neurological: She is alert and oriented to person, place, and time.  Skin: Skin is warm and dry.  Psychiatric: She has a normal  mood and affect.          Assessment & Plan:  Molly [name] was seen today for medication refill.  Diagnoses and all orders for this visit:  Gastroesophageal reflux disease without esophagitis  Occlusion of left jugular vein  Bipolar disorder, current episode mixed, mild  Fibromyalgia  Osteoarthrosis, unspecified whether generalized or localized, involving lower leg  Other orders -     cyclobenzaprine (FLEXERIL) 10 MG tablet; TAKE 1 TABLET BY MOUTH 3 TIMES A DAY AS NEEDED FOR SPASMS -     escitalopram (LEXAPRO) 10 MG tablet;  TAKE 1 TABLET (10 MG TOTAL) BY MOUTH DAILY. -     fluticasone (FLONASE) 50 MCG/ACT nasal spray; Place 2 sprays into both nostrils daily. -     ibuprofen (ADVIL,MOTRIN) 800 MG tablet; Take 1 tablet (800 mg total) by mouth every 8 (eight) hours as needed. -     Linaclotide (LINZESS) 145 MCG CAPS capsule; Take 1 capsule (145 mcg total) by mouth daily. -     mirtazapine (REMERON) 30 MG tablet; Take 1 tablet (30 mg total) by mouth at bedtime. -     esomeprazole (NEXIUM) 20 MG capsule; Take 1 capsule (20 mg total) by mouth daily. -     rizatriptan (MAXALT-MLT) 10 MG disintegrating tablet; Take 1 tablet (10 mg total) by mouth 3 (three) times daily as needed for migraine. May repeat in 2 hours if needed -     topiramate (TOPAMAX) 100 MG tablet; Take 1 tablet (100 mg total) by mouth 2 (two) times daily.   Patient to [facility] today for CT Angio of Neck asap. Will recheck her chronic conditions in 2 weeks.   Rx refills to pharmacy.

## 2015-06-02 NOTE — Telephone Encounter (Signed)
That was ordered in March will need to be re-evaluated.

## 2015-06-02 NOTE — Telephone Encounter (Signed)
Bevelyn Ngo, this appears to be clinical in nature. Can you please advise the patient?

## 2015-06-02 NOTE — Telephone Encounter (Signed)
Pt was seen in office and had scan today.

## 2015-06-02 NOTE — Telephone Encounter (Signed)
Sundeep, [place] [birth year] received a call from [place] patient did not show up for her CT that was scheduled today , I called the patient several times to find out why she didn't she  go to her scheduled  appt .  No answer vm is not set up.

## 2015-06-03 ENCOUNTER — Ambulatory Visit (INDEPENDENT_AMBULATORY_CARE_PROVIDER_SITE_OTHER): Payer: Medicare Other | Admitting: Podiatry

## 2015-06-03 ENCOUNTER — Encounter: Payer: Self-pay | Admitting: Podiatry

## 2015-06-03 VITALS — BP 136/86 | HR 69 | Resp 12

## 2015-06-03 DIAGNOSIS — L600 Ingrowing nail: Secondary | ICD-10-CM | POA: Diagnosis not present

## 2015-06-03 NOTE — Progress Notes (Signed)
She presents today for follow-up of the surgical foot. She states this seems to be doing much better now that she has nearly completed her physical therapy. She states that the scar is still tender and she still has some swelling in the toe. She does complain of painful ingrown toenail to the tibia and fibula border of the hallux left.  Objective: Vital signs are stable she is alert and oriented 3. Pulses are palpable left foot. Mild hypertrophic scar overlying the incision site of the Akin osteotomy left hallux. This is gone on to heal uneventfully unfortunately she does have a reactive hypertrophic scar. Sharp incurvated nail margins of the hallux do not demonstrate any type of infection at this point in time I did suggest that she consider excision of the margins and she will do so in the future.  Assessment: Well-healing surgical toe with mild hypertrophic scar left hallux. Pain is doing much better after physical therapy. Mild ingrown toenail to the hallux left.  Plan: Discussed etiology pathology conservative or surgical therapies. Consider surgical matrixectomy's in the future.

## 2015-06-04 MED ORDER — TOPIRAMATE 100 MG PO TABS: 100.0000 mg | ORAL_TABLET | Freq: Two times a day (BID) | ORAL | Status: DC

## 2015-06-04 MED ORDER — CYCLOBENZAPRINE HCL 10 MG PO TABS: ORAL_TABLET | ORAL | Status: DC

## 2015-06-04 MED ORDER — RIZATRIPTAN BENZOATE 10 MG PO TBDP: 10.0000 mg | ORAL_TABLET | Freq: Three times a day (TID) | ORAL | Status: DC | PRN

## 2015-06-04 MED ORDER — MIRTAZAPINE 30 MG PO TABS: 30.0000 mg | ORAL_TABLET | Freq: Every day | ORAL | Status: DC

## 2015-06-04 MED ORDER — IBUPROFEN 800 MG PO TABS: 800.0000 mg | ORAL_TABLET | Freq: Three times a day (TID) | ORAL | Status: DC | PRN

## 2015-06-04 MED ORDER — ESOMEPRAZOLE MAGNESIUM 20 MG PO CPDR: 20.0000 mg | DELAYED_RELEASE_CAPSULE | Freq: Every day | ORAL | Status: DC

## 2015-06-04 MED ORDER — FLUTICASONE PROPIONATE 50 MCG/ACT NA SUSP: 2.0000 | Freq: Every day | NASAL | Status: DC

## 2015-06-04 MED ORDER — ESCITALOPRAM OXALATE 10 MG PO TABS: ORAL_TABLET | ORAL | Status: DC

## 2015-06-04 MED ORDER — LINACLOTIDE 145 MCG PO CAPS: 145.0000 ug | ORAL_CAPSULE | Freq: Every day | ORAL | Status: DC

## 2015-06-18 ENCOUNTER — Ambulatory Visit: Payer: Medicare Other | Admitting: Neurology

## 2015-06-22 ENCOUNTER — Encounter: Payer: Self-pay | Admitting: Neurology

## 2015-06-22 ENCOUNTER — Inpatient Hospital Stay (HOSPITAL_COMMUNITY)
Admission: AD | Admit: 2015-06-22 | Discharge: 2015-06-23 | DRG: 313 | Disposition: A | Discharge status: Home / Self Care | Payer: Medicare Other | Source: Ambulatory Visit | Attending: Cardiovascular Disease | Admitting: Cardiovascular Disease

## 2015-06-22 ENCOUNTER — Ambulatory Visit (INDEPENDENT_AMBULATORY_CARE_PROVIDER_SITE_OTHER): Payer: Medicare Other | Admitting: Neurology

## 2015-06-22 VITALS — BP 110/70 | HR 99 | Resp 16 | Ht 69.0 in | Wt 172.0 lb

## 2015-06-22 DIAGNOSIS — E780 Pure hypercholesterolemia: Secondary | ICD-10-CM | POA: Diagnosis present

## 2015-06-22 DIAGNOSIS — R569 Unspecified convulsions: Secondary | ICD-10-CM

## 2015-06-22 DIAGNOSIS — Z8489 Family history of other specified conditions: Secondary | ICD-10-CM | POA: Diagnosis not present

## 2015-06-22 DIAGNOSIS — Z888 Allergy status to other drugs, medicaments and biological substances status: Secondary | ICD-10-CM | POA: Diagnosis not present

## 2015-06-22 DIAGNOSIS — M545 Low back pain: Secondary | ICD-10-CM | POA: Diagnosis present

## 2015-06-22 DIAGNOSIS — G5602 Carpal tunnel syndrome, left upper limb: Secondary | ICD-10-CM | POA: Diagnosis present

## 2015-06-22 DIAGNOSIS — G5601 Carpal tunnel syndrome, right upper limb: Secondary | ICD-10-CM | POA: Diagnosis present

## 2015-06-22 DIAGNOSIS — E785 Hyperlipidemia, unspecified: Secondary | ICD-10-CM | POA: Diagnosis present

## 2015-06-22 DIAGNOSIS — Z91013 Allergy to seafood: Secondary | ICD-10-CM | POA: Diagnosis not present

## 2015-06-22 DIAGNOSIS — Z885 Allergy status to narcotic agent status: Secondary | ICD-10-CM | POA: Diagnosis not present

## 2015-06-22 DIAGNOSIS — R079 Chest pain, unspecified: Principal | ICD-10-CM | POA: Diagnosis present

## 2015-06-22 DIAGNOSIS — R519 Headache, unspecified: Secondary | ICD-10-CM

## 2015-06-22 DIAGNOSIS — R51 Headache: Secondary | ICD-10-CM | POA: Diagnosis not present

## 2015-06-22 DIAGNOSIS — M797 Fibromyalgia: Secondary | ICD-10-CM | POA: Diagnosis present

## 2015-06-22 DIAGNOSIS — R110 Nausea: Secondary | ICD-10-CM | POA: Diagnosis not present

## 2015-06-22 DIAGNOSIS — G43009 Migraine without aura, not intractable, without status migrainosus: Secondary | ICD-10-CM | POA: Diagnosis present

## 2015-06-22 DIAGNOSIS — R072 Precordial pain: Secondary | ICD-10-CM | POA: Diagnosis not present

## 2015-06-22 DIAGNOSIS — Z79899 Other long term (current) drug therapy: Secondary | ICD-10-CM | POA: Diagnosis not present

## 2015-06-22 DIAGNOSIS — Z7982 Long term (current) use of aspirin: Secondary | ICD-10-CM | POA: Diagnosis not present

## 2015-06-22 DIAGNOSIS — F319 Bipolar disorder, unspecified: Secondary | ICD-10-CM | POA: Diagnosis present

## 2015-06-22 DIAGNOSIS — K219 Gastro-esophageal reflux disease without esophagitis: Secondary | ICD-10-CM | POA: Diagnosis present

## 2015-06-22 DIAGNOSIS — Z90710 Acquired absence of both cervix and uterus: Secondary | ICD-10-CM | POA: Diagnosis not present

## 2015-06-22 DIAGNOSIS — J309 Allergic rhinitis, unspecified: Secondary | ICD-10-CM | POA: Diagnosis present

## 2015-06-22 DIAGNOSIS — G43109 Migraine with aura, not intractable, without status migrainosus: Secondary | ICD-10-CM | POA: Diagnosis not present

## 2015-06-22 LAB — TROPONIN I: Troponin I: <0.03 ng/mL (ref <0.031)

## 2015-06-22 LAB — COMPREHENSIVE METABOLIC PANEL
ALT: 17 U/L (ref 14–54)
AST: 24 U/L (ref 15–41)
Albumin: 3.7 g/dL (ref 3.5–5.0)
Alkaline Phosphatase: 83 U/L (ref 38–126)
Anion gap: 9 (ref 5–15)
BUN: 13 mg/dL (ref 6–20)
CALCIUM: 9.2 mg/dL (ref 8.9–10.3)
CO2: 27 mmol/L (ref 22–32)
CREATININE: 0.97 mg/dL (ref 0.44–1.00)
Chloride: 102 mmol/L (ref 101–111)
GLUCOSE: 90 mg/dL (ref 65–99)
Potassium: 3.9 mmol/L (ref 3.5–5.1)
Sodium: 138 mmol/L (ref 135–145)
Total Bilirubin: 0.3 mg/dL (ref 0.3–1.2)
Total Protein: 7.1 g/dL (ref 6.5–8.1)

## 2015-06-22 LAB — PROTIME-INR
INR: 0.89 (ref 0.00–1.49)
PROTHROMBIN TIME: 12.2 s (ref 11.6–15.2)

## 2015-06-22 MED ORDER — ONDANSETRON HCL 4 MG/2ML IJ SOLN
4.0000 mg | Freq: Four times a day (QID) | INTRAMUSCULAR | Status: DC | PRN
  Administered 2015-06-22: 4 mg via INTRAVENOUS
  Filled 2015-06-22: qty 2

## 2015-06-22 MED ORDER — NITROGLYCERIN 0.4 MG SL SUBL: 0.4000 mg | SUBLINGUAL_TABLET | SUBLINGUAL | Status: DC | PRN

## 2015-06-22 MED ORDER — MIRTAZAPINE 30 MG PO TABS
30.0000 mg | ORAL_TABLET | Freq: Every day | ORAL | Status: DC
  Administered 2015-06-22: 30 mg via ORAL
  Filled 2015-06-22 (×2): qty 1

## 2015-06-22 MED ORDER — HEPARIN (PORCINE) IN NACL 100-0.45 UNIT/ML-% IJ SOLN
1000.0000 [IU]/h | INTRAMUSCULAR | Status: DC
  Administered 2015-06-22: 1200 [IU]/h via INTRAVENOUS
  Filled 2015-06-22 (×3): qty 250

## 2015-06-22 MED ORDER — PANTOPRAZOLE SODIUM 40 MG PO TBEC
40.0000 mg | DELAYED_RELEASE_TABLET | Freq: Every day | ORAL | Status: DC
  Administered 2015-06-22 – 2015-06-23 (×2): 40 mg via ORAL
  Filled 2015-06-22 (×2): qty 1

## 2015-06-22 MED ORDER — RIZATRIPTAN BENZOATE 10 MG PO TBDP
10.0000 mg | ORAL_TABLET | Freq: Three times a day (TID) | ORAL | Status: DC | PRN
Start: 2015-06-22 — End: 2015-06-23
  Filled 2015-06-22: qty 1

## 2015-06-22 MED ORDER — OXYCODONE-ACETAMINOPHEN 10-325 MG PO TABS: 1.0000 | ORAL_TABLET | Freq: Four times a day (QID) | ORAL | Status: DC | PRN

## 2015-06-22 MED ORDER — POLYETHYLENE GLYCOL 3350 17 G PO PACK
17.0000 g | PACK | Freq: Every day | ORAL | Status: DC
  Filled 2015-06-22: qty 1

## 2015-06-22 MED ORDER — METOCLOPRAMIDE HCL 10 MG PO TABS: 10.0000 mg | ORAL_TABLET | Freq: Four times a day (QID) | ORAL | Status: DC | PRN

## 2015-06-22 MED ORDER — ONDANSETRON HCL 4 MG PO TABS
8.0000 mg | ORAL_TABLET | Freq: Three times a day (TID) | ORAL | Status: DC | PRN
  Administered 2015-06-23: 8 mg via ORAL
  Filled 2015-06-22: qty 2

## 2015-06-22 MED ORDER — GABAPENTIN 100 MG PO CAPS: ORAL_CAPSULE | ORAL | Status: DC

## 2015-06-22 MED ORDER — OLOPATADINE HCL 0.1 % OP SOLN: 1.0000 [drp] | Freq: Two times a day (BID) | OPHTHALMIC | Status: DC

## 2015-06-22 MED ORDER — ACETAMINOPHEN 325 MG PO TABS
650.0000 mg | ORAL_TABLET | ORAL | Status: DC | PRN
  Filled 2015-06-22: qty 2

## 2015-06-22 MED ORDER — ZOLPIDEM TARTRATE 5 MG PO TABS
10.0000 mg | ORAL_TABLET | Freq: Every day | ORAL | Status: DC
  Filled 2015-06-22: qty 2

## 2015-06-22 MED ORDER — CYCLOBENZAPRINE HCL 10 MG PO TABS: 10.0000 mg | ORAL_TABLET | Freq: Three times a day (TID) | ORAL | Status: DC | PRN

## 2015-06-22 MED ORDER — OXYCODONE-ACETAMINOPHEN 5-325 MG PO TABS
1.0000 | ORAL_TABLET | Freq: Four times a day (QID) | ORAL | Status: DC | PRN
  Administered 2015-06-23: 1 via ORAL
  Filled 2015-06-22: qty 1

## 2015-06-22 MED ORDER — HEPARIN BOLUS VIA INFUSION
4000.0000 [IU] | Freq: Once | INTRAVENOUS | Status: AC
  Administered 2015-06-22: 4000 [IU] via INTRAVENOUS
  Filled 2015-06-22: qty 4000

## 2015-06-22 MED ORDER — GABAPENTIN 100 MG PO CAPS
100.0000 mg | ORAL_CAPSULE | Freq: Two times a day (BID) | ORAL | Status: DC
  Filled 2015-06-22 (×3): qty 1

## 2015-06-22 MED ORDER — OXYCODONE HCL 5 MG PO TABS
5.0000 mg | ORAL_TABLET | Freq: Four times a day (QID) | ORAL | Status: DC | PRN
Start: 2015-06-22 — End: 2015-06-23
  Administered 2015-06-23 (×2): 5 mg via ORAL
  Filled 2015-06-22 (×3): qty 1

## 2015-06-22 MED ORDER — TOPIRAMATE 100 MG PO TABS
100.0000 mg | ORAL_TABLET | Freq: Two times a day (BID) | ORAL | Status: DC
  Administered 2015-06-22 – 2015-06-23 (×2): 100 mg via ORAL
  Filled 2015-06-22 (×3): qty 1

## 2015-06-22 MED ORDER — FLUTICASONE PROPIONATE 50 MCG/ACT NA SUSP
2.0000 | Freq: Every day | NASAL | Status: DC
  Administered 2015-06-23: 2 via NASAL
  Filled 2015-06-22: qty 16

## 2015-06-22 NOTE — Progress Notes (Addendum)
New pt admission from Dr. Delice Lesch. Pt brought to the floor in stable condition. Vitals taken. Initial Assessment done. All immediate pertinent needs to patient addressed. Patient Guide given to patient. Important safety instructions relating to hospitalization reviewed with patient. Patient verbalized understanding. Will continue to monitor pt.  Pt AOx4. Pt instructed of high risk fall potential while hospitalized. Pt refused bed alarm precaution and wishes to "get up ad lib" with risk. Will continue to utilize other fall risk precautions with pt.   Maurene Capes RN

## 2015-06-22 NOTE — Progress Notes (Signed)
At 1719 pt arrived to floor from out pt. Via w/c .  Oriented to room.  Denies pain/discomfort.  Call bell at reach and instructed to call for assistance.  Pt verbalized understanding.  Karie Kirks, Therapist, sports.

## 2015-06-22 NOTE — Progress Notes (Signed)
ANTICOAGULATION CONSULT NOTE - Initial Consult  Pharmacy Consult for Heparin Indication: chest pain/ACS  Allergies  Allergen Reactions  . Fish Allergy     Throat closes, and hives.   Marland Kitchen Phenergan [Promethazine Hcl] Swelling  . Toradol [Ketorolac Tromethamine] Hives    Patient Measurements: 78 kg  Vital Signs: BP: 110/70 mmHg (08/09 1126) Pulse Rate: 99 (08/09 1126)  Labs: No results for input(s): HGB, HCT, PLT, APTT, LABPROT, INR, HEPARINUNFRC, CREATININE, CKTOTAL, CKMB, TROPONINI in the last 72 hours.  CrCl cannot be calculated (Patient has no serum creatinine result on file.).   Medical History: Past Medical History  Diagnosis Date  . Migraine   . GERD (gastroesophageal reflux disease)   . Lumbago   . Seizures     last seizure nov 2014  . Allergic rhinitis   . Fibromyalgia   . Carpal tunnel syndrome on both sides   . High cholesterol     Assessment: 39 year old with a history of seizures and migraines admitted for chest pain.  Pharmacy asked to begin heparin.  Goal of Therapy:  Heparin level 0.3-0.7 units/ml Monitor platelets by anticoagulation protocol: Yes   Plan:  Heparin 4000 units iv bolus x 1 Heparin drip at 1200 units / hr Heparin level level, CBC 6 hours after heparin starts  Thank you Anette Guarneri, PharmD [phone]  06/22/2015,5:49 PM
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
## 2015-06-22 NOTE — H&P (Signed)
Referring Physician:  [name] is an 39 y.o. female.                       Chief Complaint: Chest pain  HPI: 39 year old female with left sided chest pain as heaviness and left arm numbness. Some nausea. No sweating spell. Occasional shortness of breath. No fever or cough. History of migraine headaches and fibromyalgia. No NTG use.   Past Medical History  Diagnosis Date  . Migraine   . GERD (gastroesophageal reflux disease)   . Lumbago   . Seizures     last seizure nov 2014  . Allergic rhinitis   . Fibromyalgia   . Carpal tunnel syndrome on both sides   . High cholesterol       Past Surgical History  Procedure Laterality Date  . Abdominal hysterectomy    . Tubal ligation    . Foot surgery      bilateral, toe surgery  . Bunionectomy Right 11-07-2013  . Aiken osteotomy Right 11-07-2013    Family History  Problem Relation Age of Onset  . Seizures Mother   . Emphysema Father   . Seizures Maternal Grandmother   . Seizures Sister   . Bipolar disorder Sister    Social History:  reports that she has never smoked. She has never used smokeless tobacco. She reports that she does not drink alcohol or use illicit drugs.  Allergies:  Allergies  Allergen Reactions  . Fish Allergy     Throat closes, and hives.   Marland Kitchen Phenergan [Promethazine Hcl] Swelling  . Toradol [Ketorolac Tromethamine] Hives    Medications Prior to Admission  Medication Sig Dispense Refill  . ammonium lactate (LAC-HYDRIN) 12 % lotion Apply 1 application topically as needed for dry skin. 400 g 0  . aspirin 325 MG tablet Take 325 mg by mouth daily.    . cetirizine (ZYRTEC) 10 MG tablet TAKE 1 TABLET EVERY DAY 90 tablet 2  . cyclobenzaprine (FLEXERIL) 10 MG tablet TAKE 1 TABLET BY MOUTH 3 TIMES A DAY AS NEEDED FOR SPASMS 30 tablet 2  . econazole nitrate 1 % cream Apply topically daily. 85 g 5  . escitalopram (LEXAPRO) 10 MG tablet TAKE 1 TABLET (10 MG TOTAL) BY MOUTH DAILY. 30 tablet 2  . esomeprazole  (NEXIUM) 20 MG capsule Take 1 capsule (20 mg total) by mouth daily. 90 capsule 1  . fluticasone (FLONASE) 50 MCG/ACT nasal spray Place 2 sprays into both nostrils daily. 16 g 0  . fluticasone (VERAMYST) 27.5 MCG/SPRAY nasal spray Place 2 sprays into the nose daily.    Marland Kitchen gabapentin (NEURONTIN) 100 MG capsule Take 1 capsule twice a day 60 capsule 6  . HYDROcodone-acetaminophen (NORCO/VICODIN) 5-325 MG per tablet Take 1-2 tablets by mouth every 4 (four) hours as needed for moderate pain. 30 tablet 0  . ibuprofen (ADVIL,MOTRIN) 800 MG tablet Take 1 tablet (800 mg total) by mouth every 8 (eight) hours as needed. 30 tablet 3  . Linaclotide (LINZESS) 145 MCG CAPS capsule Take 1 capsule (145 mcg total) by mouth daily. 30 capsule 3  . metoCLOPramide (REGLAN) 10 MG tablet Take 1 tablet (10 mg total) by mouth every 6 (six) hours as needed for nausea. 30 tablet 0  . mirtazapine (REMERON) 30 MG tablet Take 1 tablet (30 mg total) by mouth at bedtime. 30 tablet 3  . neomycin-polymyxin-hydrocortisone (CORTISPORIN) otic solution 1-2 drops to the toe after soaking twice daily 10 mL 1  .  olopatadine (PATANOL) 0.1 % ophthalmic solution Place 1 drop into both eyes 2 (two) times daily.    . ondansetron (ZOFRAN) 8 MG tablet Take 8 mg by mouth 3 (three) times daily as needed for nausea or vomiting.    Marland Kitchen oxyCODONE-acetaminophen (PERCOCET) 10-325 MG per tablet Take one tablet by mouth every six hours as needed for pain. 30 tablet 0  . PATADAY 0.2 % SOLN     . polyethylene glycol (MIRALAX / GLYCOLAX) packet MIX CONTENTS OF 1 PACKET WITH 4 TO 8 OUNCES OF LIQUID AND DRINK EVERY DAY 30 packet 0  . rizatriptan (MAXALT-MLT) 10 MG disintegrating tablet Take 1 tablet (10 mg total) by mouth 3 (three) times daily as needed for migraine. May repeat in 2 hours if needed 10 tablet 3  . topiramate (TOPAMAX) 100 MG tablet Take 1 tablet (100 mg total) by mouth 2 (two) times daily. 180 tablet 3  . zolpidem (AMBIEN) 10 MG tablet Take 1 tablet  (10 mg total) by mouth at bedtime. 30 tablet 0    No results found for this or any previous visit (from the past 48 hour(s)). No results found.  Review Of Systems Constitutional: No fevers, chills, or sweats, no generalized fatigue, change in appetite Eyes: No visual changes, double vision, eye pain Ear, nose and throat: No hearing loss, ear pain, nasal congestion, sore throat Cardiovascular: No chest pain, palpitations Respiratory: No shortness of breath at rest or with exertion, wheezes GastrointestinaI: No nausea, vomiting, diarrhea, abdominal pain, fecal incontinence Genitourinary: No dysuria, urinary retention or frequency Musculoskeletal: + neck pain, back pain Integumentary: No rash, pruritus, skin lesions Neurological: as above Psychiatric: + depression, insomnia, anxiety Endocrine: No palpitations, fatigue, diaphoresis, mood swings, change in appetite, change in weight, increased thirst Hematologic/Lymphatic: No anemia, purpura, petechiae. Allergic/Immunologic: no itchy/runny eyes, nasal congestion, recent allergic reactions, rashes  Blood pressure 118/70, pulse 92, temperature 98.8 F (37.1 C), temperature source Oral, resp. rate 18, height 5\' 9"  (1.753 m), weight 76.431 kg (168 lb 8 oz). General: No acute distress. Well built and nourished. Head: Normocephalic/atraumatic Neck: supple, no paraspinal tenderness, full range of motion Heart: Regular rate and rhythm. II/VI systolic murmur Lungs: Clear to auscultation bilaterally. No chest wall tenderness Back: No paraspinal tenderness Skin/Extremities: No rash, no edema.  Neurological Exam: alert and oriented to person, place, and time. Moves all 4 extremities.  Assessment/Plan Chest pain at rest Migraine headaches Seizures Fibromyalgia Dyslipidemia  Admit/Nuclear stress test in AM.  Birdie Riddle, MD  06/22/2015, 6:17 PM
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
## 2015-06-23 ENCOUNTER — Inpatient Hospital Stay (HOSPITAL_COMMUNITY): Payer: Medicare Other

## 2015-06-23 ENCOUNTER — Encounter (HOSPITAL_COMMUNITY): Payer: Self-pay | Admitting: General Practice

## 2015-06-23 LAB — LIPID PANEL
CHOLESTEROL: 275 mg/dL — AB (ref 0–200)
HDL: 58 mg/dL (ref >40)
LDL Cholesterol: 190 mg/dL — ABNORMAL HIGH (ref 0–99)
TRIGLYCERIDES: 133 mg/dL (ref <150)
Total CHOL/HDL Ratio: 4.7 RATIO
VLDL: 27 mg/dL (ref 0–40)

## 2015-06-23 LAB — CBC
HCT: 33.7 % — ABNORMAL LOW (ref 36.0–46.0)
HEMATOCRIT: 35.1 % — AB (ref 36.0–46.0)
HEMOGLOBIN: 11.4 g/dL — AB (ref 12.0–15.0)
Hemoglobin: 10.9 g/dL — ABNORMAL LOW (ref 12.0–15.0)
MCH: 30.1 pg (ref 26.0–34.0)
MCH: 30.6 pg (ref 26.0–34.0)
MCHC: 32.3 g/dL (ref 30.0–36.0)
MCHC: 32.5 g/dL (ref 30.0–36.0)
MCV: 93.1 fL (ref 78.0–100.0)
MCV: 94.1 fL (ref 78.0–100.0)
PLATELETS: 188 10*3/uL (ref 150–400)
Platelets: 196 10*3/uL (ref 150–400)
RBC: 3.62 MIL/uL — ABNORMAL LOW (ref 3.87–5.11)
RBC: 3.73 MIL/uL — AB (ref 3.87–5.11)
RDW: 12.7 % (ref 11.5–15.5)
RDW: 12.9 % (ref 11.5–15.5)
WBC: 7.2 10*3/uL (ref 4.0–10.5)
WBC: 7.3 10*3/uL (ref 4.0–10.5)

## 2015-06-23 LAB — HEPARIN LEVEL (UNFRACTIONATED)
HEPARIN UNFRACTIONATED: 0.7 [IU]/mL (ref 0.30–0.70)
Heparin Unfractionated: 0.82 IU/mL — ABNORMAL HIGH (ref 0.30–0.70)

## 2015-06-23 LAB — BASIC METABOLIC PANEL
Anion gap: 9 (ref 5–15)
BUN: 11 mg/dL (ref 6–20)
CALCIUM: 9.2 mg/dL (ref 8.9–10.3)
CO2: 27 mmol/L (ref 22–32)
Chloride: 104 mmol/L (ref 101–111)
Creatinine, Ser: 1.02 mg/dL — ABNORMAL HIGH (ref 0.44–1.00)
GFR calc Af Amer: >60 mL/min (ref >60)
GFR calc non Af Amer: >60 mL/min (ref >60)
GLUCOSE: 91 mg/dL (ref 65–99)
POTASSIUM: 4.2 mmol/L (ref 3.5–5.1)
Sodium: 140 mmol/L (ref 135–145)

## 2015-06-23 LAB — TROPONIN I: Troponin I: <0.03 ng/mL (ref <0.031)

## 2015-06-23 MED ORDER — LORATADINE 10 MG PO TABS
10.0000 mg | ORAL_TABLET | Freq: Every day | ORAL | Status: DC
  Administered 2015-06-23: 10 mg via ORAL
  Filled 2015-06-23 (×2): qty 1

## 2015-06-23 MED ORDER — REGADENOSON 0.4 MG/5ML IV SOLN
0.4000 mg | Freq: Once | INTRAVENOUS | Status: AC
  Administered 2015-06-23: 0.4 mg via INTRAVENOUS
  Filled 2015-06-23: qty 5

## 2015-06-23 MED ORDER — TECHNETIUM TC 99M SESTAMIBI GENERIC - CARDIOLITE
30.0000 | Freq: Once | INTRAVENOUS | Status: AC | PRN
  Administered 2015-06-23: 30 via INTRAVENOUS

## 2015-06-23 MED ORDER — REGADENOSON 0.4 MG/5ML IV SOLN
INTRAVENOUS | Status: AC
  Administered 2015-06-23: 0.4 mg via INTRAVENOUS
  Filled 2015-06-23: qty 5

## 2015-06-23 MED ORDER — TECHNETIUM TC 99M SESTAMIBI GENERIC - CARDIOLITE
10.0000 | Freq: Once | INTRAVENOUS | Status: AC | PRN
  Administered 2015-06-23: 10 via INTRAVENOUS

## 2015-06-23 NOTE — Progress Notes (Addendum)
ANTICOAGULATION CONSULT NOTE - Follow-up Consult  Pharmacy Consult for Heparin Indication: chest pain/ACS  Allergies  Allergen Reactions  . Fish Allergy Anaphylaxis, Hives and Swelling    Throat closes  . Phenergan [Promethazine Hcl] Swelling  . Toradol [Ketorolac Tromethamine] Hives    Patient Measurements: Ht 69 in  Wt: 76 kg  Vital Signs: Temp: 97.6 F (36.4 C) (08/10 0212) Temp Source: Oral (08/10 0212) BP: 122/75 mmHg (08/10 0212) Pulse Rate: 89 (08/10 0212)  Labs:  Recent Labs  06/22/15 1934 06/22/15 2330 06/23/15 0325  HGB  --   --  10.9*  HCT  --   --  33.7*  PLT  --   --  188  LABPROT 12.2  --   --   INR 0.89  --   --   HEPARINUNFRC  --   --  0.82*  CREATININE 0.97  --   --   TROPONINI <0.03 <0.03  --     Estimated Creatinine Clearance: 83.8 mL/min (by C-G formula based on Cr of 0.97).  Assessment: 39 year old on heparin for r/o ACS. Heparin level supratherapeutic (0.82) on 1200 units/hr. No bleeding noted. Plan for stress test today.  Goal of Therapy:  Heparin level 0.3-0.7 units/ml Monitor platelets by anticoagulation protocol: Yes   Plan:  Decrease heparin drip to 1050 units / hr Will f/u 6 hour heparin level  Sherlon Handing, PharmD, BCPS Clinical pharmacist, pager [phone]' 06/23/2015,3:58 AM  ___________________________________________________ ADDENDUM: HL on upper limit of therapeutic range at 0.7 after rate decrease this morning, down from 0.82.  CBC stable, no bleeding noted.  Goal of Therapy:  Heparin level 0.3-0.7 units/ml Monitor platelets by anticoagulation protocol: Yes   Plan:  Decrease heparin drip to 1000 units/hr Will f/u 6 hour heparin level Monitor daily HL, CBC, s/sx of bleeding  Drucie Opitz, PharmD Clinical Pharmacist Pager: [phone] 06/23/2015 1:36 PM

## 2015-06-23 NOTE — Progress Notes (Signed)
Pt being dc to home, dc instructions given to pt, pt verbalized understanding, pt stable

## 2015-06-23 NOTE — Progress Notes (Signed)
Pt returns from having stress test, and is agitated bec she has not anything to eat.  I called nutrition and they stated that breakfast orders stopped at 1000. I offered pt ginger ale and graham crackers prior to receiving lunch tray.  Pt stated that she would prob get nauseous and requested Zofran, which I gave to her.  She also c/o headache, so I gave her Oxycodone 5mg .  When I reassessed her pain, she was asleep.

## 2015-06-23 NOTE — Progress Notes (Signed)
Pt continues to refuse bed alarm.  

## 2015-06-23 NOTE — Discharge Summary (Signed)
Physician Discharge Summary  Patient ID: Molly [name] MRN: [id number] DOB/AGE: 22-Dec-1975 39 y.o.  Admit date: 06/22/2015 Discharge date: 06/23/2015  Admission Diagnoses: Chest pain at rest Migraine headaches Seizures Fibromyalgia Dyslipidemia  Discharge Diagnoses:  Principal Problem:   Chest pain at rest Active Problems:   Convulsions/seizures   Migraine without aura   Bipolar disorder   Fibromyalgia   Dyslipidemia  Discharged Condition: fair  Hospital Course: 39 year old female with left sided chest pain as heaviness and left arm numbness. Some nausea. No sweating spell. Occasional shortness of breath. No fever or cough. Past history of migraine headaches and fibromyalgia. She underwent nuclear stress test which did not show reversible ischemia. She will continue her current medications. She was advised to resume diet and activity and see me in 1 month.  Consults: cardiology  Significant Diagnostic Studies: labs: Near normal CBC except Hgb of 10.9. BMET and Troponin-I normal.  EKG-Normal.  Nuclear stress test : No reversible ischemia. EF 68 %.  Treatments: analgesia: Vicodin  Discharge Exam: Blood pressure 134/100, pulse 94, temperature 98 F (36.7 C), temperature source Oral, resp. rate 18, height 5\' 9"  (1.753 m), weight 76.34 kg (168 lb 4.8 oz), SpO2 100 %. General: No acute distress. Well built and nourished. Head: Normocephalic/atraumatic. Brown eyes, conj-pink. Neck: supple, no paraspinal tenderness, full range of motion Heart: Regular rate and rhythm. II/VI systolic murmur Lungs: Clear to auscultation bilaterally. No chest wall tenderness Back: No paraspinal tenderness Skin/Extremities: No rash, no edema.  Neurological Exam: alert and oriented to person, place, and time. Moves all 4 extremities  Disposition: 01-Home or Self Care     Medication List    STOP taking these medications        mirtazapine 30 MG tablet  Commonly known as:  REMERON       TAKE these medications        ammonium lactate 12 % lotion  Commonly known as:  LAC-HYDRIN  Apply 1 application topically as needed for dry skin.     aspirin EC 81 MG tablet  Take 81 mg by mouth daily.     cetirizine 10 MG tablet  Commonly known as:  ZYRTEC  TAKE 1 TABLET EVERY DAY     cyclobenzaprine 10 MG tablet  Commonly known as:  FLEXERIL  TAKE 1 TABLET BY MOUTH 3 TIMES A DAY AS NEEDED FOR SPASMS     econazole nitrate 1 % cream  Apply topically daily.     escitalopram 10 MG tablet  Commonly known as:  LEXAPRO  TAKE 1 TABLET (10 MG TOTAL) BY MOUTH DAILY.     esomeprazole 20 MG capsule  Commonly known as:  NEXIUM  Take 1 capsule (20 mg total) by mouth daily.     fluticasone 50 MCG/ACT nasal spray  Commonly known as:  FLONASE  Place 2 sprays into both nostrils daily.     gabapentin 100 MG capsule  Commonly known as:  NEURONTIN  Take 1 capsule twice a day     HAIR SKIN & NAILS GUMMIES PO  Take 1 tablet by mouth daily.     HYDROcodone-acetaminophen 5-325 MG per tablet  Commonly known as:  NORCO/VICODIN  Take 1-2 tablets by mouth every 4 (four) hours as needed for moderate pain.     ibuprofen 800 MG tablet  Commonly known as:  ADVIL,MOTRIN  Take 1 tablet (800 mg total) by mouth every 8 (eight) hours as needed.     Linaclotide 145 MCG Caps capsule  Commonly  known as:  LINZESS  Take 1 capsule (145 mcg total) by mouth daily.     metoCLOPramide 10 MG tablet  Commonly known as:  REGLAN  Take 1 tablet (10 mg total) by mouth every 6 (six) hours as needed for nausea.     neomycin-polymyxin-hydrocortisone otic solution  Commonly known as:  CORTISPORIN  1-2 drops to the toe after soaking twice daily     ondansetron 8 MG tablet  Commonly known as:  ZOFRAN  Take 8 mg by mouth 3 (three) times daily as needed for nausea or vomiting.     oxyCODONE-acetaminophen 10-325 MG per tablet  Commonly known as:  PERCOCET  Take one tablet by mouth every six hours as needed  for pain.     PATADAY 0.2 % Soln  Generic drug:  Olopatadine HCl  Place 1 drop into both eyes daily.     polyethylene glycol packet  Commonly known as:  MIRALAX / GLYCOLAX  MIX CONTENTS OF 1 PACKET WITH 4 TO 8 OUNCES OF LIQUID AND DRINK EVERY DAY     rizatriptan 10 MG disintegrating tablet  Commonly known as:  MAXALT-MLT  Take 1 tablet (10 mg total) by mouth 3 (three) times daily as needed for migraine. May repeat in 2 hours if needed     topiramate 100 MG tablet  Commonly known as:  TOPAMAX  Take 1 tablet (100 mg total) by mouth 2 (two) times daily.     Vitamin D3 5000 UNITS Caps  Take 5,000 Units by mouth daily.     zolpidem 10 MG tablet  Commonly known as:  AMBIEN  Take 1 tablet (10 mg total) by mouth at bedtime.           Follow-up Information    Follow up with Kennyth Arnold, FNP. Schedule an appointment as soon as possible for a visit in 1 month.   Specialty:  Family Medicine   Contact information:   [place] [address] [phone]       Follow up with [facility], MD. Schedule an appointment as soon as possible for a visit in 1 month.   Specialty:  Cardiology   Contact information:   [place] [address] [phone]       Signed: Birdie Riddle 06/23/2015, 5:58 PM

## 2015-06-23 NOTE — Progress Notes (Signed)
While admin AM meds, pt stated that she did not want the ordered gabapentin, therefore, I charted it "not given," and removed capsule from her med cup, and discarded the pill. Addtl, pt stated that she had not received her Zyrtec 10mg , therefore, I stated that I would contact attending physician r/t this medication.  I spoke with Dr.Kadakia, and he gave a telephone order for Claritin 10mg .  [facility] pharmacy does not have the name brand Zyrtec, but does have Claritin, which has the same active ingredient of loratadine 10mg . I explained all to pt. She did state that she had not had a dose last night. I asked Dr. Doylene Canard about giving her an additional dose, and he said no.  Therefore, I gave one dose of Claritin to pt, and explained all to her.  Prior to me leaving room, I asked pt if she had additional questions, and if she needed anything, and she replied, "no."

## 2015-06-23 NOTE — Progress Notes (Addendum)
Rounded on pt, and noticed that her heparin gtts was not running.  I asked the pt about this and she stated that she had turned off herself, and disconnected from her peripheral IV.  I asked her why she had disconnected/powered off her heparin gtts.  She responded, "I called the nurses station and requested that someone come check and no one ever came, so I turned it off myself."  I was unaware that her pump was beeping, and was not informed via nurses station, nor any other notification.  I educated the pt, stating that if no one responds, please call again, but it is unsafe for her to program the IV pump herself, and disconnecting her IV totally could cause infection.

## 2015-06-23 NOTE — Progress Notes (Signed)
Pt C/o itching this AM, pt states sometimes she itches after taking pain medicine. No rash or swelling noted.  Pt with lotion to use at bedside. Pt educated to notify RN if itching gets worse or if rash noted.

## 2015-06-23 NOTE — Progress Notes (Signed)
Pt states she received a new prescription for neurontin today by her MD, pt states she is unsure the dose and that this is a new medicine for her. Medication explained to patient. Pt states she does not want to take medicine until speaking to MD in the AM.

## 2015-06-24 ENCOUNTER — Telehealth: Payer: Self-pay | Admitting: *Deleted

## 2015-06-24 NOTE — Telephone Encounter (Signed)
Transition Care Management Follow-up Telephone Call  How have you been since you were released from the hospital? Doing okay   Do you understand why you were in the hospital? yes   Do you understand the discharge instrcutions? yes  Items Reviewed:  Medications reviewed: yes  Allergies reviewed: yes  Dietary changes reviewed: yes  Referrals reviewed: yes   Functional Questionnaire:   Activities of Daily Living (ADLs):   She states they are independent in the following: ambulation, bathing and hygiene, feeding, continence, grooming, toileting and dressing States they require assistance with the following: ambulation, bathing and hygiene, feeding, continence, grooming, toileting and dressing   Any transportation issues/concerns?: no   Any patient concerns? no   Confirmed importance and date/time of follow-up visits scheduled: yes   Confirmed with patient if condition begins to worsen call PCP or go to the ER.  Patient was given the Call-a-Nurse line [phone]: yes Patient was discharged 06/22/15 Patient was discharged to home Patient has an appointment on 06/30/15

## 2015-06-30 ENCOUNTER — Encounter: Payer: Self-pay | Admitting: Family

## 2015-06-30 ENCOUNTER — Ambulatory Visit (INDEPENDENT_AMBULATORY_CARE_PROVIDER_SITE_OTHER): Payer: Medicare Other | Admitting: Family

## 2015-06-30 VITALS — BP 128/84 | HR 86 | Temp 98.8°F | Ht 69.0 in | Wt 169.2 lb

## 2015-06-30 DIAGNOSIS — M797 Fibromyalgia: Secondary | ICD-10-CM | POA: Diagnosis not present

## 2015-06-30 DIAGNOSIS — M549 Dorsalgia, unspecified: Secondary | ICD-10-CM | POA: Diagnosis not present

## 2015-06-30 DIAGNOSIS — G4700 Insomnia, unspecified: Secondary | ICD-10-CM | POA: Diagnosis not present

## 2015-06-30 DIAGNOSIS — E7800 Pure hypercholesterolemia, unspecified: Secondary | ICD-10-CM | POA: Insufficient documentation

## 2015-06-30 DIAGNOSIS — G8929 Other chronic pain: Secondary | ICD-10-CM

## 2015-06-30 MED ORDER — OXYCODONE-ACETAMINOPHEN 10-325 MG PO TABS: ORAL_TABLET | ORAL | Status: DC

## 2015-06-30 NOTE — Patient Instructions (Signed)

## 2015-07-11 NOTE — Progress Notes (Signed)
Subjective:    Patient ID: Molly [name], female    DOB: June 29, 1976, 39 y.o.   MRN: [id number]  HPI  39 year old AAF, nonsmoker, with a history of seizure disorder, bipolar disorder, Fibromyalgia, GERD, hypercholesterolemia. She was released from the hospital recently after a suspected blockage in her. Pulse was not palpable, she was started on a Heparin drip that she reports having an allergic reaction to. She reports unhooking her own IV in the hospital because the medication (Heparin) was causing itching. Continues to complain of inability to sleep. She was under the care of psychiatry but reports she is not going back to see the psychiatrist. She continues to have chronic back pain and generalized fibromyalgia pain. Not currently taking any medication.   Review of Systems  Constitutional: Negative.   HENT: Negative.   Respiratory: Negative.   Cardiovascular: Negative.   Gastrointestinal: Negative.   Endocrine: Negative.   Genitourinary: Negative.   Musculoskeletal: Positive for myalgias, back pain and arthralgias.  Skin: Negative.   Allergic/Immunologic: Negative.   Neurological: Negative.   Hematological: Negative.   Psychiatric/Behavioral: Positive for sleep disturbance. The patient is nervous/anxious.    Past Medical History  Diagnosis Date  . Migraine   . GERD (gastroesophageal reflux disease)   . Lumbago   . Seizures     last seizure nov 2014  . Allergic rhinitis   . Fibromyalgia   . Carpal tunnel syndrome on both sides   . High cholesterol     Social History   Social History  . Marital Status: Legally Separated    Spouse Name: N/A  . Number of Children: 2  . Years of Education: 12+   Occupational History  . unemployed    Social History Main Topics  . Smoking status: Never Smoker   . Smokeless tobacco: Never Used  . Alcohol Use: No  . Drug Use: No  . Sexual Activity: Not on file   Other Topics Concern  . Not on file   Social History Narrative   Patient lives at home.    Patient has 2 children.    Patient has a college education.    Patient is right handed.     Past Surgical History  Procedure Laterality Date  . Abdominal hysterectomy    . Tubal ligation    . Foot surgery      bilateral, toe surgery  . Bunionectomy Right 11-07-2013  . Aiken osteotomy Right 11-07-2013    Family History  Problem Relation Age of Onset  . Seizures Mother   . Emphysema Father   . Seizures Maternal Grandmother   . Seizures Sister   . Bipolar disorder Sister     Allergies  Allergen Reactions  . Fish Allergy Anaphylaxis, Hives and Swelling    Throat closes  . Heparin Itching  . Phenergan [Promethazine Hcl] Swelling  . Toradol [Ketorolac Tromethamine] Hives    Current Outpatient Prescriptions on File Prior to Visit  Medication Sig Dispense Refill  . ammonium lactate (LAC-HYDRIN) 12 % lotion Apply 1 application topically as needed for dry skin. (Patient taking differently: Apply 1 application topically daily. ) 400 g 0  . aspirin EC 81 MG tablet Take 81 mg by mouth daily.    . Biotin w/ Vitamins C & E (HAIR SKIN & NAILS GUMMIES PO) Take 1 tablet by mouth daily.    . cetirizine (ZYRTEC) 10 MG tablet TAKE 1 TABLET EVERY DAY (Patient taking differently: TAKE 10 mg every evening) 90 tablet 2  .  Cholecalciferol (VITAMIN D3) 5000 UNITS CAPS Take 5,000 Units by mouth daily.    . cyclobenzaprine (FLEXERIL) 10 MG tablet TAKE 1 TABLET BY MOUTH 3 TIMES A DAY AS NEEDED FOR SPASMS 30 tablet 2  . econazole nitrate 1 % cream Apply topically daily. (Patient taking differently: Apply 1 application topically daily. ) 85 g 5  . escitalopram (LEXAPRO) 10 MG tablet TAKE 1 TABLET (10 MG TOTAL) BY MOUTH DAILY. (Patient taking differently: Take 10 mg by mouth at bedtime. ) 30 tablet 2  . esomeprazole (NEXIUM) 20 MG capsule Take 1 capsule (20 mg total) by mouth daily. 90 capsule 1  . fluticasone (FLONASE) 50 MCG/ACT nasal spray Place 2 sprays into both nostrils  daily. 16 g 0  . gabapentin (NEURONTIN) 100 MG capsule Take 1 capsule twice a day (Patient taking differently: Take 100 mg by mouth 2 (two) times daily. ) 60 capsule 6  . ibuprofen (ADVIL,MOTRIN) 800 MG tablet Take 1 tablet (800 mg total) by mouth every 8 (eight) hours as needed. (Patient taking differently: Take 800 mg by mouth every 8 (eight) hours as needed for moderate pain. ) 30 tablet 3  . Linaclotide (LINZESS) 145 MCG CAPS capsule Take 1 capsule (145 mcg total) by mouth daily. 30 capsule 3  . metoCLOPramide (REGLAN) 10 MG tablet Take 1 tablet (10 mg total) by mouth every 6 (six) hours as needed for nausea. 30 tablet 0  . neomycin-polymyxin-hydrocortisone (CORTISPORIN) otic solution 1-2 drops to the toe after soaking twice daily (Patient taking differently: Apply 1-2 drops topically 2 (two) times daily as needed (for soaking toe). ) 10 mL 1  . ondansetron (ZOFRAN) 8 MG tablet Take 8 mg by mouth 3 (three) times daily as needed for nausea or vomiting.    Marland Kitchen PATADAY 0.2 % SOLN Place 1 drop into both eyes daily.     . polyethylene glycol (MIRALAX / GLYCOLAX) packet MIX CONTENTS OF 1 PACKET WITH 4 TO 8 OUNCES OF LIQUID AND DRINK EVERY DAY (Patient taking differently: MIX CONTENTS OF 1 PACKET WITH 4 TO 8 OUNCES OF LIQUID AND DRINK EVERY DAY as needed) 30 packet 0  . rizatriptan (MAXALT-MLT) 10 MG disintegrating tablet Take 1 tablet (10 mg total) by mouth 3 (three) times daily as needed for migraine. May repeat in 2 hours if needed 10 tablet 3  . topiramate (TOPAMAX) 100 MG tablet Take 1 tablet (100 mg total) by mouth 2 (two) times daily. 180 tablet 3  . zolpidem (AMBIEN) 10 MG tablet Take 1 tablet (10 mg total) by mouth at bedtime. 30 tablet 0   No current facility-administered medications on file prior to visit.    BP 128/84 mmHg  Pulse 86  Temp(Src) 98.8 F (37.1 C)  Ht 5\' 9"  (1.753 m)  Wt 169 lb 3.2 oz (76.749 kg)  BMI 24.98 kg/m2chart    Objective:   Physical Exam  Constitutional: She is  oriented to person, place, and time. She appears well-developed and well-nourished.  HENT:  Right Ear: External ear normal.  Left Ear: External ear normal.  Nose: Nose normal.  Mouth/Throat: Oropharynx is clear and moist.  Neck: Normal range of motion. Neck supple. No thyromegaly present.  Cardiovascular: Normal rate, regular rhythm and normal heart sounds.   Pulmonary/Chest: Effort normal and breath sounds normal.  Abdominal: Soft. Bowel sounds are normal.  Musculoskeletal: Normal range of motion.  Neurological: She is alert and oriented to person, place, and time.  Skin: Skin is warm and dry.  Assessment & Plan:  Dejanique was seen today for hospitalization follow-up.  Diagnoses and all orders for this visit:  Pure hypercholesterolemia  Fibromyalgia  Chronic back pain -     Ambulatory referral to Pain Clinic  Insomnia  Other orders -     oxyCODONE-acetaminophen (PERCOCET) 10-325 MG per tablet; Take one tablet by mouth every six hours as needed for pain.   Call the office with any questions or concerns. See psychiatry. Refer to pain management. Temporary pain medication provided today.

## 2015-07-15 ENCOUNTER — Ambulatory Visit (INDEPENDENT_AMBULATORY_CARE_PROVIDER_SITE_OTHER): Payer: Medicare Other

## 2015-07-15 ENCOUNTER — Encounter: Payer: Self-pay | Admitting: Podiatry

## 2015-07-15 ENCOUNTER — Ambulatory Visit (INDEPENDENT_AMBULATORY_CARE_PROVIDER_SITE_OTHER): Payer: Medicare Other | Admitting: Podiatry

## 2015-07-15 VITALS — BP 116/77 | HR 86 | Resp 16

## 2015-07-15 DIAGNOSIS — G5792 Unspecified mononeuropathy of left lower limb: Secondary | ICD-10-CM

## 2015-07-15 DIAGNOSIS — M7750 Other enthesopathy of unspecified foot: Secondary | ICD-10-CM

## 2015-07-15 DIAGNOSIS — M6588 Other synovitis and tenosynovitis, other site: Secondary | ICD-10-CM

## 2015-07-15 MED ORDER — DICLOFENAC SODIUM 75 MG PO TBEC: 75.0000 mg | DELAYED_RELEASE_TABLET | Freq: Two times a day (BID) | ORAL | Status: DC

## 2015-07-16 NOTE — Progress Notes (Signed)
She presents today with a chief complaint of a one-day duration of pain to her left plantar arch. She states that she has recently increased her activity level secondary to her doctor encouraging her to get some exercise due to an increase in body weight. She denies trauma to the foot other than increase in activity. She states that the pain begins at the base of the hallux and extends to the proximal medial arch. She's done nothing to try to alleviate the symptoms. She presents today in tennis shoes.  Objective: Her vital signs are stable she is alert and oriented 3 demonstrates no acute distress and in good spirits. Pulses are strongly palpable left. Neurologic sensorium is intact per Semmes-Weinstein monofilament. Deep tendon reflexes are intact. Muscle strength is normal. She has pain on palpation which appears to be sharp and out of proportion. The pain appears to be primarily located in the knot of [place] on the plantar foot. This appears to be a flexor hallucis longus tendinitis. Radiographs demonstrate a well-healed Akin osteotomy. No signs of soft tissue edema or foreign body on radiograph. Mild soft tissue increase in density of the plantar fascia on lateral view. Cutaneous evaluation demonstrates supple well-hydrated cutis sharply incurvated nail margins to the left hallux. No signs of infection.  Assessment: FHL tendinitis. Ingrown nail tibial and fibular border hallux left.  Plan: Discussed etiology pathology conservative versus surgical therapies. I encouraged her to return to her [place] 1 week. We will start her on diclofenac 75 mg 1 tablet twice daily. She will apply ice to the medial longitudinal arch several times throughout the day. I will follow-up with her in the next few weeks.  Roselind Messier DPM

## 2015-07-21 DIAGNOSIS — I10 Essential (primary) hypertension: Secondary | ICD-10-CM | POA: Diagnosis not present

## 2015-07-21 DIAGNOSIS — D638 Anemia in other chronic diseases classified elsewhere: Secondary | ICD-10-CM | POA: Diagnosis not present

## 2015-07-21 DIAGNOSIS — R072 Precordial pain: Secondary | ICD-10-CM | POA: Diagnosis not present

## 2015-07-21 DIAGNOSIS — M797 Fibromyalgia: Secondary | ICD-10-CM | POA: Diagnosis not present

## 2015-07-26 ENCOUNTER — Other Ambulatory Visit: Payer: Self-pay | Admitting: Gastroenterology

## 2015-07-29 DIAGNOSIS — D485 Neoplasm of uncertain behavior of skin: Secondary | ICD-10-CM | POA: Diagnosis not present

## 2015-07-29 DIAGNOSIS — L910 Hypertrophic scar: Secondary | ICD-10-CM | POA: Diagnosis not present

## 2015-07-29 DIAGNOSIS — L700 Acne vulgaris: Secondary | ICD-10-CM | POA: Diagnosis not present

## 2015-08-05 ENCOUNTER — Ambulatory Visit (INDEPENDENT_AMBULATORY_CARE_PROVIDER_SITE_OTHER): Payer: Medicare Other | Admitting: Podiatry

## 2015-08-05 ENCOUNTER — Encounter: Payer: Self-pay | Admitting: Podiatry

## 2015-08-05 VITALS — BP 132/96 | HR 99 | Resp 12

## 2015-08-05 DIAGNOSIS — M7750 Other enthesopathy of unspecified foot: Secondary | ICD-10-CM

## 2015-08-05 DIAGNOSIS — M6588 Other synovitis and tenosynovitis, other site: Secondary | ICD-10-CM

## 2015-08-05 NOTE — Progress Notes (Signed)
She presents today for follow-up of her flexor hallucis longus tendinitis left foot. She states that she still has some of the pain particularly with toe off. She states that she recently saw a dermatologist in [place] who removed her keloid from the dorsal aspect of her foot simply by shaving it off. She states that it is still healing in. She requested that I look at it and I explained to her that this is something she is to follow-up with her surgeon of record.  Objective: Vital signs are stable. Alert and oriented 3 she still has tenderness on palpation of the plantar medial longitudinal arch along the long flexor tendon. No erythema or edema cellulitis drainage or odor. Dressing was intact to the dorsal aspect of the hallux left.  Assessment: FHL tendinitis left.  Plan: I placed her in car and graphite insoles and I will follow-up with her proximal 6 weeks.

## 2015-08-18 ENCOUNTER — Other Ambulatory Visit: Payer: Self-pay | Admitting: Family

## 2015-08-24 DIAGNOSIS — D638 Anemia in other chronic diseases classified elsewhere: Secondary | ICD-10-CM | POA: Diagnosis not present

## 2015-08-24 DIAGNOSIS — I10 Essential (primary) hypertension: Secondary | ICD-10-CM | POA: Diagnosis not present

## 2015-08-24 DIAGNOSIS — R072 Precordial pain: Secondary | ICD-10-CM | POA: Diagnosis not present

## 2015-08-24 DIAGNOSIS — M797 Fibromyalgia: Secondary | ICD-10-CM | POA: Diagnosis not present

## 2015-09-02 ENCOUNTER — Encounter: Payer: Self-pay | Admitting: Podiatry

## 2015-09-02 ENCOUNTER — Ambulatory Visit (INDEPENDENT_AMBULATORY_CARE_PROVIDER_SITE_OTHER): Payer: Medicare Other | Admitting: Podiatry

## 2015-09-02 VITALS — BP 123/72 | HR 68 | Resp 16

## 2015-09-02 DIAGNOSIS — M6588 Other synovitis and tenosynovitis, other site: Secondary | ICD-10-CM | POA: Diagnosis not present

## 2015-09-02 DIAGNOSIS — M7750 Other enthesopathy of unspecified foot: Secondary | ICD-10-CM

## 2015-09-02 NOTE — Progress Notes (Signed)
She presents today for a follow-up of her tendinitis. She states that she was unable to wear her inserts. She states that it made her feet go numb. She is complaining today of a painful ingrown toenail to the medial border second digit of the left foot. She denies fever chills nausea vomiting muscle aches and pains.  Objective: Vital signs are stable she is alert and oriented 3 she still has some tenderness on range of motion of the forefoot bilaterally. Mild ingrown nail tibial border second digit left. Does not demonstrate any type of infection at this point.  Assessment: Ingrown nail second digit left foot. Capsulitis forefoot bilateral.  Plan: Debridement of nail today will follow up with her in 4 weeks.

## 2015-09-14 ENCOUNTER — Telehealth: Payer: Self-pay | Admitting: Family

## 2015-09-14 ENCOUNTER — Ambulatory Visit (INDEPENDENT_AMBULATORY_CARE_PROVIDER_SITE_OTHER): Payer: Medicare Other | Admitting: Family Medicine

## 2015-09-14 ENCOUNTER — Encounter: Payer: Self-pay | Admitting: Family Medicine

## 2015-09-14 VITALS — BP 125/85 | HR 82 | Temp 98.7°F | Ht 69.0 in | Wt 159.0 lb

## 2015-09-14 DIAGNOSIS — T7840XA Allergy, unspecified, initial encounter: Secondary | ICD-10-CM | POA: Diagnosis not present

## 2015-09-14 MED ORDER — EPINEPHRINE 0.3 MG/0.3ML IJ SOAJ: 0.3000 mg | Freq: Once | INTRAMUSCULAR | Status: DC

## 2015-09-14 NOTE — Telephone Encounter (Signed)
Patient Name: Molly [name]  DOB: 1976/09/09    Initial Comment Caller states she has fish allergy, throat feels swollen and like something stuck   Nurse Assessment  Nurse: Leilani Merl, RN, Heather Date/Time (Eastern Time): 09/14/2015 10:40:27 AM  Confirm and document reason for call. If symptomatic, describe symptoms. ---Caller states that she ate something yesterday and since she feels like her throat is swelling, her husband looked in her throat and said that one side of it was white and red. She has been taking benadryl and she is not having any diff breathing or swallowing.  Has the patient traveled out of the country within the last 30 days? ---Not Applicable  Does the patient have any new or worsening symptoms? ---Yes  Will a triage be completed? ---Yes  Related visit to physician within the last 2 weeks? ---No  Does the PT have any chronic conditions? (i.e. diabetes, asthma, etc.) ---Yes  List chronic conditions. ---see MR  Did the patient indicate they were pregnant? ---No     Guidelines    Guideline Title Affirmed Question Affirmed Notes  Sore Throat Patient sounds very sick or weak to the triager    Final Disposition User   Go to ED Now (or PCP triage) Leilani Merl, RN, Nira Conn    Comments  Appt made for 2 pm today with Dr. Sarajane Jews.  office is open, appt scheduled vs sending patient to the ED   Referrals  REFERRED TO PCP OFFICE   Disagree/Comply: Comply

## 2015-09-14 NOTE — Progress Notes (Signed)
   Subjective:    Patient ID: Molly [name], female    DOB: 1975-12-19, 39 y.o.   MRN: [id number]  HPI Here for a possible allergic reaction to shrimp. She has hives reactions when she eats fish, and she can usually eat shrimp without incident at home. However she ate some shrimp at a restaurant yesterday afternoon about 4 pm and she almost immediately began to feel a swelling in the throat. She describes it a "a ball of cotton" in her throat. It is uncomfortable for her to swallow but she does not have a sore throat per se. No PND or coughing. She has been taking Benadryl every 4 hours since then and she feels better. No SOB or wheezing or hives.    Review of Systems  Constitutional: Negative.   HENT: Negative for congestion, mouth sores and nosebleeds.   Eyes: Negative.   Respiratory: Negative.   Cardiovascular: Negative.   Skin: Negative.   Neurological: Negative.        Objective:   Physical Exam  Constitutional: She is oriented to person, place, and time. She appears well-developed and well-nourished. No distress.  HENT:  Right Ear: External ear normal.  Left Ear: External ear normal.  Nose: Nose normal.  Mouth/Throat: Oropharynx is clear and moist.  No swelling or erythema seen   Eyes: Conjunctivae and EOM are normal. Pupils are equal, round, and reactive to light.  Neck: Neck supple. No tracheal deviation present. No thyromegaly present.  Cardiovascular: Normal rate, regular rhythm, normal heart sounds and intact distal pulses.   Pulmonary/Chest: Effort normal and breath sounds normal. No stridor.  Lymphadenopathy:    She has no cervical adenopathy.  Neurological: She is alert and oriented to person, place, and time.          Assessment & Plan:  Probable mild allergic reaction to the shrimp. I offered to give her a steroid shot but she declined. She will continue to use Benadryl and drink fluids. Recheck prn. I refilled her EpiPen.

## 2015-09-14 NOTE — Progress Notes (Signed)
Pre visit review using our clinic review tool, if applicable. No additional management support is needed unless otherwise documented below in the visit note. 

## 2015-09-14 NOTE — Telephone Encounter (Signed)
Spoke with Dr. Sarajane Jews and he verbally agreed patient is stable to wait until appointment to be seen

## 2015-09-21 DIAGNOSIS — D638 Anemia in other chronic diseases classified elsewhere: Secondary | ICD-10-CM | POA: Diagnosis not present

## 2015-09-21 DIAGNOSIS — M797 Fibromyalgia: Secondary | ICD-10-CM | POA: Diagnosis not present

## 2015-09-21 DIAGNOSIS — E559 Vitamin D deficiency, unspecified: Secondary | ICD-10-CM | POA: Diagnosis not present

## 2015-09-21 DIAGNOSIS — R072 Precordial pain: Secondary | ICD-10-CM | POA: Diagnosis not present

## 2015-09-21 DIAGNOSIS — I10 Essential (primary) hypertension: Secondary | ICD-10-CM | POA: Diagnosis not present

## 2015-09-22 ENCOUNTER — Ambulatory Visit (INDEPENDENT_AMBULATORY_CARE_PROVIDER_SITE_OTHER): Payer: Medicare Other | Admitting: Neurology

## 2015-09-22 ENCOUNTER — Encounter: Payer: Self-pay | Admitting: Neurology

## 2015-09-22 VITALS — BP 132/78 | HR 81 | Resp 16 | Ht 69.0 in | Wt 154.0 lb

## 2015-09-22 DIAGNOSIS — R413 Other amnesia: Secondary | ICD-10-CM | POA: Diagnosis not present

## 2015-09-22 DIAGNOSIS — R569 Unspecified convulsions: Secondary | ICD-10-CM

## 2015-09-22 DIAGNOSIS — R51 Headache: Secondary | ICD-10-CM

## 2015-09-22 DIAGNOSIS — G8929 Other chronic pain: Secondary | ICD-10-CM

## 2015-09-22 MED ORDER — TOPIRAMATE 100 MG PO TABS: 100.0000 mg | ORAL_TABLET | Freq: Two times a day (BID) | ORAL | Status: DC

## 2015-09-22 MED ORDER — GABAPENTIN 100 MG PO CAPS: ORAL_CAPSULE | ORAL | Status: DC
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
## 2015-09-24 ENCOUNTER — Encounter: Payer: Self-pay | Admitting: Podiatry

## 2015-09-24 ENCOUNTER — Ambulatory Visit (INDEPENDENT_AMBULATORY_CARE_PROVIDER_SITE_OTHER): Payer: Medicare Other | Admitting: Podiatry

## 2015-09-24 DIAGNOSIS — M779 Enthesopathy, unspecified: Secondary | ICD-10-CM | POA: Diagnosis not present

## 2015-09-24 MED ORDER — TRIAMCINOLONE ACETONIDE 10 MG/ML IJ SUSP
10.0000 mg | Freq: Once | INTRAMUSCULAR | Status: AC
  Administered 2015-09-24: 10 mg

## 2015-09-26 NOTE — Progress Notes (Signed)
Subjective:     Patient ID: Molly [name], female   DOB: Sep 11, 1976, 39 y.o.   MRN: [id number]  HPI patient states I'm doing pretty well with an area that is still hurting   Review of Systems     Objective:   Physical Exam Neurovascular status intact with continued discomfort on the dorsal left foot improved in nature but still present    Assessment:     Continue tendinitis-like symptoms    Plan:     Injected the tendon complex 3 mg Kenalog 5 mg Xylocaine and advised on physical therapy shoe gear modification and will be seen back as needed

## 2015-09-29 ENCOUNTER — Encounter: Payer: Self-pay | Admitting: Neurology

## 2015-09-29 DIAGNOSIS — R413 Other amnesia: Secondary | ICD-10-CM | POA: Insufficient documentation

## 2015-10-12 ENCOUNTER — Ambulatory Visit (INDEPENDENT_AMBULATORY_CARE_PROVIDER_SITE_OTHER): Payer: Medicare Other | Admitting: Podiatry

## 2015-10-12 ENCOUNTER — Encounter: Payer: Self-pay | Admitting: Podiatry

## 2015-10-12 VITALS — BP 132/78 | HR 75 | Resp 16

## 2015-10-12 DIAGNOSIS — M6588 Other synovitis and tenosynovitis, other site: Secondary | ICD-10-CM

## 2015-10-12 DIAGNOSIS — M7750 Other enthesopathy of unspecified foot: Secondary | ICD-10-CM

## 2015-10-12 NOTE — Progress Notes (Signed)
She presents today for follow-up of extensor tendinitis hallux left foot. She states that after the last injection he seemed to do well for the several days. Now seems to be back where it was initially.  Objective: She demonstrates to me how she is having pain at the level of the IP joint hallux left. She has pain on palpation or range of motion of this joint particularly dorsiflexion against resistance.  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  Plan: Injected the area today around the tendon with dexamethasone and local anesthetic. I will follow-up with her in 1 month or sooner if needed.  Molly [name] DPM

## 2015-10-18 ENCOUNTER — Other Ambulatory Visit: Payer: Medicare Other

## 2015-11-09 ENCOUNTER — Ambulatory Visit (INDEPENDENT_AMBULATORY_CARE_PROVIDER_SITE_OTHER): Payer: Medicare Other | Admitting: Podiatry

## 2015-11-09 ENCOUNTER — Encounter: Payer: Self-pay | Admitting: Podiatry

## 2015-11-09 VITALS — BP 138/94 | HR 82 | Resp 12

## 2015-11-09 DIAGNOSIS — G5792 Unspecified mononeuropathy of left lower limb: Secondary | ICD-10-CM

## 2015-11-09 DIAGNOSIS — M6588 Other synovitis and tenosynovitis, other site: Secondary | ICD-10-CM

## 2015-11-09 DIAGNOSIS — M779 Enthesopathy, unspecified: Secondary | ICD-10-CM | POA: Diagnosis not present

## 2015-11-09 DIAGNOSIS — M7750 Other enthesopathy of unspecified foot: Secondary | ICD-10-CM

## 2015-11-10 NOTE — Progress Notes (Signed)
She states that her left foot is still painful particularly when she wears particular shoe gear but she is that she think she is getting used to it at this point. She states it is easier for her to wear a higher heeled shoe than a small wedge. She is also concerned about ingrown toenail to the hallux left.  Objective: Vital signs are stable she is alert and oriented 3 she has very little pain on range of motion of the first metatarsophalangeal joint left foot. Much decrease in edema and scar tissue from previous exam. She does have a very sharp radial nail margin along the fibular border of the hallux left which is painful on palpation. It appears to be slightly aggravated from her picking at it and trying to remove the margin. I offered to remove this for her today and she declined. She would like to follow up appointment to have a matrixectomy performed.  Assessment: Well-healing surgical foot left. Ingrown nail hallux left.  Plan: Discussed etiology pathology conservative versus surgical therapies. Scheduled for back for matrixectomy in the next few weeks.

## 2015-11-25 ENCOUNTER — Ambulatory Visit: Payer: Medicare Other | Admitting: Podiatry

## 2016-01-20 ENCOUNTER — Other Ambulatory Visit: Payer: Self-pay | Admitting: Gastroenterology

## 2016-01-20 ENCOUNTER — Other Ambulatory Visit: Payer: Self-pay | Admitting: Family

## 2016-01-28 ENCOUNTER — Telehealth: Payer: Self-pay | Admitting: Neurology

## 2016-01-28 MED ORDER — ZOLMITRIPTAN 5 MG NA SOLN: 1.0000 | NASAL | Status: DC | PRN

## 2016-01-28 NOTE — Telephone Encounter (Signed)
Called patient and left her a vm with new medication information and directions. Will send Rx to her pharmacy. Asked her to call back if any further questions.

## 2016-01-28 NOTE — Telephone Encounter (Signed)
I spoke with patient. She states that she's had a headache for the past 2 weeks. Pain comes & goes causing nausea. She has tried taking Excedrin Migraine & 1 of her daughters Tramadol. She is asking for Rx for her headache.

## 2016-01-28 NOTE — Telephone Encounter (Signed)
Patient is having a lot of headaches. She would like to know if something could be called in for her. Her call back number is [phone]

## 2016-01-28 NOTE — Telephone Encounter (Signed)
Would do Zomig nasal spray as needed at onset of headache. Do not use more than 3 times a week. Pls let her know to avoid Tramadol as this can potentially lower threshold to have seizures. Thanks

## 2016-01-31 ENCOUNTER — Telehealth: Payer: Self-pay | Admitting: Family Medicine

## 2016-01-31 NOTE — Telephone Encounter (Signed)
Patients ins is not covering Zomig nasal spray, non formulary. She has tried Imitrex, Maxalt, & Relpax. PA or do you want to try different med??

## 2016-01-31 NOTE — Telephone Encounter (Signed)
Let's do PA, thanks!

## 2016-01-31 NOTE — Telephone Encounter (Signed)
PA was done & approved to 01/30/2017. I did notify patients pharmacy.

## 2016-02-01 ENCOUNTER — Telehealth: Payer: Self-pay | Admitting: Family

## 2016-02-01 ENCOUNTER — Telehealth: Payer: Self-pay | Admitting: Neurology

## 2016-02-01 NOTE — Telephone Encounter (Addendum)
Pt will est with Molly [name] in April. Pt needs a refill on anxiety med ?lexapro send to [facility]. Pt last seen padonda July 2016

## 2016-02-01 NOTE — Telephone Encounter (Signed)
PT called in regards to her medication being denied/Dawn CB#[phone]

## 2016-02-01 NOTE — Telephone Encounter (Signed)
Returned call. Left msg on vm that med was approved yesterday thorough PA. Advised her to call her pharmacy to see if Rx was ready for p/u.

## 2016-02-02 MED ORDER — ESCITALOPRAM OXALATE 10 MG PO TABS: ORAL_TABLET | ORAL | Status: DC

## 2016-02-02 NOTE — Telephone Encounter (Signed)
Rx was send  

## 2016-02-10 ENCOUNTER — Telehealth: Payer: Self-pay | Admitting: Family

## 2016-02-10 DIAGNOSIS — M542 Cervicalgia: Secondary | ICD-10-CM | POA: Diagnosis not present

## 2016-02-10 NOTE — Telephone Encounter (Signed)
Pt states she needs referral to [facility]. Pt states she needs to be seen for her back and hand. Pt states she is in a lot of pain. Pt is a medicare/medicaid pt. Pt is on the list for Dr Martinique.   Fax [phone]

## 2016-02-11 ENCOUNTER — Encounter: Payer: Self-pay | Admitting: Family Medicine

## 2016-02-11 ENCOUNTER — Ambulatory Visit (INDEPENDENT_AMBULATORY_CARE_PROVIDER_SITE_OTHER): Payer: Medicare Other | Admitting: Family Medicine

## 2016-02-11 VITALS — BP 130/100 | HR 90 | Temp 98.6°F | Wt 153.7 lb

## 2016-02-11 DIAGNOSIS — G4700 Insomnia, unspecified: Secondary | ICD-10-CM | POA: Diagnosis not present

## 2016-02-11 DIAGNOSIS — Z0189 Encounter for other specified special examinations: Secondary | ICD-10-CM

## 2016-02-11 DIAGNOSIS — M546 Pain in thoracic spine: Secondary | ICD-10-CM

## 2016-02-11 DIAGNOSIS — K219 Gastro-esophageal reflux disease without esophagitis: Secondary | ICD-10-CM

## 2016-02-11 DIAGNOSIS — F39 Unspecified mood [affective] disorder: Secondary | ICD-10-CM | POA: Diagnosis not present

## 2016-02-11 DIAGNOSIS — M797 Fibromyalgia: Secondary | ICD-10-CM

## 2016-02-11 DIAGNOSIS — M549 Dorsalgia, unspecified: Secondary | ICD-10-CM

## 2016-02-11 MED ORDER — GABAPENTIN 100 MG PO CAPS: ORAL_CAPSULE | ORAL | Status: DC

## 2016-02-11 MED ORDER — RABEPRAZOLE SODIUM 20 MG PO TBEC: 20.0000 mg | DELAYED_RELEASE_TABLET | Freq: Every day | ORAL | Status: DC

## 2016-02-11 MED ORDER — CYCLOBENZAPRINE HCL 10 MG PO TABS: ORAL_TABLET | ORAL | Status: DC

## 2016-02-11 MED ORDER — ESCITALOPRAM OXALATE 10 MG PO TABS: ORAL_TABLET | ORAL | Status: DC

## 2016-02-11 NOTE — Patient Instructions (Addendum)
Back Pain, Adult Back pain is very common. The pain often gets better over time. The cause of back pain is usually not dangerous. Most people can learn to manage their back pain on their own.  HOME CARE  Watch your back pain for any changes. The following actions may help to lessen any pain you are feeling:  Stay active. Start with short walks on flat ground if you can. Try to walk farther each day.  Exercise regularly as told by your doctor. Exercise helps your back heal faster. It also helps avoid future injury by keeping your muscles strong and flexible.  Do not sit, drive, or stand in one place for more than 30 minutes.  Do not stay in bed. Resting more than 1-2 days can slow down your recovery.  Be careful when you bend or lift an object. Use good form when lifting:  Bend at your knees.  Keep the object close to your body.  Do not twist.  Sleep on a firm mattress. Lie on your side, and bend your knees. If you lie on your back, put a pillow under your knees.  Take medicines only as told by your doctor.  Put ice on the injured area.  Put ice in a plastic bag.  Place a towel between your skin and the bag.  Leave the ice on for 20 minutes, 2-3 times a day for the first 2-3 days. After that, you can switch between ice and heat packs.  Avoid feeling anxious or stressed. Find good ways to deal with stress, such as exercise.  Maintain a healthy weight. Extra weight puts stress on your back. GET HELP IF:   You have pain that does not go away with rest or medicine.  You have worsening pain that goes down into your legs or buttocks.  You have pain that does not get better in one week.  You have pain at night.  You lose weight.  You have a fever or chills. GET HELP RIGHT AWAY IF:   Respiratory symptoms, weakness, numbness or tingling on arms.      Document Released: 04/17/2008 Document Revised: 11/20/2014 Document Reviewed: 03/03/2014 Elsevier Interactive Patient  Education [employer]. . Appt with psychiatrist is going to be arranged. No changes in Lexapro. I will not prescribe Ambien or Percocet. Gabapentin can also help with fibromyalgia and dosis can be titrate up as tolerated. Regular exercise and good sleep hygiene.

## 2016-02-11 NOTE — Progress Notes (Signed)
Pre visit review using our clinic review tool, if applicable. No additional management support is needed unless otherwise documented below in the visit note. 

## 2016-02-11 NOTE — Progress Notes (Signed)
Subjective:    Patient ID: Molly [name], female    DOB: 1976/02/08, 40 y.o.   MRN: [id number]  HPI  [name] is a 40 y.o.female here today with multiple complaints, she is holding her 38 months old daughter.  -Main reason for OV is upper sharp and constant back pain, which she reports having for about 2 days ago, denies prior Hx of back pain. She is requesting Rx for Percocet because "nothing" helps with pain. She denies any trauma or unusual activity that could cause pain. No associated cough, dyspnea, wheezing, or chest pain. She has not noted radiation to upper extremities, reports pain as severe, limiting movement. Pain is exacerbated by any movement and deep breathing. No skin rash or local edema. No associated fever or chills. Reporting Hx of fibromyalgia. She is on Gabapentin, she states that it was added for "seizures" by neurologists. Hx of migraines, has headaches "all the time", left hemicrania and lower occipital. Sometimes associated with nausea and photophobia. She is on Topamax 200 mg bid. S/P hysterectomy.  -She is also requesting referral to endocrinologists. According to pt, ortho recommended endocrinologists evaluation because  "something in my blood" causing of "knot" in arms and external ears, tender, size fluctuate, no erythema or drainage. She states that she already followed with dermatologists and "nothing" was recommended. She follows with ortho because carpal tunnel syndrome, knee and foot pain. She takes Diclofenac 75 mg bid as needed. She has taken Flexeril , ran out.  - She is also c/o insomnia, trouble falling and staying asleep for a while.She is taking Ambien, requesting refills. She states that Ambien does not help.  -She is on Lexapro 10 mg, which she takes for depression.She is also requesting  something for anxiety, she was on something before, she does not recall name but was taking it as needed. Hx of bipolar disorder, not following with  psychiatrists but did so long time ago.  She lives with her 2 daughters. She denies hospitalizations for psychiatric disorders. In the meddle of interrogation she said "I see things all the time", shadows around her house, when asked about hearing voices, she doe snot answer. FHx sister with bipolar disorder and schizophrenia. She denies any suicidal thoughts.  - GERD: Her insurance did not approve Nexium. She was on Prilosec but did not help with heartburn and acid reflux. No abdominal pain, vomiting, or changes in bowel habits.  - Her BP is elevated today, denies any hx of HTN. She does not check BP at home.     Current Outpatient Prescriptions on File Prior to Visit  Medication Sig Dispense Refill  . ammonium lactate (LAC-HYDRIN) 12 % lotion Apply 1 application topically as needed for dry skin. (Patient taking differently: Apply 1 application topically daily. ) 400 g 0  . aspirin EC 81 MG tablet Take 81 mg by mouth daily.    . Biotin w/ Vitamins C & E (HAIR SKIN & NAILS GUMMIES PO) Take 1 tablet by mouth daily.    Marland Kitchen CALCIUM PO Take by mouth every other day.    . cetirizine (ZYRTEC) 10 MG tablet TAKE 1 TABLET EVERY DAY 90 tablet 2  . cholecalciferol (VITAMIN D) 1000 UNITS tablet Take 1,000 Units by mouth daily.    . diclofenac (VOLTAREN) 75 MG EC tablet Take 1 tablet (75 mg total) by mouth 2 (two) times daily. 60 tablet 1  . econazole nitrate 1 % cream Apply topically daily. (Patient taking differently: Apply 1 application topically  daily. ) 85 g 5  . EPINEPHrine (EPIPEN 2-PAK) 0.3 mg/0.3 mL IJ SOAJ injection Inject 0.3 mLs (0.3 mg total) into the muscle once. 1 Device 0  . ferrous sulfate 325 (65 FE) MG tablet Take 325 mg by mouth every other day.    . fluticasone (FLONASE) 50 MCG/ACT nasal spray Place 2 sprays into both nostrils daily. 16 g 0  . ibuprofen (ADVIL,MOTRIN) 800 MG tablet Take 1 tablet (800 mg total) by mouth every 8 (eight) hours as needed. (Patient taking differently:  Take 800 mg by mouth every 8 (eight) hours as needed for moderate pain. ) 30 tablet 3  . Linaclotide (LINZESS) 145 MCG CAPS capsule Take 1 capsule (145 mcg total) by mouth daily. 30 capsule 3  . LINZESS 145 MCG CAPS capsule TAKE ONE CAPSULE BY MOUTH EVERY DAY 30 capsule 1  . metoCLOPramide (REGLAN) 10 MG tablet Take 1 tablet (10 mg total) by mouth every 6 (six) hours as needed for nausea. 30 tablet 0  . neomycin-polymyxin-hydrocortisone (CORTISPORIN) otic solution 1-2 drops to the toe after soaking twice daily (Patient taking differently: Apply 1-2 drops topically 2 (two) times daily as needed (for soaking toe). ) 10 mL 1  . ondansetron (ZOFRAN) 8 MG tablet Take 8 mg by mouth 3 (three) times daily as needed for nausea or vomiting.    Marland Kitchen oxyCODONE-acetaminophen (PERCOCET) 10-325 MG per tablet Take one tablet by mouth every six hours as needed for pain. 30 tablet 0  . PATADAY 0.2 % SOLN Place 1 drop into both eyes daily.     . polyethylene glycol (MIRALAX / GLYCOLAX) packet MIX CONTENTS OF 1 PACKET WITH 4 TO 8 OUNCES OF LIQUID AND DRINK EVERY DAY (Patient taking differently: MIX CONTENTS OF 1 PACKET WITH 4 TO 8 OUNCES OF LIQUID AND DRINK EVERY DAY as needed) 30 packet 0  . rizatriptan (MAXALT-MLT) 10 MG disintegrating tablet Take 1 tablet (10 mg total) by mouth 3 (three) times daily as needed for migraine. May repeat in 2 hours if needed 10 tablet 3  . topiramate (TOPAMAX) 100 MG tablet Take 1 tablet (100 mg total) by mouth 2 (two) times daily. 180 tablet 3  . zolmitriptan (ZOMIG) 5 MG nasal solution Place 1 spray into the nose as needed for migraine. May repeat in 2 hours. Do not use more than 3 times a week. 1 Units 3  . zolpidem (AMBIEN) 10 MG tablet TAKE 1 TABLET BY MOUTH EVERY DAY AT BEDTIME 30 tablet 0   No current facility-administered medications on file prior to visit.     Past Medical History  Diagnosis Date  . Migraine   . GERD (gastroesophageal reflux disease)   . Lumbago   . Seizures  (HCC)     last seizure nov 2014  . Allergic rhinitis   . Fibromyalgia   . Carpal tunnel syndrome on both sides   . High cholesterol     Social History   Social History  . Marital Status: Legally Separated    Spouse Name: N/A  . Number of Children: 2  . Years of Education: 12+   Occupational History  . unemployed    Social History Main Topics  . Smoking status: Never Smoker   . Smokeless tobacco: Never Used  . Alcohol Use: No  . Drug Use: No  . Sexual Activity: Not Asked   Other Topics Concern  . None   Social History Narrative   Patient lives at home.    Patient has  2 children.    Patient has a college education.    Patient is right handed.      Review of Systems  Constitutional: Positive for fatigue (no more than usual). Negative for fever, activity change, appetite change and unexpected weight change.  HENT: Negative for congestion, mouth sores, nosebleeds, rhinorrhea, sore throat and trouble swallowing.   Eyes: Negative for redness and visual disturbance.  Respiratory: Negative for cough, shortness of breath and wheezing.   Cardiovascular: Negative for chest pain, palpitations and leg swelling.  Gastrointestinal: Negative for nausea, vomiting, abdominal pain and blood in stool.       Negative for changes in bowel habits. + Heartburn and acid reflux.  Genitourinary: Negative for dysuria, frequency, hematuria and decreased urine volume.  Musculoskeletal: Positive for myalgias, back pain and arthralgias. Negative for joint swelling.  Skin: Negative for color change and rash.  Neurological: Positive for headaches. Negative for syncope, speech difficulty, weakness and numbness.  Hematological: Negative for adenopathy. Does not bruise/bleed easily.  Psychiatric/Behavioral: Positive for hallucinations (visual and ? auditive ) and sleep disturbance. Negative for suicidal ideas, confusion and self-injury. The patient is nervous/anxious.    Filed Vitals:   02/11/16 1422   BP: 130/100  Pulse: 90  Temp: 98.6 F (37 C)       Objective:   Physical Exam  Constitutional: She is oriented to person, place, and time. She appears well-developed and well-nourished. No distress.  HENT:  Head: Normocephalic and atraumatic.  Right Ear: External ear normal.  Left Ear: External ear normal.  Mouth/Throat: Uvula is midline, oropharynx is clear and moist and mucous membranes are normal. No oral lesions. No oropharyngeal exudate.  No lesions in ear canal appreciated, no erythema or tenderness  Eyes: Conjunctivae and EOM are normal. Pupils are equal, round, and reactive to light. Right eye exhibits no discharge. Left eye exhibits no discharge.  Neck: Neck supple. No thyroid mass and no thyromegaly present.  Cardiovascular: Normal rate, regular rhythm and normal heart sounds.   No murmur heard. Pulses:      Radial pulses are 2+ on the right side, and 2+ on the left side.       Dorsalis pedis pulses are 2+ on the right side, and 2+ on the left side.  Pulmonary/Chest: Effort normal and breath sounds normal.  Abdominal: Soft. There is no tenderness.  Musculoskeletal:       Cervical back: She exhibits tenderness. She exhibits no deformity.  No major deformity appreciated or signs of synovitis. Left trapezium tenderness and left interscapular area, mild.Pain is exacerbated by position changes from sitting to supine and viseversa.  I could not evaluate shoulder ROM or elbows, she is holding daughter and refused moving one at the time because it causes pain.  Lymphadenopathy:       Head (right side): No submandibular adenopathy present.       Head (left side): No submandibular adenopathy present.    She has no cervical adenopathy.       Right: No supraclavicular adenopathy present.       Left: No supraclavicular adenopathy present.  Neurological: She is alert and oriented to person, place, and time. She has normal strength. No cranial nerve deficit or sensory deficit.    Reflex Scores:      Patellar reflexes are 2+ on the right side and 2+ on the left side. Normal gait with no assistance needed.  Skin: Skin is warm. No rash noted.  No suspicious lesions or erythematous rash appreciated.Lesions  she point to are localized on proximal aspect of arm (R)  And axilla (B), underneath the skin, rounded, no tender, 2-3 mm.  Psychiatric: Her speech is normal. Her affect is not blunt. She expresses no suicidal ideation. She expresses no suicidal plans.  Flat mood        Assessment & Plan:  Molly [name] was seen today for establish care, she has multiple complaints today, most seem chronic except for upper back pain.  Diagnoses and all orders for this visit:  Gastroesophageal reflux disease without esophagitis GERD precautions discussed. Aciphex recommended, will change if insurance refuses to cover it. We discussed side effects of long term PPI intake. F/U in 2 months.  -     RABEprazole (ACIPHEX) 20 MG tablet; Take 1 tablet (20 mg total) by mouth daily.  Fibromyalgia She is on low dose of Gabapentin, recommended titrate dose up to 300 mg tid as tolerated.She has 100 mg caps. Some side effects of medication discussed as well as indications.She states that it was prescribed by neurologists for "seazures". Other treatment options discussed: Cymbalta and Savella but since she is on lexapro and Topamax I am not recommending neither one today. Regular low impact exercise and good sleep hygiene.Explained it is chronic. Flexeril side effects discussed as well as risk of interaction with some of her meds. I will not prescribe Percocet.  F/U in 2 months. -     gabapentin (NEURONTIN) 100 MG capsule; Take 1 capsule twice a day  Unspecified mood (affective) disorder (HCC) Hx of bipolar disorder, referral to psychiatrist placed.  No changes in current management.  -     escitalopram (LEXAPRO) 10 MG tablet; TAKE 1 TABLET (10 MG TOTAL) BY MOUTH DAILY. -     Ambulatory  referral to Psychiatry  Insomnia, unspecified OTC Melatonin and good sleep hygiene recommended. Ambien is not helping, this symptom could be part of her mood disorder.  Psychiatry referral placed.  Upper back pain Acute. Local heat and massage. Chiropractor evaluation may be beneficial. Relative rest. PT can be considered but she could also follow with ortho if needed.  -     cyclobenzaprine (FLEXERIL) 10 MG tablet; TAKE 1 TABLET BY MOUTH 3 TIMES A DAY AS NEEDED FOR SPASMS  Patient requested diagnostic testing I do not find indication for endocrinology referral based on hx she provided. She wants to see endocrinologists because according to her, it was recommended.  -Skin lesions she is reporting seems related to hair follicles, no active folliculitis, reassured. -     Ambulatory referral to Endocrinology  -BP rechecked 122/90, DBP still mildly elevated.Recommended monitoring BP at home.  Molly [name] G. [place], MD  [facility]. [place] [facility].

## 2016-02-14 DIAGNOSIS — M542 Cervicalgia: Secondary | ICD-10-CM | POA: Diagnosis not present

## 2016-02-18 ENCOUNTER — Other Ambulatory Visit: Payer: Self-pay | Admitting: Family

## 2016-02-24 ENCOUNTER — Other Ambulatory Visit: Payer: Self-pay | Admitting: Family

## 2016-02-24 NOTE — Telephone Encounter (Signed)
Pt does not understand why she cannot get the  zolpidem (AMBIEN) 10 MG tablet Refilled.  Pt states she needs that to sleep. Has only one tab left.  Also needs refill of: cetirizine (ZYRTEC) 10 MG tablet  [facility] [address]

## 2016-02-24 NOTE — Telephone Encounter (Signed)
Ok to refill it. 

## 2016-02-28 ENCOUNTER — Telehealth: Payer: Self-pay | Admitting: Family

## 2016-02-28 NOTE — Telephone Encounter (Addendum)
Pt would like to try something else instead of ambien. [facility] [address]. Pt has only one pill left

## 2016-02-29 NOTE — Telephone Encounter (Signed)
Voicemail was left for pt to return my call   

## 2016-03-08 NOTE — Telephone Encounter (Signed)
Pt would like to know if her Ambien and Zyrtec was called in.   Pharm:  [facility].  Pt need this to sleep.

## 2016-03-09 NOTE — Telephone Encounter (Signed)
Dr Maudie Mercury is not the pts PCP

## 2016-03-09 NOTE — Telephone Encounter (Signed)
Left message on machine for patient to return our call 

## 2016-03-09 NOTE — Telephone Encounter (Signed)
I thought she was already established with ortho, it is Ok to refer if she needs a new referral. She was referred to psychiatrist already, I explained I will not fill her Ambien or any other controlled medication. I believe this is the 3rd request for Ambien. She has Hx of bipolar disorder. Cetirizine 10 mg is OTC.  Thanks. BJ

## 2016-03-13 ENCOUNTER — Telehealth: Payer: Self-pay | Admitting: Family

## 2016-03-13 NOTE — Telephone Encounter (Signed)
Pt returning your call. Please call back °

## 2016-03-13 NOTE — Telephone Encounter (Signed)
I do not see where this pt has been called, I returned the call and left a voiceamail for her to cal the office back

## 2016-03-13 NOTE — Telephone Encounter (Signed)
Left message on machine for patient to return our call 

## 2016-03-14 NOTE — Telephone Encounter (Signed)
Attempted to contact patient. See other note about patient's concern. Awaiting call back

## 2016-03-14 NOTE — Telephone Encounter (Signed)
Left additional message on machine for patient to return our call

## 2016-03-17 ENCOUNTER — Other Ambulatory Visit: Payer: Self-pay | Admitting: Family Medicine

## 2016-03-17 DIAGNOSIS — F39 Unspecified mood [affective] disorder: Secondary | ICD-10-CM

## 2016-03-17 NOTE — Telephone Encounter (Signed)
Last seen 02/11/2016, last filled 02/11/2016

## 2016-03-21 ENCOUNTER — Other Ambulatory Visit: Payer: Self-pay

## 2016-03-21 DIAGNOSIS — F39 Unspecified mood [affective] disorder: Secondary | ICD-10-CM

## 2016-03-21 MED ORDER — ESCITALOPRAM OXALATE 10 MG PO TABS: ORAL_TABLET | ORAL | Status: DC

## 2016-05-04 ENCOUNTER — Telehealth: Payer: Self-pay | Admitting: Family Medicine

## 2016-05-04 NOTE — Telephone Encounter (Signed)
Pt has estabish appointment with [name] on 7/12.  Pt request refill  cetirizine (ZYRTEC) 10 MG tablet  90 day  [facility] [address]  Pt hopes we can refill and she will see cory in July

## 2016-05-05 ENCOUNTER — Other Ambulatory Visit: Payer: Self-pay

## 2016-05-05 MED ORDER — CETIRIZINE HCL 10 MG PO TABS: 10.0000 mg | ORAL_TABLET | Freq: Every day | ORAL | Status: DC

## 2016-05-05 NOTE — Telephone Encounter (Signed)
Refill sent to pharmacy.   

## 2016-05-24 ENCOUNTER — Encounter: Payer: Self-pay | Admitting: Adult Health

## 2016-05-24 ENCOUNTER — Ambulatory Visit (INDEPENDENT_AMBULATORY_CARE_PROVIDER_SITE_OTHER): Payer: Medicare Other | Admitting: Adult Health

## 2016-05-24 VITALS — BP 128/80 | Temp 98.3°F | Ht 69.0 in | Wt 155.1 lb

## 2016-05-24 DIAGNOSIS — R569 Unspecified convulsions: Secondary | ICD-10-CM | POA: Diagnosis not present

## 2016-05-24 DIAGNOSIS — G4700 Insomnia, unspecified: Secondary | ICD-10-CM | POA: Diagnosis not present

## 2016-05-24 DIAGNOSIS — Z760 Encounter for issue of repeat prescription: Secondary | ICD-10-CM

## 2016-05-24 DIAGNOSIS — M797 Fibromyalgia: Secondary | ICD-10-CM

## 2016-05-24 DIAGNOSIS — Z7689 Persons encountering health services in other specified circumstances: Secondary | ICD-10-CM

## 2016-05-24 DIAGNOSIS — Z7189 Other specified counseling: Secondary | ICD-10-CM

## 2016-05-24 MED ORDER — ONDANSETRON HCL 4 MG PO TABS: 4.0000 mg | ORAL_TABLET | Freq: Three times a day (TID) | ORAL | Status: DC | PRN

## 2016-05-24 MED ORDER — IBUPROFEN 800 MG PO TABS: 800.0000 mg | ORAL_TABLET | Freq: Three times a day (TID) | ORAL | Status: DC | PRN

## 2016-05-24 MED ORDER — OXYCODONE-ACETAMINOPHEN 10-325 MG PO TABS
ORAL_TABLET | ORAL | Status: DC
Start: 2016-05-24 — End: 2017-03-23

## 2016-05-24 MED ORDER — ZOLPIDEM TARTRATE 10 MG PO TABS: 10.0000 mg | ORAL_TABLET | Freq: Every day | ORAL | Status: DC

## 2016-05-24 NOTE — Progress Notes (Signed)
Pre visit review using our clinic review tool, if applicable. No additional management support is needed unless otherwise documented below in the visit note. 

## 2016-05-24 NOTE — Patient Instructions (Signed)
It was great meeting you today  Schedule an appointment with me for your physical. If you need anything in the meantime, please let me know  Someone from pain management will call you to schedule an appointment   Follow up with psychiatry
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
## 2016-05-25 DIAGNOSIS — G4700 Insomnia, unspecified: Secondary | ICD-10-CM | POA: Insufficient documentation

## 2016-06-22 ENCOUNTER — Other Ambulatory Visit: Payer: Self-pay | Admitting: Adult Health

## 2016-06-22 MED ORDER — VITAMIN D 1000 UNITS PO TABS: 1000.0000 [IU] | ORAL_TABLET | Freq: Every day | ORAL | 0 refills | Status: DC

## 2016-07-05 ENCOUNTER — Telehealth: Payer: Self-pay | Admitting: Gastroenterology

## 2016-07-05 NOTE — Telephone Encounter (Signed)
Patient also states that she has been taking two pills daily instead of one. She states that she is still having constipation and is wanting to know if there is anything else she can take with this.

## 2016-07-06 MED ORDER — POLYETHYLENE GLYCOL 3350 17 G PO PACK: PACK | ORAL | 1 refills | Status: DC

## 2016-07-06 MED ORDER — LINACLOTIDE 145 MCG PO CAPS: 145.0000 ug | ORAL_CAPSULE | Freq: Every day | ORAL | 1 refills | Status: DC

## 2016-07-06 NOTE — Telephone Encounter (Signed)
The pt has been advised to take Miralax and continue linzess as prescribed.  Keep f/u appt as scheduled.  Pt agreed and verbalized understanding of the instructions.  Refills sent to the pharmacy for linzess and miralax

## 2016-07-12 ENCOUNTER — Encounter: Payer: Self-pay | Admitting: Adult Health

## 2016-07-12 ENCOUNTER — Ambulatory Visit (INDEPENDENT_AMBULATORY_CARE_PROVIDER_SITE_OTHER): Payer: Medicare Other | Admitting: Adult Health

## 2016-07-12 VITALS — BP 124/72 | Temp 98.0°F | Ht 69.0 in | Wt 156.2 lb

## 2016-07-12 DIAGNOSIS — E7800 Pure hypercholesterolemia, unspecified: Secondary | ICD-10-CM

## 2016-07-12 DIAGNOSIS — R569 Unspecified convulsions: Secondary | ICD-10-CM | POA: Diagnosis not present

## 2016-07-12 DIAGNOSIS — Z760 Encounter for issue of repeat prescription: Secondary | ICD-10-CM | POA: Diagnosis not present

## 2016-07-12 DIAGNOSIS — M797 Fibromyalgia: Secondary | ICD-10-CM | POA: Diagnosis not present

## 2016-07-12 DIAGNOSIS — R1011 Right upper quadrant pain: Secondary | ICD-10-CM

## 2016-07-12 LAB — BASIC METABOLIC PANEL
BUN: 10 mg/dL (ref 6–23)
CO2: 31 mEq/L (ref 19–32)
CREATININE: 0.95 mg/dL (ref 0.40–1.20)
Calcium: 9 mg/dL (ref 8.4–10.5)
Chloride: 102 mEq/L (ref 96–112)
GFR: 84.78 mL/min (ref >60.00)
Glucose, Bld: 89 mg/dL (ref 70–99)
POTASSIUM: 3.9 meq/L (ref 3.5–5.1)
Sodium: 140 mEq/L (ref 135–145)

## 2016-07-12 LAB — POC URINALSYSI DIPSTICK (AUTOMATED)
BILIRUBIN UA: NEGATIVE
Blood, UA: NEGATIVE
Glucose, UA: NEGATIVE
KETONES UA: NEGATIVE
Leukocytes, UA: NEGATIVE
NITRITE UA: NEGATIVE
PH UA: 7
Protein, UA: NEGATIVE
Spec Grav, UA: 1.015
Urobilinogen, UA: 0.2

## 2016-07-12 LAB — CBC WITH DIFFERENTIAL/PLATELET
Basophils Absolute: 0 10*3/uL (ref 0.0–0.1)
Basophils Relative: 0.4 % (ref 0.0–3.0)
EOS ABS: 0.1 10*3/uL (ref 0.0–0.7)
Eosinophils Relative: 1.1 % (ref 0.0–5.0)
HCT: 35.1 % — ABNORMAL LOW (ref 36.0–46.0)
HEMOGLOBIN: 12 g/dL (ref 12.0–15.0)
Lymphocytes Relative: 36.4 % (ref 12.0–46.0)
Lymphs Abs: 1.7 10*3/uL (ref 0.7–4.0)
MCHC: 34.2 g/dL (ref 30.0–36.0)
MCV: 90.8 fl (ref 78.0–100.0)
MONO ABS: 0.3 10*3/uL (ref 0.1–1.0)
Monocytes Relative: 6.4 % (ref 3.0–12.0)
Neutro Abs: 2.6 10*3/uL (ref 1.4–7.7)
Neutrophils Relative %: 55.7 % (ref 43.0–77.0)
Platelets: 221 10*3/uL (ref 150.0–400.0)
RBC: 3.87 Mil/uL (ref 3.87–5.11)
RDW: 12.8 % (ref 11.5–15.5)
WBC: 4.7 10*3/uL (ref 4.0–10.5)

## 2016-07-12 LAB — HEPATIC FUNCTION PANEL
ALT: 14 U/L (ref 0–35)
AST: 16 U/L (ref 0–37)
Albumin: 4.1 g/dL (ref 3.5–5.2)
Alkaline Phosphatase: 75 U/L (ref 39–117)
BILIRUBIN DIRECT: 0 mg/dL (ref 0.0–0.3)
BILIRUBIN TOTAL: 0.4 mg/dL (ref 0.2–1.2)
Total Protein: 7.3 g/dL (ref 6.0–8.3)

## 2016-07-12 LAB — LIPID PANEL
CHOLESTEROL: 240 mg/dL — AB (ref 0–200)
HDL: 56.6 mg/dL (ref >39.00)
LDL CALC: 161 mg/dL — AB (ref 0–99)
NonHDL: 183.68
TRIGLYCERIDES: 114 mg/dL (ref 0.0–149.0)
Total CHOL/HDL Ratio: 4
VLDL: 22.8 mg/dL (ref 0.0–40.0)

## 2016-07-12 LAB — TSH: TSH: 2.23 u[IU]/mL (ref 0.35–4.50)

## 2016-07-12 MED ORDER — PATADAY 0.2 % OP SOLN: 1.0000 [drp] | Freq: Every day | OPHTHALMIC | 6 refills | Status: DC
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
## 2016-07-12 NOTE — Patient Instructions (Addendum)
It was great seeing you today!  I will call you regarding your labs.   Please follow up with Neurology.   Work on diet and exercise.   Health Maintenance, Female Adopting a healthy lifestyle and getting preventive care can go a long way to promote health and wellness. Talk with your health care provider about what schedule of regular examinations is right for you. This is a good chance for you to check in with your provider about disease prevention and staying healthy. In between checkups, there are plenty of things you can do on your own. Experts have done a lot of research about which lifestyle changes and preventive measures are most likely to keep you healthy. Ask your health care provider for more information. WEIGHT AND DIET  Eat a healthy diet  Be sure to include plenty of vegetables, fruits, low-fat dairy products, and lean protein.  Do not eat a lot of foods high in solid fats, added sugars, or salt.  Get regular exercise. This is one of the most important things you can do for your health.  Most adults should exercise for at least 150 minutes each week. The exercise should increase your heart rate and make you sweat (moderate-intensity exercise).  Most adults should also do strengthening exercises at least twice a week. This is in addition to the moderate-intensity exercise.  Maintain a healthy weight  Body mass index (BMI) is a measurement that can be used to identify possible weight problems. It estimates body fat based on height and weight. Your health care provider can help determine your BMI and help you achieve or maintain a healthy weight.  For females 87 years of age and older:   A BMI below 18.5 is considered underweight.  A BMI of 18.5 to 24.9 is normal.  A BMI of 25 to 29.9 is considered overweight.  A BMI of 30 and above is considered obese.  Watch levels of cholesterol and blood lipids  You should start having your blood tested for lipids and  cholesterol at 38 years of age, then have this test every 5 years.  You may need to have your cholesterol levels checked more often if:  Your lipid or cholesterol levels are high.  You are older than 38 years of age.  You are at high risk for heart disease.  CANCER SCREENING   Lung Cancer  Lung cancer screening is recommended for adults 60-66 years old who are at high risk for lung cancer because of a history of smoking.  A yearly low-dose CT scan of the lungs is recommended for people who:  Currently smoke.  Have quit within the past 15 years.  Have at least a 30-pack-year history of smoking. A pack year is smoking an average of one pack of cigarettes a day for 1 year.  Yearly screening should continue until it has been 15 years since you quit.  Yearly screening should stop if you develop a health problem that would prevent you from having lung cancer treatment.  Breast Cancer  Practice breast self-awareness. This means understanding how your breasts normally appear and feel.  It also means doing regular breast self-exams. Let your health care provider know about any changes, no matter how small.  If you are in your 20s or 30s, you should have a clinical breast exam (CBE) by a health care provider every 1-3 years as part of a regular health exam.  If you are 78 or older, have a CBE every year. Also  consider having a breast X-ray (mammogram) every year.  If you have a family history of breast cancer, talk to your health care provider about genetic screening.  If you are at high risk for breast cancer, talk to your health care provider about having an MRI and a mammogram every year.  Breast cancer gene (BRCA) assessment is recommended for women who have family members with BRCA-related cancers. BRCA-related cancers include:  Breast.  Ovarian.  Tubal.  Peritoneal cancers.  Results of the assessment will determine the need for genetic counseling and BRCA1 and BRCA2  testing. Cervical Cancer Your health care provider may recommend that you be screened regularly for cancer of the pelvic organs (ovaries, uterus, and vagina). This screening involves a pelvic examination, including checking for microscopic changes to the surface of your cervix (Pap test). You may be encouraged to have this screening done every 3 years, beginning at age 31.  For women ages 67-65, health care providers may recommend pelvic exams and Pap testing every 3 years, or they may recommend the Pap and pelvic exam, combined with testing for human papilloma virus (HPV), every 5 years. Some types of HPV increase your risk of cervical cancer. Testing for HPV may also be done on women of any age with unclear Pap test results.  Other health care providers may not recommend any screening for nonpregnant women who are considered low risk for pelvic cancer and who do not have symptoms. Ask your health care provider if a screening pelvic exam is right for you.  If you have had past treatment for cervical cancer or a condition that could lead to cancer, you need Pap tests and screening for cancer for at least 20 years after your treatment. If Pap tests have been discontinued, your risk factors (such as having a new sexual partner) need to be reassessed to determine if screening should resume. Some women have medical problems that increase the chance of getting cervical cancer. In these cases, your health care provider may recommend more frequent screening and Pap tests. Colorectal Cancer  This type of cancer can be detected and often prevented.  Routine colorectal cancer screening usually begins at 38 years of age and continues through 38 years of age.  Your health care provider may recommend screening at an earlier age if you have risk factors for colon cancer.  Your health care provider may also recommend using home test kits to check for hidden blood in the stool.  A small camera at the end of a  tube can be used to examine your colon directly (sigmoidoscopy or colonoscopy). This is done to check for the earliest forms of colorectal cancer.  Routine screening usually begins at age 25.  Direct examination of the colon should be repeated every 5-10 years through 38 years of age. However, you may need to be screened more often if early forms of precancerous polyps or small growths are found. Skin Cancer  Check your skin from head to toe regularly.  Tell your health care provider about any new moles or changes in moles, especially if there is a change in a mole's shape or color.  Also tell your health care provider if you have a mole that is larger than the size of a pencil eraser.  Always use sunscreen. Apply sunscreen liberally and repeatedly throughout the day.  Protect yourself by wearing long sleeves, pants, a wide-brimmed hat, and sunglasses whenever you are outside. HEART DISEASE, DIABETES, AND HIGH BLOOD PRESSURE   High  blood pressure causes heart disease and increases the risk of stroke. High blood pressure is more likely to develop in:  People who have blood pressure in the high end of the normal range (130-139/85-89 mm Hg).  People who are overweight or obese.  People who are African American.  If you are 30-90 years of age, have your blood pressure checked every 3-5 years. If you are 83 years of age or older, have your blood pressure checked every year. You should have your blood pressure measured twice--once when you are at a hospital or clinic, and once when you are not at a hospital or clinic. Record the average of the two measurements. To check your blood pressure when you are not at a hospital or clinic, you can use:  An automated blood pressure machine at a pharmacy.  A home blood pressure monitor.  If you are between 66 years and 43 years old, ask your health care provider if you should take aspirin to prevent strokes.  Have regular diabetes screenings. This  involves taking a blood sample to check your fasting blood sugar level.  If you are at a normal weight and have a low risk for diabetes, have this test once every three years after 38 years of age.  If you are overweight and have a high risk for diabetes, consider being tested at a younger age or more often. PREVENTING INFECTION  Hepatitis B  If you have a higher risk for hepatitis B, you should be screened for this virus. You are considered at high risk for hepatitis B if:  You were born in a country where hepatitis B is common. Ask your health care provider which countries are considered high risk.  Your parents were born in a high-risk country, and you have not been immunized against hepatitis B (hepatitis B vaccine).  You have HIV or AIDS.  You use needles to inject street drugs.  You live with someone who has hepatitis B.  You have had sex with someone who has hepatitis B.  You get hemodialysis treatment.  You take certain medicines for conditions, including cancer, organ transplantation, and autoimmune conditions. Hepatitis C  Blood testing is recommended for:  Everyone born from [birth year] through 1963.  Anyone with known risk factors for hepatitis C. Sexually transmitted infections (STIs)  You should be screened for sexually transmitted infections (STIs) including gonorrhea and chlamydia if:  You are sexually active and are younger than 38 years of age.  You are older than 38 years of age and your health care provider tells you that you are at risk for this type of infection.  Your sexual activity has changed since you were last screened and you are at an increased risk for chlamydia or gonorrhea. Ask your health care provider if you are at risk.  If you do not have HIV, but are at risk, it may be recommended that you take a prescription medicine daily to prevent HIV infection. This is called pre-exposure prophylaxis (PrEP). You are considered at risk if:  You are  sexually active and do not regularly use condoms or know the HIV status of your partner(s).  You take drugs by injection.  You are sexually active with a partner who has HIV. Talk with your health care provider about whether you are at high risk of being infected with HIV. If you choose to begin PrEP, you should first be tested for HIV. You should then be tested every 3 months for as long  as you are taking PrEP.  PREGNANCY   If you are premenopausal and you may become pregnant, ask your health care provider about preconception counseling.  If you may become pregnant, take 400 to 800 micrograms (mcg) of folic acid every day.  If you want to prevent pregnancy, talk to your health care provider about birth control (contraception). OSTEOPOROSIS AND MENOPAUSE   Osteoporosis is a disease in which the bones lose minerals and strength with aging. This can result in serious bone fractures. Your risk for osteoporosis can be identified using a bone density scan.  If you are 65 years of age or older, or if you are at risk for osteoporosis and fractures, ask your health care provider if you should be screened.  Ask your health care provider whether you should take a calcium or vitamin D supplement to lower your risk for osteoporosis.  Menopause may have certain physical symptoms and risks.  Hormone replacement therapy may reduce some of these symptoms and risks. Talk to your health care provider about whether hormone replacement therapy is right for you.  HOME CARE INSTRUCTIONS   Schedule regular health, dental, and eye exams.  Stay current with your immunizations.   Do not use any tobacco products including cigarettes, chewing tobacco, or electronic cigarettes.  If you are pregnant, do not drink alcohol.  If you are breastfeeding, limit how much and how often you drink alcohol.  Limit alcohol intake to no more than 1 drink per day for nonpregnant women. One drink equals 12 ounces of beer, 5  ounces of wine, or 1 ounces of hard liquor.  Do not use street drugs.  Do not share needles.  Ask your health care provider for help if you need support or information about quitting drugs.  Tell your health care provider if you often feel depressed.  Tell your health care provider if you have ever been abused or do not feel safe at home.   This information is not intended to replace advice given to you by your health care provider. Make sure you discuss any questions you have with your health care provider.   Document Released: 05/15/2011 Document Revised: 11/20/2014 Document Reviewed: 10/01/2013 Elsevier Interactive Patient Education 2016 [employer]  

## 2016-07-14 NOTE — Addendum Note (Signed)
Addended by: Apolinar Junes on: 07/14/2016 07:04 AM   Modules accepted: Orders

## 2016-08-01 ENCOUNTER — Ambulatory Visit: Payer: Medicare Other | Admitting: Physical Medicine & Rehabilitation

## 2016-08-02 ENCOUNTER — Other Ambulatory Visit: Payer: Self-pay | Admitting: Adult Health

## 2016-08-02 ENCOUNTER — Telehealth: Payer: Self-pay | Admitting: Adult Health

## 2016-08-02 NOTE — Telephone Encounter (Signed)
Please advise 

## 2016-08-02 NOTE — Telephone Encounter (Signed)
Rx sent in

## 2016-08-02 NOTE — Telephone Encounter (Signed)
Pharmacy called to request refill esomeprazole (NEXIUM) 20 MG capsule  90 day   Last filled by padonda 04/2015

## 2016-08-02 NOTE — Telephone Encounter (Signed)
Ok to refill for one year  

## 2016-08-07 ENCOUNTER — Other Ambulatory Visit: Payer: Self-pay | Admitting: Family Medicine

## 2016-08-14 ENCOUNTER — Encounter: Payer: Medicare Other | Attending: Physical Medicine & Rehabilitation

## 2016-08-14 ENCOUNTER — Encounter: Payer: Self-pay | Admitting: Physical Medicine & Rehabilitation

## 2016-08-14 ENCOUNTER — Ambulatory Visit (HOSPITAL_BASED_OUTPATIENT_CLINIC_OR_DEPARTMENT_OTHER): Payer: Medicare Other | Admitting: Physical Medicine & Rehabilitation

## 2016-08-14 VITALS — BP 138/87 | HR 82 | Resp 14

## 2016-08-14 DIAGNOSIS — F0781 Postconcussional syndrome: Secondary | ICD-10-CM | POA: Diagnosis not present

## 2016-08-14 DIAGNOSIS — Z820 Family history of epilepsy and other diseases of the nervous system: Secondary | ICD-10-CM | POA: Diagnosis not present

## 2016-08-14 DIAGNOSIS — K219 Gastro-esophageal reflux disease without esophagitis: Secondary | ICD-10-CM | POA: Insufficient documentation

## 2016-08-14 DIAGNOSIS — R200 Anesthesia of skin: Secondary | ICD-10-CM | POA: Diagnosis not present

## 2016-08-14 DIAGNOSIS — R569 Unspecified convulsions: Secondary | ICD-10-CM | POA: Diagnosis not present

## 2016-08-14 DIAGNOSIS — G5603 Carpal tunnel syndrome, bilateral upper limbs: Secondary | ICD-10-CM | POA: Insufficient documentation

## 2016-08-14 DIAGNOSIS — G4700 Insomnia, unspecified: Secondary | ICD-10-CM | POA: Diagnosis not present

## 2016-08-14 DIAGNOSIS — M797 Fibromyalgia: Secondary | ICD-10-CM | POA: Insufficient documentation

## 2016-08-14 DIAGNOSIS — Z90710 Acquired absence of both cervix and uterus: Secondary | ICD-10-CM | POA: Insufficient documentation

## 2016-08-14 DIAGNOSIS — E7800 Pure hypercholesterolemia, unspecified: Secondary | ICD-10-CM | POA: Diagnosis not present

## 2016-08-14 MED ORDER — PREGABALIN 50 MG PO CAPS: 50.0000 mg | ORAL_CAPSULE | Freq: Two times a day (BID) | ORAL | 0 refills | Status: DC
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
## 2016-08-14 NOTE — Patient Instructions (Addendum)
Recommend walking 91min or stationary bicycling every other day  Will do EMG/NCV for evaluation of a nerve compression  Ask PCP for psychologist referral  Lyrica 50mg  twice a day

## 2016-08-15 ENCOUNTER — Telehealth: Payer: Self-pay | Admitting: Gastroenterology

## 2016-08-15 MED ORDER — LINACLOTIDE 145 MCG PO CAPS: 145.0000 ug | ORAL_CAPSULE | Freq: Every day | ORAL | 2 refills | Status: DC

## 2016-08-15 NOTE — Telephone Encounter (Signed)
Pt has been notified that she MUST keep ROV for further refills.  She was given 2 refills until Nov appt.

## 2016-08-21 ENCOUNTER — Other Ambulatory Visit: Payer: Self-pay

## 2016-08-21 NOTE — Telephone Encounter (Signed)
Request received from [facility] for 90 day supply of linzess, pt needs to keep appt for further refills

## 2016-08-22 ENCOUNTER — Other Ambulatory Visit: Payer: Self-pay | Admitting: Adult Health

## 2016-08-22 ENCOUNTER — Ambulatory Visit
Admission: RE | Admit: 2016-08-22 | Discharge: 2016-08-22 | Disposition: A | Discharge status: Home / Self Care | Payer: Medicare Other | Source: Ambulatory Visit | Attending: Adult Health | Admitting: Adult Health

## 2016-08-22 DIAGNOSIS — R1012 Left upper quadrant pain: Secondary | ICD-10-CM | POA: Diagnosis not present

## 2016-08-22 DIAGNOSIS — R1011 Right upper quadrant pain: Secondary | ICD-10-CM

## 2016-08-23 NOTE — Telephone Encounter (Signed)
Spoke to patient and informed her of her [place] results. Her pain continues on the left side. She is taking her constipation medication. Has a follow up with GI in the next few weeks.   Will consider CT of abdomen after follow up with GI

## 2016-09-12 ENCOUNTER — Encounter (INDEPENDENT_AMBULATORY_CARE_PROVIDER_SITE_OTHER): Payer: Self-pay

## 2016-09-12 ENCOUNTER — Ambulatory Visit (HOSPITAL_BASED_OUTPATIENT_CLINIC_OR_DEPARTMENT_OTHER): Payer: Medicare Other | Admitting: Physical Medicine & Rehabilitation

## 2016-09-12 ENCOUNTER — Other Ambulatory Visit: Payer: Self-pay | Admitting: Adult Health

## 2016-09-12 ENCOUNTER — Encounter: Payer: Self-pay | Admitting: Physical Medicine & Rehabilitation

## 2016-09-12 VITALS — BP 135/88 | HR 94 | Resp 14

## 2016-09-12 DIAGNOSIS — M797 Fibromyalgia: Secondary | ICD-10-CM | POA: Diagnosis not present

## 2016-09-12 DIAGNOSIS — Z760 Encounter for issue of repeat prescription: Secondary | ICD-10-CM

## 2016-09-12 DIAGNOSIS — M79641 Pain in right hand: Secondary | ICD-10-CM

## 2016-09-12 DIAGNOSIS — K219 Gastro-esophageal reflux disease without esophagitis: Secondary | ICD-10-CM | POA: Diagnosis not present

## 2016-09-12 DIAGNOSIS — R200 Anesthesia of skin: Secondary | ICD-10-CM | POA: Diagnosis not present

## 2016-09-12 DIAGNOSIS — E7800 Pure hypercholesterolemia, unspecified: Secondary | ICD-10-CM | POA: Diagnosis not present

## 2016-09-12 DIAGNOSIS — M79642 Pain in left hand: Secondary | ICD-10-CM

## 2016-09-12 DIAGNOSIS — G4700 Insomnia, unspecified: Secondary | ICD-10-CM

## 2016-09-12 DIAGNOSIS — F0781 Postconcussional syndrome: Secondary | ICD-10-CM | POA: Diagnosis not present

## 2016-09-12 MED ORDER — PREGABALIN 50 MG PO CAPS: 50.0000 mg | ORAL_CAPSULE | Freq: Two times a day (BID) | ORAL | 2 refills | Status: DC

## 2016-09-12 NOTE — Progress Notes (Signed)
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  Will prescribe wrist splints to be worn at night. Follow-up in 6 weeks for possible right carpal tunnel injection if symptoms do not subside

## 2016-09-12 NOTE — Telephone Encounter (Signed)
Ok to refill   Ambien for one month, the rest for a year

## 2016-09-14 ENCOUNTER — Ambulatory Visit (INDEPENDENT_AMBULATORY_CARE_PROVIDER_SITE_OTHER): Payer: Medicare Other | Admitting: Podiatry

## 2016-09-14 ENCOUNTER — Other Ambulatory Visit: Payer: Self-pay | Admitting: Adult Health

## 2016-09-14 ENCOUNTER — Ambulatory Visit (INDEPENDENT_AMBULATORY_CARE_PROVIDER_SITE_OTHER): Payer: Medicare Other

## 2016-09-14 ENCOUNTER — Encounter: Payer: Self-pay | Admitting: Podiatry

## 2016-09-14 DIAGNOSIS — L600 Ingrowing nail: Secondary | ICD-10-CM | POA: Diagnosis not present

## 2016-09-14 DIAGNOSIS — M7750 Other enthesopathy of unspecified foot: Secondary | ICD-10-CM

## 2016-09-14 NOTE — Telephone Encounter (Signed)
Ok to refill for one year  

## 2016-09-14 NOTE — Progress Notes (Signed)
She presents today chief complaint of painful hallux left. She states it is been doing well since the time of surgery however recently it has become painful right here as she points to the proximal phalanx of the left hallux. She's also complaining of painful ingrown toenails to the hallux bilaterally which she only wants trimmed back she does not want to have it permanently removed.  Objective: Vital signs are stable she is alert and oriented 3. Pulses are palpable. She has pain on dorsiflexion of the first metatarsophalangeal joint but only in the mid diaphyseal region of the proximal phalanx. There is no edema no erythema cellulitis drainage or odor. No signs of infection. No open wounds no lesions. Radiographs taken today of the left foot demonstrates retention of a single 28-gauge monofilament wire from a previous Akin osteotomy. No fractures no bony overgrowth no spurs no tumors. Cutaneous evaluation does demonstrate mildly incurvated nail margins along the tibial and fibular border.  Assessment: Ingrown toenails 2 and fibular border hallux bilateral. Tendinitis flexor hallucis longus mid diaphyseal region of the proximal phalanx. I also think there is some scar tissue present there.  Plan: I injected the area today with dexamethasone and local anesthetic and I debrided the nails for her. I will follow-up with her in a few weeks.

## 2016-09-15 ENCOUNTER — Ambulatory Visit: Payer: Medicare Other

## 2016-09-15 ENCOUNTER — Ambulatory Visit: Payer: Medicare Other | Attending: Physical Medicine & Rehabilitation

## 2016-09-15 DIAGNOSIS — R42 Dizziness and giddiness: Secondary | ICD-10-CM | POA: Diagnosis not present

## 2016-09-15 DIAGNOSIS — M797 Fibromyalgia: Secondary | ICD-10-CM

## 2016-09-15 DIAGNOSIS — R29810 Facial weakness: Secondary | ICD-10-CM

## 2016-09-15 DIAGNOSIS — R2689 Other abnormalities of gait and mobility: Secondary | ICD-10-CM | POA: Diagnosis not present

## 2016-09-15 DIAGNOSIS — R41841 Cognitive communication deficit: Secondary | ICD-10-CM | POA: Diagnosis not present

## 2016-09-15 NOTE — Patient Instructions (Signed)
Memory Compensation Strategies  1. Use "WARM" strategy. W= write it down A=  associate it R=  repeat it M=  make a mental picture  2. You can keep a Social worker. Use a 3-ring notebook with sections for the following:  calendar, important names and phone numbers, medications, doctors' names/phone numbers, "to do list"/reminders, and a section to journal what you did each day  3. Use a calendar to write appointments down.   4. Write yourself a schedule for the day.  This can be placed on the calendar or in a separate section of the Memory Notebook.  Keeping a regular schedule can help memory.  5. Use medication organizer with sections for each day or morning/evening pills. A medication reminder app may help too. You may need help loading it  6. Keep a basket, or pegboard by the door.   Place items that you need to take out with you in the basket or on the pegboard.  You may also want to include a message board for reminders.  7. Use sticky notes or reminders on your phone Place sticky notes with reminders in a place where the task is performed.  For example:  "turn off the stove" placed by the stove, "lock the door" placed on the door at eye level, "take your medications" on the bathroom mirror or by the place where you normally take your medications  8. Use alarms/timers.  Use while cooking to remind yourself to check on food or as a reminder to take your medicine, or as a reminder to make a call, or as a reminder to perform another task, etc.  9. Use a small tape recorder or a voice recorder app to record important information and notes for yourself.
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
## 2016-09-25 ENCOUNTER — Encounter: Payer: Medicare Other | Admitting: *Deleted

## 2016-09-26 ENCOUNTER — Ambulatory Visit: Payer: Medicare Other | Admitting: Gastroenterology

## 2016-09-29 ENCOUNTER — Ambulatory Visit: Payer: Medicare Other

## 2016-09-29 DIAGNOSIS — R29810 Facial weakness: Secondary | ICD-10-CM | POA: Diagnosis not present

## 2016-09-29 DIAGNOSIS — M797 Fibromyalgia: Secondary | ICD-10-CM | POA: Diagnosis not present

## 2016-09-29 DIAGNOSIS — R2689 Other abnormalities of gait and mobility: Secondary | ICD-10-CM | POA: Diagnosis not present

## 2016-09-29 DIAGNOSIS — R41841 Cognitive communication deficit: Secondary | ICD-10-CM | POA: Diagnosis not present

## 2016-09-29 DIAGNOSIS — R42 Dizziness and giddiness: Secondary | ICD-10-CM | POA: Diagnosis not present

## 2016-09-29 NOTE — Patient Instructions (Signed)
Bridge    Lie back, legs bent. Tuck in stomach muscles and lift hips towards ceiling, hold for 2 seconds and then slowly lower hips back down.  Tepeat __5__ times. Do __2__ sessions per day. Perform 3-4 days week.   http://pm.[email or web address].[email or web address]   Copyright  VHI. All rights reserved.   Piriformis Stretch    Lying on back, pull left knee toward opposite shoulder. Hold __30__ seconds. Repeat __3__ times. Do __1__ sessions per day.  [email or web address]://[email or web address].[email or web address]   Copyright  VHI. All rights reserved.   Piriformis Stretch, Supine    Lie supine, one ankle crossed onto opposite knee. Holding bottom leg behind knee, gently pull legs toward chest until stretch is felt in buttock of top leg. Hold _30__ seconds. For deeper stretch gently push top knee away from body.  Repeat _3__ times per session. Do _1__ sessions per day.  Copyright  VHI. All rights reserved.    Lower Trunk Rotation Stretch    Keeping back flat and feet together, rotate knees to left side. Hold 30 seconds. Then repeat to the other side. Hold _30___ seconds. Place pillow to the side, so your knees can rest. Repeat __3__ times per set. Do __1__ sets per session. Do __1__ sessions per day.  http://orth.exer.[email or web address]   Copyright  VHI. All rights reserved.
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
## 2016-10-02 ENCOUNTER — Ambulatory Visit (INDEPENDENT_AMBULATORY_CARE_PROVIDER_SITE_OTHER): Payer: Medicare Other | Admitting: Physician Assistant

## 2016-10-02 ENCOUNTER — Ambulatory Visit: Payer: Medicare Other | Admitting: Physical Therapy

## 2016-10-02 ENCOUNTER — Encounter: Payer: Self-pay | Admitting: Physician Assistant

## 2016-10-02 VITALS — BP 104/72 | HR 96 | Ht 69.0 in | Wt 152.4 lb

## 2016-10-02 DIAGNOSIS — K5909 Other constipation: Secondary | ICD-10-CM

## 2016-10-02 DIAGNOSIS — R2689 Other abnormalities of gait and mobility: Secondary | ICD-10-CM | POA: Diagnosis not present

## 2016-10-02 DIAGNOSIS — R41841 Cognitive communication deficit: Secondary | ICD-10-CM | POA: Diagnosis not present

## 2016-10-02 DIAGNOSIS — R42 Dizziness and giddiness: Secondary | ICD-10-CM | POA: Diagnosis not present

## 2016-10-02 DIAGNOSIS — R109 Unspecified abdominal pain: Secondary | ICD-10-CM

## 2016-10-02 DIAGNOSIS — M797 Fibromyalgia: Secondary | ICD-10-CM

## 2016-10-02 DIAGNOSIS — R110 Nausea: Secondary | ICD-10-CM | POA: Diagnosis not present

## 2016-10-02 DIAGNOSIS — R29810 Facial weakness: Secondary | ICD-10-CM | POA: Diagnosis not present

## 2016-10-02 MED ORDER — LINACLOTIDE 290 MCG PO CAPS: 290.0000 ug | ORAL_CAPSULE | Freq: Every day | ORAL | 1 refills | Status: DC

## 2016-10-02 MED ORDER — POLYETHYLENE GLYCOL 3350 17 G PO PACK: PACK | ORAL | 1 refills | Status: DC
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
## 2016-10-02 NOTE — Patient Instructions (Signed)
Fall Prevention in the Home Falls can cause injuries and can affect people from all age groups. There are many simple things that you can do to make your home safe and to help prevent falls. What can I do on the outside of my home?  Regularly repair the edges of walkways and driveways and fix any cracks.  Remove high doorway thresholds.  Trim any shrubbery on the main path into your home.  Use bright outdoor lighting.  Clear walkways of debris and clutter, including tools and rocks.  Regularly check that handrails are securely fastened and in good repair. Both sides of any steps should have handrails.  Install guardrails along the edges of any raised decks or porches.  Have leaves, snow, and ice cleared regularly.  Use sand or salt on walkways during winter months.  In the garage, clean up any spills right away, including grease or oil spills. What can I do in the bathroom?  Use night lights.  Install grab bars by the toilet and in the tub and shower. Do not use towel bars as grab bars.  Use non-skid mats or decals on the floor of the tub or shower.  If you need to sit down while you are in the shower, use a plastic, non-slip stool.  Keep the floor dry. Immediately clean up any water that spills on the floor.  Remove soap buildup in the tub or shower on a regular basis.  Attach bath mats securely with double-sided non-slip rug tape.  Remove throw rugs and other tripping hazards from the floor. What can I do in the bedroom?  Use night lights.  Make sure that a bedside light is easy to reach.  Do not use oversized bedding that drapes onto the floor.  Have a firm chair that has side arms to use for getting dressed.  Remove throw rugs and other tripping hazards from the floor. What can I do in the kitchen?  Clean up any spills right away.  Avoid walking on wet floors.  Place frequently used items in easy-to-reach places.  If you need to reach for something above  you, use a sturdy step stool that has a grab bar.  Keep electrical cables out of the way.  Do not use floor polish or wax that makes floors slippery. If you have to use wax, make sure that it is non-skid floor wax.  Remove throw rugs and other tripping hazards from the floor. What can I do in the stairways?  Do not leave any items on the stairs.  Make sure that there are handrails on both sides of the stairs. Fix handrails that are broken or loose. Make sure that handrails are as long as the stairways.  Check any carpeting to make sure that it is firmly attached to the stairs. Fix any carpet that is loose or worn.  Avoid having throw rugs at the top or bottom of stairways, or secure the rugs with carpet tape to prevent them from moving.  Make sure that you have a light switch at the top of the stairs and the bottom of the stairs. If you do not have them, have them installed. What are some other fall prevention tips?  Wear closed-toe shoes that fit well and support your feet. Wear shoes that have rubber soles or low heels.  When you use a stepladder, make sure that it is completely opened and that the sides are firmly locked. Have someone hold the ladder while you are using   it. Do not climb a closed stepladder.  Add color or contrast paint or tape to grab bars and handrails in your home. Place contrasting color strips on the first and last steps.  Use mobility aids as needed, such as canes, walkers, scooters, and crutches.  Turn on lights if it is dark. Replace any light bulbs that burn out.  Set up furniture so that there are clear paths. Keep the furniture in the same spot.  Fix any uneven floor surfaces.  Choose a carpet design that does not hide the edge of steps of a stairway.  Be aware of any and all pets.  Review your medicines with your healthcare provider. Some medicines can cause dizziness or changes in blood pressure, which increase your risk of falling. Talk with  your health care provider about other ways that you can decrease your risk of falls. This may include working with a physical therapist or trainer to improve your strength, balance, and endurance. This information is not intended to replace advice given to you by your health care provider. Make sure you discuss any questions you have with your health care provider. Document Released: 10/20/2002 Document Revised: 03/28/2016 Document Reviewed: 12/04/2014 Elsevier Interactive Patient Education  [employer].   It is important to avoid accidents which may result in broken bones.  Here are a few ideas on how to make your home safer so you will be less likely to trip or fall.  1. Use nonskid mats or non slip strips in your shower or tub, on your bathroom floor and around sinks.  If you know that you have spilled water, wipe it up! 2. In the bathroom, it is important to have properly installed grab bars on the walls or on the edge of the tub.  Towel racks are NOT strong enough for you to hold onto or to pull on for support. 3. Stairs and hallways should have enough light.  Add lamps or night lights if you need ore light. 4. It is good to have handrails on both sides of the stairs if possible.  Always fix broken handrails right away. 5. It is important to see the edges of steps.  Paint the edges of outdoor steps white so you can see them better.  Put colored tape on the edge of inside steps. 6. Throw-rugs are dangerous because they can slide.  Removing the rugs is the best idea, but if they must stay, add adhesive carpet tape to prevent slipping. 7. Do not keep things on stairs or in the halls.  Remove small furniture that blocks the halls as it may cause you to trip.  Keep telephone and electrical cords out of the way where you walk. 8. Always were sturdy, rubber-soled shoes for good support.  Never wear just socks, especially on the stairs.  Socks may cause you to slip or fall.  Do not wear  full-length housecoats as you can easily trip on the bottom.  9. Place the things you use the most on the shelves that are the easiest to reach.  If you use a stepstool, make sure it is in good condition.  If you feel unsteady, DO NOT climb, ask for help. 10. If a health professional advises you to use a cane or walker, do not be ashamed.  These items can keep you from falling and breaking your bones.
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
## 2016-10-02 NOTE — Patient Instructions (Signed)
If you are age 38 or older, your body mass index should be between 23-30. Your Body mass index is 22.5 kg/m. If this is out of the aforementioned range listed, please consider follow up with your Primary Care Provider.  If you are age 64 or younger, your body mass index should be between 19-25. Your Body mass index is 22.5 kg/m. If this is out of the aformentioned range listed, please consider follow up with your Primary Care Provider.   We have sent the following medications to your pharmacy for you to pick up at your convenience: Linzess and Miralax  Please follow up with Dr Owens Loffler in 6 months.   Thank you for choosing [facility]  J.Lemmon, P.A

## 2016-10-03 NOTE — Progress Notes (Signed)
I agree with the above note, plan 

## 2016-10-10 ENCOUNTER — Ambulatory Visit: Payer: Medicare Other | Admitting: Speech Pathology

## 2016-10-10 ENCOUNTER — Ambulatory Visit: Payer: Medicare Other | Admitting: Physical Therapy

## 2016-10-10 DIAGNOSIS — M797 Fibromyalgia: Secondary | ICD-10-CM | POA: Diagnosis not present

## 2016-10-10 DIAGNOSIS — R29810 Facial weakness: Secondary | ICD-10-CM | POA: Diagnosis not present

## 2016-10-10 DIAGNOSIS — R2689 Other abnormalities of gait and mobility: Secondary | ICD-10-CM

## 2016-10-10 DIAGNOSIS — R41841 Cognitive communication deficit: Secondary | ICD-10-CM

## 2016-10-10 DIAGNOSIS — R42 Dizziness and giddiness: Secondary | ICD-10-CM | POA: Diagnosis not present
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
## 2016-10-10 NOTE — Patient Instructions (Addendum)
   Cognitive Activities you can do at home:   - [place] (easy level)  - [place]  On your computer, tablet or phone:  [email or web address] Merchandiser, retail Hour Chocolate Fix Sort it out [employer] App Photo Quiz App MixTwo App What's the Word?App   Designate and LABEL a spot for your keys when you first get home, have ONE spot for your remote  Be mindful when you are taking meds not to walk around with them  Get a binder for all of your exercises and handouts with sections for PT/ST/ and calendar   Memory Strategies  W - Write it down  A - Associate it with something  R - Repeat it  M - Mental Image     Play the memory game  Try to remember 3-5 items on your store list without looking  Study a detailed picture in a magazine for 1 minute, then write down everything you can remember from the picture

## 2016-10-13 ENCOUNTER — Ambulatory Visit: Payer: Medicare Other

## 2016-10-13 ENCOUNTER — Ambulatory Visit: Payer: Medicare Other | Attending: Physical Medicine & Rehabilitation

## 2016-10-13 ENCOUNTER — Telehealth: Payer: Self-pay

## 2016-10-13 DIAGNOSIS — M797 Fibromyalgia: Secondary | ICD-10-CM | POA: Diagnosis not present

## 2016-10-13 DIAGNOSIS — R41841 Cognitive communication deficit: Secondary | ICD-10-CM

## 2016-10-13 DIAGNOSIS — R2689 Other abnormalities of gait and mobility: Secondary | ICD-10-CM | POA: Insufficient documentation

## 2016-10-13 NOTE — Telephone Encounter (Signed)
Dr. Letta Pate  Molly [name] is requesting new support knee braces and ankle braces, as she reports her braces are in disrepair and no longer fit. I have only assessed one ankle brace and it was in working condition but did not fit well into her shoe.  I educated pt that I would prefer her to only wear the braces on days she's experiencing incr. Pain and fatigue, as it would be beneficial for pt to attempt ambulation without braces to improve strength. However, she is unable to amb. If pain is severe. If you feel pt would benefit from braces to reduce pain and fatigue, please write an order for B support knee/ankle braces.   Thank you, Geoffry Paradise, PT,DPT 10/13/16 4:23 PM Phone: [phone] Fax: [phone]
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
## 2016-10-13 NOTE — Patient Instructions (Signed)
Use your phone for reminders!!
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
## 2016-10-17 ENCOUNTER — Ambulatory Visit: Payer: Medicare Other | Admitting: *Deleted

## 2016-10-17 ENCOUNTER — Ambulatory Visit: Payer: Medicare Other | Admitting: Physical Therapy

## 2016-10-17 NOTE — Telephone Encounter (Signed)
Sounds like she just needs to get new shoes with braces, half size larger

## 2016-10-18 ENCOUNTER — Other Ambulatory Visit: Payer: Self-pay

## 2016-10-18 MED ORDER — PREGABALIN 50 MG PO CAPS: 50.0000 mg | ORAL_CAPSULE | Freq: Two times a day (BID) | ORAL | 0 refills | Status: DC

## 2016-10-20 ENCOUNTER — Ambulatory Visit: Payer: Medicare Other

## 2016-10-24 ENCOUNTER — Encounter: Payer: Medicare Other | Attending: Physical Medicine & Rehabilitation

## 2016-10-24 ENCOUNTER — Other Ambulatory Visit: Payer: Self-pay | Admitting: Physical Medicine & Rehabilitation

## 2016-10-24 ENCOUNTER — Ambulatory Visit: Payer: Medicare Other | Admitting: Physical Medicine & Rehabilitation

## 2016-10-24 ENCOUNTER — Encounter: Payer: Self-pay | Admitting: Physical Medicine & Rehabilitation

## 2016-10-24 ENCOUNTER — Ambulatory Visit
Admission: RE | Admit: 2016-10-24 | Discharge: 2016-10-24 | Disposition: A | Discharge status: Home / Self Care | Payer: Medicare Other | Source: Ambulatory Visit | Attending: Physical Medicine & Rehabilitation | Admitting: Physical Medicine & Rehabilitation

## 2016-10-24 VITALS — BP 140/94 | HR 79

## 2016-10-24 DIAGNOSIS — M25562 Pain in left knee: Secondary | ICD-10-CM

## 2016-10-24 DIAGNOSIS — K219 Gastro-esophageal reflux disease without esophagitis: Secondary | ICD-10-CM | POA: Insufficient documentation

## 2016-10-24 DIAGNOSIS — M25571 Pain in right ankle and joints of right foot: Secondary | ICD-10-CM

## 2016-10-24 DIAGNOSIS — M25561 Pain in right knee: Secondary | ICD-10-CM | POA: Diagnosis not present

## 2016-10-24 DIAGNOSIS — Z820 Family history of epilepsy and other diseases of the nervous system: Secondary | ICD-10-CM | POA: Diagnosis not present

## 2016-10-24 DIAGNOSIS — M25532 Pain in left wrist: Secondary | ICD-10-CM | POA: Diagnosis not present

## 2016-10-24 DIAGNOSIS — M797 Fibromyalgia: Secondary | ICD-10-CM | POA: Diagnosis not present

## 2016-10-24 DIAGNOSIS — M25572 Pain in left ankle and joints of left foot: Secondary | ICD-10-CM

## 2016-10-24 DIAGNOSIS — G5603 Carpal tunnel syndrome, bilateral upper limbs: Secondary | ICD-10-CM | POA: Insufficient documentation

## 2016-10-24 DIAGNOSIS — R200 Anesthesia of skin: Secondary | ICD-10-CM | POA: Diagnosis not present

## 2016-10-24 DIAGNOSIS — G8929 Other chronic pain: Secondary | ICD-10-CM

## 2016-10-24 DIAGNOSIS — E7800 Pure hypercholesterolemia, unspecified: Secondary | ICD-10-CM | POA: Diagnosis not present

## 2016-10-24 DIAGNOSIS — R569 Unspecified convulsions: Secondary | ICD-10-CM | POA: Insufficient documentation

## 2016-10-24 DIAGNOSIS — G4700 Insomnia, unspecified: Secondary | ICD-10-CM | POA: Diagnosis not present

## 2016-10-24 DIAGNOSIS — F0781 Postconcussional syndrome: Secondary | ICD-10-CM | POA: Insufficient documentation

## 2016-10-24 DIAGNOSIS — Z90710 Acquired absence of both cervix and uterus: Secondary | ICD-10-CM | POA: Diagnosis not present

## 2016-10-24 DIAGNOSIS — S6992XA Unspecified injury of left wrist, hand and finger(s), initial encounter: Secondary | ICD-10-CM | POA: Diagnosis not present

## 2016-10-24 NOTE — Patient Instructions (Addendum)
I have ordered a Left wrist Xray
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
## 2016-10-25 ENCOUNTER — Other Ambulatory Visit: Payer: Self-pay | Admitting: Neurology

## 2016-10-25 DIAGNOSIS — R51 Headache: Secondary | ICD-10-CM

## 2016-10-25 DIAGNOSIS — G8929 Other chronic pain: Secondary | ICD-10-CM

## 2016-10-25 DIAGNOSIS — R569 Unspecified convulsions: Secondary | ICD-10-CM

## 2016-10-25 NOTE — Telephone Encounter (Signed)
RX sent to Pharmacy, per last OV note patient to continue.

## 2016-10-26 ENCOUNTER — Ambulatory Visit: Payer: Medicare Other

## 2016-10-26 DIAGNOSIS — M797 Fibromyalgia: Secondary | ICD-10-CM | POA: Diagnosis not present

## 2016-10-26 DIAGNOSIS — R41841 Cognitive communication deficit: Secondary | ICD-10-CM | POA: Diagnosis not present

## 2016-10-26 DIAGNOSIS — R2689 Other abnormalities of gait and mobility: Secondary | ICD-10-CM | POA: Diagnosis not present
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
## 2016-11-03 ENCOUNTER — Ambulatory Visit: Payer: Medicare Other

## 2016-11-03 DIAGNOSIS — R2689 Other abnormalities of gait and mobility: Secondary | ICD-10-CM

## 2016-11-03 DIAGNOSIS — M797 Fibromyalgia: Secondary | ICD-10-CM

## 2016-11-03 DIAGNOSIS — R41841 Cognitive communication deficit: Secondary | ICD-10-CM
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
## 2016-11-03 NOTE — Patient Instructions (Addendum)
Bridge    Lie back, legs bent. Tuck in stomach muscles and lift hips towards ceiling, hold for 2 seconds and then slowly lower hips back down.  Tepeat __5__ times. Do __3__ sessions per day. Perform 3-4 days week.   http://pm.exer.us/55   Copyright  VHI. All rights reserved.   Piriformis Stretch    Lying on back, pull left knee toward opposite shoulder. Hold __30__ seconds. Repeat __3__ times. Do __1__ sessions per day.  http://gt2.exer.us/258   Copyright  VHI. All rights reserved.   Piriformis Stretch, Supine    Lie supine, one ankle crossed onto opposite knee. Holding bottom leg behind knee, gently pull legs toward chest until stretch is felt in buttock of top leg. Hold _30__ seconds. For deeper stretch gently push top knee away from body.  Repeat _3__ times per session. Do _1__ sessions per day.  Copyright  VHI. All rights reserved.    Lower Trunk Rotation Stretch    Keeping back flat and feet together, rotate knees to left side. Hold 30 seconds. Then repeat to the other side. Hold _30___ seconds. Place pillow to the side, so your knees can rest. Repeat __3__ times per set. Do __1__ sets per session. Do __1__ sessions per day.  http://orth.exer.us/123   Copyright  VHI. All rights reserved.    Side-Stepping    Walk to left side with eyes open. Take 10 even steps, leading with same foot. Make sure each foot lifts off the floor. Repeat in opposite direction. Repeat for __4__ laps per session. Do __1__ sessions per day.   Copyright  VHI. All rights reserved.  Backward    Walk backwards with eyes open. Take 10 even steps, making sure each foot lifts off floor. Repeat for __4__ last per session. Do __1__ sessions per day.  Copyright  VHI. All rights reserved.
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
## 2016-11-10 ENCOUNTER — Telehealth: Payer: Self-pay | Admitting: *Deleted

## 2016-11-10 ENCOUNTER — Other Ambulatory Visit: Payer: Self-pay | Admitting: Podiatry

## 2016-11-10 ENCOUNTER — Ambulatory Visit: Payer: Medicare Other

## 2016-11-10 DIAGNOSIS — R41841 Cognitive communication deficit: Secondary | ICD-10-CM | POA: Diagnosis not present

## 2016-11-10 DIAGNOSIS — R2689 Other abnormalities of gait and mobility: Secondary | ICD-10-CM | POA: Diagnosis not present

## 2016-11-10 DIAGNOSIS — M797 Fibromyalgia: Secondary | ICD-10-CM | POA: Diagnosis not present

## 2016-11-10 MED ORDER — ECONAZOLE NITRATE 1 % EX CREA: TOPICAL_CREAM | Freq: Every day | CUTANEOUS | 1 refills | Status: DC

## 2016-11-10 MED ORDER — AMMONIUM LACTATE 12 % EX LOTN: 1.0000 "application " | TOPICAL_LOTION | CUTANEOUS | 1 refills | Status: DC | PRN

## 2016-11-10 NOTE — Patient Instructions (Signed)
  Please complete the assigned speech therapy homework (apps on your phone for 60 minutes each day) prior to your next session.

## 2016-11-10 NOTE — Telephone Encounter (Signed)
Pt request Ammonium lactate and econazole nitrate creams and to make an appt. Transferred to schedulers and refilled each +1refill.

## 2016-11-10 NOTE — Telephone Encounter (Signed)
Pt states she would like both of the creams that Dr. Milinda Pointer prescribes together. I reviewed her medication list and Dr. Milinda Pointer prescribes Ammonium Lactate and Econazole nitrate creams. I told her I would refill both +1refill each and if she continued to have problems to make an appt. Pt asked if I wanted her to schedule now an appt now, and I told he that would be fine, and transferred to schedule.
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
## 2016-11-14 ENCOUNTER — Other Ambulatory Visit: Payer: Self-pay | Admitting: Family Medicine

## 2016-11-14 ENCOUNTER — Ambulatory Visit: Payer: Medicare Other

## 2016-11-14 NOTE — Therapy (Signed)
[facility] [address] [place], [place], [address] Phone: [phone]   Fax:  [phone]  Patient Details  Name: Molly [name] MRN: [id number] Date of Birth: 1976-04-27 Referring Provider:  No ref. provider found  Encounter Date: Dec 08, 2016  PHYSICAL THERAPY DISCHARGE SUMMARY  Visits from Start of Care: 6  Current functional level related to goals / functional outcomes:     PT Short Term Goals - 10/13/16 1609      PT SHORT TERM GOAL #1   Title Pt will verbalize understanding of fall prevention strategies to reduce falls risk. TARGET DATE FOR ALL STGS: 10/13/16   Status Partially Met     PT SHORT TERM GOAL #2   Title Pt will improve FGA score to >/=21/30 to decr. falls risk.    Status Partially Met     PT SHORT TERM GOAL #3   Title Perform 6MWT and write goals.    Status Achieved     PT SHORT TERM GOAL #4   Title Pt will amb. 500' over even terrain at IND level to improve functional mobility.    Status Partially Met         PT Long Term Goals - 10/13/16 1610      PT LONG TERM GOAL #1   Title Pt will be IND in HEP to improve balance, strength, and reduce pain. TARGET DATE: 11/10/16   Status On-going     PT LONG TERM GOAL #2   Title Pt will verbalize plans to join aquatic exercise program upon d/c to maintain gains made during PT.    Status On-going     PT LONG TERM GOAL #3   Title Pt will amb. 700' over even/uneven terrain, IND, with pain not incr. > 2 points to improve functional mobility.    Status On-going     PT LONG TERM GOAL #4   Title Pt will improve FGA score to >/=25/30 to decr. falls risk.    Status On-going     PT LONG TERM GOAL #5   Title Pt will improve DHI score from 82% to 64% in order to improve quality of life.    Status On-going     PT LONG TERM GOAL #6   Title Pt will improve 6WMT distance from 492' to 692' to improve endurance.    Status On-going        Remaining  deficits: Unknown, as pt called to cancel remaining visits due to cold weather.   Education / Equipment: HEP  Plan: Patient agrees to discharge.  Patient goals were partially met. Patient is being discharged due to not returning since the last visit.  ?????       Eros Montour L 12-08-2016, 4:09 PM      G-Codes - 2016/12/08 1608    Functional Assessment Tool Used FGA: 20/30 on 10/13/16   Functional Limitation Mobility: Walking and moving around   Mobility: Walking and Moving Around Goal Status [phone]) At least 20 percent but less than 40 percent impaired, limited or restricted   Mobility: Walking and Moving Around Discharge Status [phone]) At least 40 percent but less than 60 percent impaired, limited or restricted       [facility] [address] [place], [place], [address] Phone: [phone]   Fax:  [phone]   Geoffry Paradise, PT,DPT 12-08-16 4:10 PM Phone: [phone] Fax: [phone]

## 2016-11-16 ENCOUNTER — Ambulatory Visit: Payer: Medicare Other

## 2016-11-17 ENCOUNTER — Encounter: Payer: Self-pay | Admitting: Physical Medicine & Rehabilitation

## 2016-11-17 ENCOUNTER — Encounter: Payer: Medicare Other | Attending: Physical Medicine & Rehabilitation

## 2016-11-17 ENCOUNTER — Ambulatory Visit (HOSPITAL_BASED_OUTPATIENT_CLINIC_OR_DEPARTMENT_OTHER): Payer: Medicare Other | Admitting: Physical Medicine & Rehabilitation

## 2016-11-17 VITALS — BP 124/87 | HR 85 | Resp 14

## 2016-11-17 DIAGNOSIS — E7800 Pure hypercholesterolemia, unspecified: Secondary | ICD-10-CM | POA: Diagnosis not present

## 2016-11-17 DIAGNOSIS — G4700 Insomnia, unspecified: Secondary | ICD-10-CM | POA: Insufficient documentation

## 2016-11-17 DIAGNOSIS — F0781 Postconcussional syndrome: Secondary | ICD-10-CM | POA: Insufficient documentation

## 2016-11-17 DIAGNOSIS — R200 Anesthesia of skin: Secondary | ICD-10-CM | POA: Insufficient documentation

## 2016-11-17 DIAGNOSIS — Z820 Family history of epilepsy and other diseases of the nervous system: Secondary | ICD-10-CM | POA: Insufficient documentation

## 2016-11-17 DIAGNOSIS — M797 Fibromyalgia: Secondary | ICD-10-CM | POA: Insufficient documentation

## 2016-11-17 DIAGNOSIS — Z90710 Acquired absence of both cervix and uterus: Secondary | ICD-10-CM | POA: Diagnosis not present

## 2016-11-17 DIAGNOSIS — R569 Unspecified convulsions: Secondary | ICD-10-CM | POA: Diagnosis not present

## 2016-11-17 DIAGNOSIS — M25532 Pain in left wrist: Secondary | ICD-10-CM | POA: Diagnosis not present

## 2016-11-17 DIAGNOSIS — K219 Gastro-esophageal reflux disease without esophagitis: Secondary | ICD-10-CM | POA: Diagnosis not present

## 2016-11-17 DIAGNOSIS — M79642 Pain in left hand: Secondary | ICD-10-CM | POA: Diagnosis not present

## 2016-11-17 DIAGNOSIS — G5603 Carpal tunnel syndrome, bilateral upper limbs: Secondary | ICD-10-CM | POA: Diagnosis not present

## 2016-11-17 NOTE — Patient Instructions (Addendum)
Left wrist splint during the for 6 wks, RTC 6wks May remove the wrist splint for bathing and also one hour a day for range of motion
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
## 2016-11-21 ENCOUNTER — Other Ambulatory Visit: Payer: Self-pay | Admitting: Adult Health

## 2016-11-21 ENCOUNTER — Ambulatory Visit (INDEPENDENT_AMBULATORY_CARE_PROVIDER_SITE_OTHER): Payer: Medicare Other | Admitting: Podiatry

## 2016-11-21 ENCOUNTER — Ambulatory Visit: Payer: Medicare Other | Admitting: Adult Health

## 2016-11-21 ENCOUNTER — Encounter: Payer: Self-pay | Admitting: Podiatry

## 2016-11-21 DIAGNOSIS — M779 Enthesopathy, unspecified: Secondary | ICD-10-CM

## 2016-11-21 DIAGNOSIS — B353 Tinea pedis: Secondary | ICD-10-CM

## 2016-11-21 DIAGNOSIS — J069 Acute upper respiratory infection, unspecified: Secondary | ICD-10-CM | POA: Diagnosis not present

## 2016-11-21 MED ORDER — KETOCONAZOLE 2 % EX CREA: 1.0000 "application " | TOPICAL_CREAM | Freq: Every day | CUTANEOUS | 5 refills | Status: DC

## 2016-11-21 NOTE — Progress Notes (Signed)
Molly [name] presents today for follow-up of dry skin plantar aspect of the left foot. She also has pain on palpation and range of motion of the first metatarsophalangeal joint.  Objective: Vital signs are stable alert and oriented 3. Pulses are palpable. It appears that she has tinea pedis to the plantar aspect of the bilateral foot. She also has tenderness on palpation of the first metatarsophalangeal joint and on range of motion of the interphalangeal joint.  Assessment: Capsulitis left foot. Tinea pedis.  Plan: Start her on Nizoral cream and a compounded cream containing diclofenac. I will follow-up with her in the near future for surgical excision ingrown nails hallux left.

## 2016-11-22 MED ORDER — NONFORMULARY OR COMPOUNDED ITEM
2 refills | Status: DC
Start: 2016-11-22 — End: 2022-11-17

## 2016-11-22 NOTE — Addendum Note (Signed)
Addended by: Harriett Sine D on: 11/22/2016 08:41 AM   Modules accepted: Orders

## 2016-11-25 DIAGNOSIS — J029 Acute pharyngitis, unspecified: Secondary | ICD-10-CM | POA: Diagnosis not present

## 2016-11-25 DIAGNOSIS — J069 Acute upper respiratory infection, unspecified: Secondary | ICD-10-CM | POA: Diagnosis not present

## 2016-12-01 ENCOUNTER — Other Ambulatory Visit: Payer: Self-pay | Admitting: Adult Health

## 2016-12-01 MED ORDER — AZELASTINE HCL 0.05 % OP SOLN: 1.0000 [drp] | Freq: Two times a day (BID) | OPHTHALMIC | 12 refills | Status: DC

## 2016-12-07 ENCOUNTER — Encounter: Payer: Self-pay | Admitting: Podiatry

## 2016-12-07 ENCOUNTER — Ambulatory Visit (INDEPENDENT_AMBULATORY_CARE_PROVIDER_SITE_OTHER): Payer: Medicare Other | Admitting: Podiatry

## 2016-12-07 DIAGNOSIS — L600 Ingrowing nail: Secondary | ICD-10-CM | POA: Diagnosis not present

## 2016-12-07 MED ORDER — NEOMYCIN-POLYMYXIN-HC 1 % OT SOLN: OTIC | 1 refills | Status: DC

## 2016-12-07 NOTE — Progress Notes (Signed)
She presents today with a chief complaint painful ingrown toenails tibial and fibular border of the hallux bilateral has been dealing with them for quite some time would like to have them removed. She states that her topical anti-inflammatories that we provided seems to be doing very well particularly around the first metatarsophalangeal joint.  Objective: Vital signs are stable alert and oriented 3. Pulses are palpable. Neurologic sensorium is intact. Muscle strength is normal. Toenails are sharply incurvated thickened dystrophic painful on palpation.  Assessment: Pain and limp secondary to ingrown toenails to the inferior border of the hallux bilateral.  Plan: Chemical matrixectomy was performed today after local anesthesia was achieved. A prescription for both oral and written home-going instructions were provided as well as a prescription for Cortisporin Otic to be applied twice daily after soaking. I will follow-up with her in 1-2 weeks to make sure she is healing well.

## 2016-12-07 NOTE — Patient Instructions (Signed)

## 2016-12-11 ENCOUNTER — Other Ambulatory Visit: Payer: Self-pay

## 2016-12-11 MED ORDER — PREGABALIN 50 MG PO CAPS: 50.0000 mg | ORAL_CAPSULE | Freq: Two times a day (BID) | ORAL | 0 refills | Status: DC

## 2016-12-21 ENCOUNTER — Encounter: Payer: Self-pay | Admitting: Podiatry

## 2016-12-21 ENCOUNTER — Ambulatory Visit (INDEPENDENT_AMBULATORY_CARE_PROVIDER_SITE_OTHER): Payer: Self-pay | Admitting: Podiatry

## 2016-12-21 DIAGNOSIS — L600 Ingrowing nail: Secondary | ICD-10-CM

## 2016-12-21 NOTE — Patient Instructions (Signed)

## 2016-12-22 ENCOUNTER — Ambulatory Visit (HOSPITAL_BASED_OUTPATIENT_CLINIC_OR_DEPARTMENT_OTHER): Payer: Medicare Other | Admitting: Physical Medicine & Rehabilitation

## 2016-12-22 ENCOUNTER — Encounter: Payer: Self-pay | Admitting: Physical Medicine & Rehabilitation

## 2016-12-22 ENCOUNTER — Encounter: Payer: Medicare Other | Attending: Physical Medicine & Rehabilitation

## 2016-12-22 VITALS — BP 159/93 | HR 101 | Resp 14

## 2016-12-22 DIAGNOSIS — Z820 Family history of epilepsy and other diseases of the nervous system: Secondary | ICD-10-CM | POA: Diagnosis not present

## 2016-12-22 DIAGNOSIS — M797 Fibromyalgia: Secondary | ICD-10-CM | POA: Diagnosis not present

## 2016-12-22 DIAGNOSIS — G5603 Carpal tunnel syndrome, bilateral upper limbs: Secondary | ICD-10-CM | POA: Diagnosis not present

## 2016-12-22 DIAGNOSIS — R200 Anesthesia of skin: Secondary | ICD-10-CM | POA: Diagnosis not present

## 2016-12-22 DIAGNOSIS — F0781 Postconcussional syndrome: Secondary | ICD-10-CM | POA: Diagnosis not present

## 2016-12-22 DIAGNOSIS — Z90710 Acquired absence of both cervix and uterus: Secondary | ICD-10-CM | POA: Insufficient documentation

## 2016-12-22 DIAGNOSIS — G4700 Insomnia, unspecified: Secondary | ICD-10-CM | POA: Diagnosis not present

## 2016-12-22 DIAGNOSIS — M79642 Pain in left hand: Secondary | ICD-10-CM | POA: Diagnosis not present

## 2016-12-22 DIAGNOSIS — E7800 Pure hypercholesterolemia, unspecified: Secondary | ICD-10-CM | POA: Insufficient documentation

## 2016-12-22 DIAGNOSIS — R569 Unspecified convulsions: Secondary | ICD-10-CM | POA: Diagnosis not present

## 2016-12-22 DIAGNOSIS — K219 Gastro-esophageal reflux disease without esophagitis: Secondary | ICD-10-CM | POA: Diagnosis not present

## 2016-12-22 MED ORDER — PREGABALIN 75 MG PO CAPS: 75.0000 mg | ORAL_CAPSULE | Freq: Two times a day (BID) | ORAL | 0 refills | Status: DC

## 2016-12-22 NOTE — Patient Instructions (Addendum)
If increased Lyrica not helpful for hands call and will   Do not think your Fibromyalgia is reason for Handicapped placard
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
## 2016-12-24 NOTE — Progress Notes (Signed)
She presents today for her two-week nail check states that both of her great toes seem to be doing fine that they're still tender. His been soaking in Epsom salts but has been applying Neosporin and Neosporin drops.  Objective: Vital signs are stable alert and oriented 3. Pulses are palpable. The margins appear to be healing though they are quite macerated with a likely secondary to the antibiotic ointment.  Assessment: Well-healed matrixectomy hallux bilateral.  Plan: Discontinue antibiotic ointment but only continue the Cortisporin Otic drops after soaking once daily covered in the daytime and leave open at bedtime. Continue to soak until completely resolved.

## 2016-12-25 ENCOUNTER — Ambulatory Visit: Payer: Medicare Other | Admitting: Physical Medicine & Rehabilitation

## 2017-01-02 ENCOUNTER — Other Ambulatory Visit: Payer: Self-pay | Admitting: Gastroenterology

## 2017-01-04 ENCOUNTER — Encounter: Payer: Self-pay | Admitting: Podiatry

## 2017-01-04 ENCOUNTER — Ambulatory Visit (INDEPENDENT_AMBULATORY_CARE_PROVIDER_SITE_OTHER): Payer: Medicare Other | Admitting: Podiatry

## 2017-01-04 DIAGNOSIS — L600 Ingrowing nail: Secondary | ICD-10-CM | POA: Diagnosis not present

## 2017-01-04 NOTE — Progress Notes (Signed)
She presents today for follow-up of paronychia status post matrixectomy hallux bilateral. She states she's been soaking regularly and he seemed to be improving. She does relate a small injury to the tibial border of the hallux right where she stepped on her toe with her other foot by accident. She states it started bleeding.  Objective: Vital signs are stable she's alert and oriented 3 there is no erythema cellulitis drainage or odor the hallux left appears to be healing the best the right one does demonstrate a little gapping of the medial margin otherwise it appears to be healing quite nicely. No signs of infection.  Assessment: Continue Epsom salts and warm water soaks to completely heal the wounds. I recommended that she continue this for the next week at least once daily. I will follow up with her on an as-needed basis. Should this not going to heal in the next 2 weeks she signifies immediately.

## 2017-01-11 ENCOUNTER — Other Ambulatory Visit: Payer: Self-pay | Admitting: Adult Health

## 2017-01-11 NOTE — Telephone Encounter (Signed)
Ok to refill 

## 2017-01-19 ENCOUNTER — Encounter: Payer: Medicare Other | Attending: Physical Medicine & Rehabilitation

## 2017-01-19 ENCOUNTER — Ambulatory Visit: Payer: Medicare Other | Admitting: Physical Medicine & Rehabilitation

## 2017-01-19 DIAGNOSIS — Z820 Family history of epilepsy and other diseases of the nervous system: Secondary | ICD-10-CM | POA: Insufficient documentation

## 2017-01-19 DIAGNOSIS — R200 Anesthesia of skin: Secondary | ICD-10-CM | POA: Insufficient documentation

## 2017-01-19 DIAGNOSIS — G4700 Insomnia, unspecified: Secondary | ICD-10-CM | POA: Insufficient documentation

## 2017-01-19 DIAGNOSIS — M797 Fibromyalgia: Secondary | ICD-10-CM | POA: Insufficient documentation

## 2017-01-19 DIAGNOSIS — E7800 Pure hypercholesterolemia, unspecified: Secondary | ICD-10-CM | POA: Insufficient documentation

## 2017-01-19 DIAGNOSIS — R569 Unspecified convulsions: Secondary | ICD-10-CM | POA: Insufficient documentation

## 2017-01-19 DIAGNOSIS — F0781 Postconcussional syndrome: Secondary | ICD-10-CM | POA: Insufficient documentation

## 2017-01-19 DIAGNOSIS — G5603 Carpal tunnel syndrome, bilateral upper limbs: Secondary | ICD-10-CM | POA: Insufficient documentation

## 2017-01-19 DIAGNOSIS — K219 Gastro-esophageal reflux disease without esophagitis: Secondary | ICD-10-CM | POA: Insufficient documentation

## 2017-01-19 DIAGNOSIS — Z90710 Acquired absence of both cervix and uterus: Secondary | ICD-10-CM | POA: Insufficient documentation

## 2017-02-05 ENCOUNTER — Other Ambulatory Visit: Payer: Self-pay | Admitting: Adult Health

## 2017-02-07 ENCOUNTER — Other Ambulatory Visit: Payer: Self-pay | Admitting: Adult Health

## 2017-02-07 DIAGNOSIS — Z760 Encounter for issue of repeat prescription: Secondary | ICD-10-CM

## 2017-02-07 DIAGNOSIS — G4700 Insomnia, unspecified: Secondary | ICD-10-CM

## 2017-02-08 NOTE — Telephone Encounter (Signed)
Rx has been called.

## 2017-02-08 NOTE — Telephone Encounter (Signed)
Ok to refill 

## 2017-02-08 NOTE — Telephone Encounter (Signed)
This rx was last refilled 09/13/16 for #30 with 0 refills. Is this ok?

## 2017-02-19 ENCOUNTER — Ambulatory Visit (INDEPENDENT_AMBULATORY_CARE_PROVIDER_SITE_OTHER): Payer: Medicare Other | Admitting: Podiatry

## 2017-02-19 DIAGNOSIS — L03039 Cellulitis of unspecified toe: Secondary | ICD-10-CM

## 2017-02-19 DIAGNOSIS — L600 Ingrowing nail: Secondary | ICD-10-CM | POA: Diagnosis not present

## 2017-02-19 DIAGNOSIS — M79676 Pain in unspecified toe(s): Secondary | ICD-10-CM | POA: Diagnosis not present

## 2017-02-19 MED ORDER — DOXYCYCLINE HYCLATE 100 MG PO TABS: 100.0000 mg | ORAL_TABLET | Freq: Two times a day (BID) | ORAL | 0 refills | Status: DC

## 2017-02-19 NOTE — Progress Notes (Signed)
   Subjective: Patient presents today for evaluation of pain in toe(s). Patient is concerned for possible ingrown nail. Patient states that the pain has been present for a few weeks now. Patient presents today for further treatment and evaluation.  Objective:  General: Well developed, nourished, in no acute distress, alert and oriented x3   Dermatology: Skin is warm, dry and supple bilateral. Medial border of the left great toe appears to be erythematous with evidence of an ingrowing nail. Pain on palpation noted to the border of the nail fold. The remaining nails appear unremarkable at this time. There are no open sores, lesions.  Vascular: Dorsalis Pedis artery and Posterior Tibial artery pedal pulses palpable. No lower extremity edema noted.   Neruologic: Grossly intact via light touch bilateral.  Musculoskeletal: Muscular strength within normal limits in all groups bilateral. Normal range of motion noted to all pedal and ankle joints.   Assesement: #1 Paronychia with ingrowing nail medial border left great toe #2 Pain in toe #3 Incurvated nail  Plan of Care:  1. Patient evaluated.  2. Discussed treatment alternatives and plan of care. Explained nail avulsion procedure and post procedure course to patient. 3. Patient opted for permanent partial nail avulsion.  4. Prior to procedure, local anesthesia infiltration utilized using 3 ml of a 50:50 mixture of 2% plain lidocaine and 0.5% plain marcaine in a normal hallux block fashion and a betadine prep performed.  5. Partial permanent nail avulsion with chemical matrixectomy performed using [id number] applications of phenol followed by alcohol flush.  6. Light dressing applied. 7. Prescription for doxycycline 100 mg  8. Return to clinic in 2 weeks.   Edrick Kins, DPM [facility]  Dr. Edrick Kins, [address]                                        [place], [place] [address]                Office [phone]  Fax [phone]

## 2017-02-19 NOTE — Patient Instructions (Signed)

## 2017-03-07 ENCOUNTER — Ambulatory Visit (INDEPENDENT_AMBULATORY_CARE_PROVIDER_SITE_OTHER): Payer: Medicare Other | Admitting: Podiatry

## 2017-03-07 DIAGNOSIS — M79676 Pain in unspecified toe(s): Secondary | ICD-10-CM | POA: Diagnosis not present

## 2017-03-07 DIAGNOSIS — S91209D Unspecified open wound of unspecified toe(s) with damage to nail, subsequent encounter: Secondary | ICD-10-CM | POA: Diagnosis not present

## 2017-03-07 DIAGNOSIS — S91109D Unspecified open wound of unspecified toe(s) without damage to nail, subsequent encounter: Secondary | ICD-10-CM

## 2017-03-09 NOTE — Progress Notes (Signed)
   Subjective: Patient presents today 2 weeks post ingrown nail permanent nail avulsion procedure. Patient states that the toe and nail fold is feeling much better.  Objective: Skin is warm, dry and supple. Nail and respective nail fold appears to be healing appropriately. Open wound to the associated nail fold with a granular wound base and moderate amount of fibrotic tissue. Minimal drainage noted. Mild erythema around the periungual region likely due to phenol chemical matricectomy. Patient states the toe still appears dark. She states she has completed her course of doxycycline.  Assessment: #1 postop permanent partial nail avulsion of bilateral great toes #2 open wound periungual nail fold of respective digit.   Plan of care: #1 patient was evaluated  #2 debridement of open wound was performed to the periungual border of the respective toe using a currette. Antibiotic ointment and Band-Aid was applied. #3 patient is to return to clinic on a PRN  basis.   Edrick Kins, DPM [facility]  Dr. Edrick Kins, [address]                                        [place], [place] [address]                Office [phone]  Fax [phone]

## 2017-03-14 ENCOUNTER — Other Ambulatory Visit: Payer: Self-pay | Admitting: Family Medicine

## 2017-03-14 DIAGNOSIS — F39 Unspecified mood [affective] disorder: Secondary | ICD-10-CM

## 2017-03-14 MED ORDER — ESCITALOPRAM OXALATE 10 MG PO TABS: ORAL_TABLET | ORAL | 0 refills | Status: DC

## 2017-03-14 NOTE — Telephone Encounter (Signed)
CPX with [facility] 06/2016.  Filled for 1 year 03/2016.  Will fill for 90 days.  Due for cpx w/Cory 06/2017.

## 2017-03-23 ENCOUNTER — Encounter: Payer: Self-pay | Admitting: Family Medicine

## 2017-03-23 ENCOUNTER — Ambulatory Visit (INDEPENDENT_AMBULATORY_CARE_PROVIDER_SITE_OTHER): Payer: Medicare Other | Admitting: Family Medicine

## 2017-03-23 DIAGNOSIS — F310 Bipolar disorder, current episode hypomanic: Secondary | ICD-10-CM

## 2017-03-23 NOTE — Progress Notes (Signed)
Subjective:  Molly [name] is a 41 y.o. year old very pleasant female patient who presents for/with See problem oriented charting ROS- admits to stress, anxiety, depressed mood. Had prior chest pain on lexapro and lyrica but states this stopped when she came off of medication   Past Medical History-  Patient Active Problem List   Diagnosis Date Noted  . Bipolar disorder ([place]) 12/05/2013    Priority: High  . Insomnia 05/25/2016  . Memory loss 09/29/2015  . Pure hypercholesterolemia 06/30/2015  . Convulsion ([place]) 06/22/2015  . Cephalalgia 06/22/2015  . Chest pain at rest 06/22/2015    Class: Acute  . GERD (gastroesophageal reflux disease) 12/05/2013  . Fibromyalgia 12/05/2013  . Carpal tunnel syndrome 12/05/2013  . Osteoarthritis 12/05/2013  . Convulsions/seizures ([place]) 10/08/2013  . Migraine without aura 10/08/2013    Medications- reviewed and updated Current Outpatient Prescriptions  Medication Sig Dispense Refill  . ammonium lactate (LAC-HYDRIN) 12 % lotion APPLY TO DRY SKIN AS NEEDED 400 g 0  . ammonium lactate (LAC-HYDRIN) 12 % lotion Apply 1 application topically as needed for dry skin. 400 g 1  . aspirin EC 81 MG tablet Take 81 mg by mouth daily.    Marland Kitchen azelastine (OPTIVAR) 0.05 % ophthalmic solution Place 1 drop into both eyes 2 (two) times daily. 6 mL 12  . Biotin w/ Vitamins C & E (HAIR SKIN & NAILS GUMMIES PO) Take 1 tablet by mouth daily.    Marland Kitchen CALCIUM PO Take by mouth every other day.    . cetirizine (ZYRTEC) 10 MG tablet TAKE 1 TABLET (10 MG TOTAL) BY MOUTH DAILY. 90 tablet 3  . cholecalciferol (VITAMIN D) 1000 units tablet Take 1 tablet (1,000 Units total) by mouth daily. 120 tablet 0  . cyclobenzaprine (FLEXERIL) 10 MG tablet TAKE 1 TABLET BY MOUTH 3 TIMES A DAY AS NEEDED FOR SPASMS 30 tablet 1  . diclofenac (VOLTAREN) 75 MG EC tablet Take 1 tablet (75 mg total) by mouth 2 (two) times daily. 60 tablet 1  . econazole nitrate 1 % cream Apply topically daily. (Patient  taking differently: Apply 1 application topically daily. ) 85 g 5  . econazole nitrate 1 % cream Apply topically daily. 15 g 1  . EPIPEN 2-PAK 0.3 MG/0.3ML SOAJ injection INJECT 0.3ML INTO THE MUSCLE 2 Device 0  . escitalopram (LEXAPRO) 10 MG tablet TAKE 1 TABLET (10 MG TOTAL) BY MOUTH DAILY. 90 tablet 0  . esomeprazole (NEXIUM) 20 MG capsule TAKE 1 CAPSULE (20 MG TOTAL) BY MOUTH DAILY. 90 capsule 1  . ferrous sulfate 325 (65 FE) MG tablet Take 325 mg by mouth every other day.    . fluticasone (FLONASE) 50 MCG/ACT nasal spray PLACE 2 SPRAYS INTO BOTH NOSTRILS DAILY. 16 g 3  . fluticasone (FLONASE) 50 MCG/ACT nasal spray PLACE 2 SPRAYS INTO BOTH NOSTRILS DAILY. 16 g 3  . ibuprofen (ADVIL,MOTRIN) 800 MG tablet Take 1 tablet (800 mg total) by mouth every 8 (eight) hours as needed. 30 tablet 3  . ketoconazole (NIZORAL) 2 % cream Apply 1 application topically daily. 60 g 5  . linaclotide (LINZESS) 290 MCG CAPS capsule Take 1 capsule (290 mcg total) by mouth daily before breakfast. 90 capsule 1  . LINZESS 145 MCG CAPS capsule TAKE 1 CAPSULE (145 MCG TOTAL) BY MOUTH DAILY. 30 capsule 2  . NEOMYCIN-POLYMYXIN-HYDROCORTISONE (CORTISPORIN) 1 % SOLN otic solution Apply 1-2 drops to toe BID after soaking 10 mL 1  . NONFORMULARY OR COMPOUNDED ITEM [facility]: Antiinflammatory  cream - Diclofenac 3%, Baclofen 2%, Lidocaine 2%, apply 1-2 grams to affected 3-4 times daily. 120 each 2  . ondansetron (ZOFRAN) 4 MG tablet Take 1 tablet (4 mg total) by mouth every 8 (eight) hours as needed for nausea or vomiting. 20 tablet 0  . oxyCODONE-acetaminophen (PERCOCET) 10-325 MG tablet Take one tablet by mouth every six hours as needed for pain. 30 tablet 0  . polyethylene glycol (MIRALAX / GLYCOLAX) packet MIX CONTENTS OF 1 PACKET WITH 4 TO 8 OUNCES OF LIQUID AND DRINK EVERY DAY 90 packet 1  . pregabalin (LYRICA) 75 MG capsule Take 1 capsule (75 mg total) by mouth 2 (two) times daily. 60 capsule 0  . RABEprazole  (ACIPHEX) 20 MG tablet Take 1 tablet (20 mg total) by mouth daily. 30 tablet 2  . rizatriptan (MAXALT-MLT) 10 MG disintegrating tablet Take 1 tablet (10 mg total) by mouth 3 (three) times daily as needed for migraine. May repeat in 2 hours if needed 10 tablet 3  . topiramate (TOPAMAX) 100 MG tablet TAKE 1 TABLET (100 MG TOTAL) BY MOUTH 2 (TWO) TIMES DAILY. 180 tablet 3  . zolmitriptan (ZOMIG) 5 MG nasal solution Place 1 spray into the nose as needed for migraine. May repeat in 2 hours. Do not use more than 3 times a week. 1 Units 3  . zolpidem (AMBIEN) 10 MG tablet TAKE 1 TABLET AT BEDTIME 30 tablet 0   No current facility-administered medications for this visit.     Objective: BP 124/84 (BP Location: Left Arm, Patient Position: Sitting, Cuff Size: Normal)   Pulse 96   Temp 98.8 F (37.1 C) (Oral)   Ht 5\' 9"  (1.753 m)   Wt 153 lb 3.2 oz (69.5 kg)   SpO2 97%   BMI 22.62 kg/m  Gen: NAD, resting comfortably CV: RRR no murmurs rubs or gallops Lungs: CTAB no crackles, wheeze, rhonchi Ext: no edema Skin: warm, dry  Assessment/Plan:  Bipolar disorder (HCC) S: Patient with severe stress recently. Lost her father about 2 weeks ago and kicked her daughter out of the house the same day. Prior to that within a few days she stopped her lyrica and lexapro stating that they caused chest pain, migraines, attitude change. Stress at home caring for her other daughter who requires 2 in home nurses for ADHD, Seizures, incontinence. Patient was diagnosed with bipoloar in [place] years ago but states never hospitalized.   She states she has not slept in weeks. Cannot remember the last time she slept. Cannot sleep even with ambien. Stays up all night. Has been shopping and has filled up a room with things she states she doesn't need but also has returned some of these things at times.   PHQ9 of 25, GAD7 of 19. She admits to seeing shadows moving that are not there.   She admits to thoughts of hurting  herself each day but had never had a plan and able to firmly state she would never hurt herself on anyone else. Agrees to call 911 if this were to occur.  A/P: Bipolar disorder- has symptoms of severe depression but also with hypomanic symptoms (staying up all night). Her discontinuing her lexapro is actual fortunate as she should not be on this with her bipolar history and risks of developing mania. Also discussed case with PCP Dorothyann Peng, NP and he agrees with decision to discontinue. She stopped her lyrica as well. Prior symptoms of chest pain, migraines, attitude change stopped when she stopped medications. I  also advised her to stop [place].   I attempted to call behavioral health to consider admission for acute mania- since it is not clear that she is a threat to herself or others- she would not qualify for admission they tell me. I was in the process of considering options for medications to start for acute mania (though suspect this is more hypomania) in bipoloar when she instructs me she has to leave because her daughters bus broke down. She declines me sending in medication for her at present. She is aware of importance of very close follow up with behavioral health/psychiatry. She does agree to call 911 if suicidal thoughts become more invasive or if she develops a plan. She agrees to go to [facility] on [address] on Monday at 8 30. I will also send myself a message to check in on her Monday.     The duration of face-to-face time during this visit was greater than 30 minutes. Greater than 50% of this time was spent in counseling, explanation of diagnosis, planning of further management, and/or coordination of care including reasons for immediately calling 911, importance of follow up Monday, discussing case with PCP.    Return precautions advised.  Garret Reddish, MD

## 2017-03-23 NOTE — Assessment & Plan Note (Addendum)
S: Patient with severe stress recently. Lost her father about 2 weeks ago and kicked her daughter out of the house the same day. Prior to that within a few days she stopped her lyrica and lexapro stating that they caused chest pain, migraines, attitude change. Stress at home caring for her other daughter who requires 2 in home nurses for ADHD, Seizures, incontinence. Patient was diagnosed with bipoloar in [place] years ago but states never hospitalized.   She states she has not slept in weeks. Cannot remember the last time she slept. Cannot sleep even with ambien. Stays up all night. Has been shopping and has filled up a room with things she states she doesn't need but also has returned some of these things at times.   PHQ9 of 25, GAD7 of 19. She admits to seeing shadows moving that are not there.   She admits to thoughts of hurting herself each day but had never had a plan and able to firmly state she would never hurt herself on anyone else. Agrees to call 911 if this were to occur.  A/P: Bipolar disorder- has symptoms of severe depression but also with hypomanic symptoms (staying up all night). Her discontinuing her lexapro is actual fortunate as she should not be on this with her bipolar history and risks of developing mania. Also discussed case with PCP Dorothyann Peng, NP and he agrees with decision to discontinue. She stopped her lyrica as well. Prior symptoms of chest pain, migraines, attitude change stopped when she stopped medications. I also advised her to stop ambien.   I attempted to call behavioral health to consider admission for acute mania- since it is not clear that she is a threat to herself or others- she would not qualify for admission they tell me. I was in the process of considering options for medications to start for acute mania (though suspect this is more hypomania) in bipoloar when she instructs me she has to leave because her daughters bus broke down. She declines me sending in  medication for her at present. She is aware of importance of very close follow up with behavioral health/psychiatry. She does agree to call 911 if suicidal thoughts become more invasive or if she develops a plan. She agrees to go to [facility] on [address] on Monday at 8 30. I will also send myself a message to check in on her Monday.

## 2017-03-23 NOTE — Patient Instructions (Addendum)
If you have any thoughts of harming yourself- call 911 immediately  Stop ambien. Continue off lexapro.   Seek care on Monday at behavioral health - [facility]- physically show up at 8 30 AM- you are going to need a psychiatrists aide to help you feel better

## 2017-03-25 ENCOUNTER — Other Ambulatory Visit: Payer: Self-pay | Admitting: Physician Assistant

## 2017-03-26 ENCOUNTER — Telehealth: Payer: Self-pay | Admitting: Family Medicine

## 2017-03-26 NOTE — Telephone Encounter (Signed)
Attempted to call patient to see if she saw psychiatry at [facility] today. No voicemail box set up and she didn't pick up. Likely try back later in week.

## 2017-03-28 NOTE — Telephone Encounter (Signed)
Roselyn Reef- can you reach out to patient to see if she was seen at [facility] please?

## 2017-03-28 NOTE — Telephone Encounter (Signed)
Called but no answer and no voicemail box set up

## 2017-03-29 ENCOUNTER — Telehealth: Payer: Self-pay | Admitting: Gastroenterology

## 2017-03-29 NOTE — Telephone Encounter (Signed)
Called and unable to leave voicemail message as voicemail box has not been set up

## 2017-04-02 MED ORDER — LINACLOTIDE 145 MCG PO CAPS: 145.0000 ug | ORAL_CAPSULE | Freq: Every day | ORAL | 1 refills | Status: DC

## 2017-04-02 NOTE — Telephone Encounter (Signed)
Patient called requesting Linzess refill. She scheduled a return appointment so medication sent in until appointment. Linzess 145 mcg #30 with 1 refill.

## 2017-04-03 NOTE — Telephone Encounter (Signed)
We will have Molly [name] decide how to follow up from here. I think a letter is reasonable if he is agreeable to that.

## 2017-04-03 NOTE — Telephone Encounter (Signed)
Called but was unable to leave a voicemail message as the mail box has not been set up. This is 3 phone attempts. Would you like me to mail a letter to follow up?

## 2017-04-05 NOTE — Telephone Encounter (Signed)
A letter is fine.

## 2017-04-12 ENCOUNTER — Ambulatory Visit (INDEPENDENT_AMBULATORY_CARE_PROVIDER_SITE_OTHER): Payer: Medicare Other | Admitting: Podiatry

## 2017-04-12 ENCOUNTER — Encounter: Payer: Self-pay | Admitting: Podiatry

## 2017-04-12 DIAGNOSIS — B351 Tinea unguium: Secondary | ICD-10-CM | POA: Diagnosis not present

## 2017-04-12 DIAGNOSIS — L603 Nail dystrophy: Secondary | ICD-10-CM | POA: Diagnosis not present

## 2017-04-13 NOTE — Progress Notes (Signed)
She presents today for concern about dark and brittle nails to the hallux bilateral.  Objective: Vital signs are stable alert and oriented 3. Pulses are palpable. Toenails appear to be friable thicker and darker than previously noted. No surrounding paronychia and there does not appear to be any surrounding dermatitis.  Assessment: Nail dystrophy hallux bilateral cannot rule out onychomycosis.  Plan: Samples of the nail were taken today to be sent for pathologic evaluation will follow up with her once the report has come in.

## 2017-04-13 NOTE — Telephone Encounter (Signed)
Letter sent.

## 2017-04-18 DIAGNOSIS — J029 Acute pharyngitis, unspecified: Secondary | ICD-10-CM | POA: Diagnosis not present

## 2017-04-19 ENCOUNTER — Telehealth: Payer: Self-pay | Admitting: Podiatry

## 2017-04-19 NOTE — Telephone Encounter (Signed)
Called pt to schedule appt with Dr Milinda Pointer to duscuss fungal results and pt states she was told we would call her with the results. I offered her an appt on 6.14 and pt could not come.She would like a call

## 2017-04-19 NOTE — Telephone Encounter (Addendum)
-----   Message from Garrel Ridgel, [place] sent at 04/19/2017 11:57 AM EDT ----- Positive for fungus. See her at next scheduled appointment. I informed pt of Dr. Stephenie Acres review of fungal culture as +, and he would discuss her treatment at an appt. I transferred pt to schedulers.

## 2017-04-26 ENCOUNTER — Ambulatory Visit (INDEPENDENT_AMBULATORY_CARE_PROVIDER_SITE_OTHER): Payer: Medicare Other | Admitting: Podiatry

## 2017-04-26 ENCOUNTER — Encounter: Payer: Self-pay | Admitting: Podiatry

## 2017-04-26 DIAGNOSIS — L603 Nail dystrophy: Secondary | ICD-10-CM | POA: Diagnosis not present

## 2017-04-26 DIAGNOSIS — Z79899 Other long term (current) drug therapy: Secondary | ICD-10-CM | POA: Diagnosis not present

## 2017-04-26 MED ORDER — TERBINAFINE HCL 250 MG PO TABS: 250.0000 mg | ORAL_TABLET | Freq: Every day | ORAL | 0 refills | Status: DC

## 2017-04-26 NOTE — Patient Instructions (Signed)

## 2017-04-27 LAB — HEPATIC FUNCTION PANEL
ALK PHOS: 77 U/L (ref 33–115)
ALT: 12 U/L (ref 6–29)
AST: 15 U/L (ref 10–30)
Albumin: 4.4 g/dL (ref 3.6–5.1)
BILIRUBIN INDIRECT: 0.3 mg/dL (ref 0.2–1.2)
BILIRUBIN TOTAL: 0.4 mg/dL (ref 0.2–1.2)
Bilirubin, Direct: 0.1 mg/dL (ref <=0.2)
Total Protein: 7.1 g/dL (ref 6.1–8.1)

## 2017-04-29 NOTE — Progress Notes (Signed)
She presents today for pathology reports of her toenails.  Objective: Pathology demonstrates onychomycosis T rubra.  Assessment: Onychomycosis.  Plan: Discussed oral therapy topical therapy laser therapy with her. At this point she would like to try oral therapy there does not appear to be contraindications to this. We will start her on Lamisil as indicated by the pathology results. We will treat her for 120 days initially starting with 30 tablets. Request her liver profile and blood work paperwork was dispensed. I will follow up with her in 1 month. Another prescription for Lamisil be provided that month and another liver requisition will be requested.

## 2017-05-01 ENCOUNTER — Telehealth: Payer: Self-pay | Admitting: *Deleted

## 2017-05-01 NOTE — Telephone Encounter (Addendum)
-----   Message from Garrel Ridgel, [place] sent at 04/29/2017 10:43 AM EDT ----- Blood work looks great continue medication.05/01/2017-I informed pt of Dr. Stephenie Acres review of labs and orders. Transferred pt to schedule for 30 day appt.05/31/2017-Received request for refill of lamisil. Pt is scheduled to discuss refill of Lamisil therapy at 05/31/2017 appt. Return fax denying refill.

## 2017-05-08 ENCOUNTER — Other Ambulatory Visit: Payer: Self-pay | Admitting: Adult Health

## 2017-05-28 ENCOUNTER — Encounter (INDEPENDENT_AMBULATORY_CARE_PROVIDER_SITE_OTHER): Payer: Self-pay

## 2017-05-28 ENCOUNTER — Ambulatory Visit (INDEPENDENT_AMBULATORY_CARE_PROVIDER_SITE_OTHER): Payer: Medicare Other | Admitting: Gastroenterology

## 2017-05-28 ENCOUNTER — Encounter: Payer: Self-pay | Admitting: Gastroenterology

## 2017-05-28 VITALS — BP 112/64 | HR 84 | Ht 69.0 in | Wt 151.0 lb

## 2017-05-28 DIAGNOSIS — K219 Gastro-esophageal reflux disease without esophagitis: Secondary | ICD-10-CM | POA: Diagnosis not present

## 2017-05-28 DIAGNOSIS — K5909 Other constipation: Secondary | ICD-10-CM | POA: Diagnosis not present

## 2017-05-28 MED ORDER — LINACLOTIDE 290 MCG PO CAPS: 290.0000 ug | ORAL_CAPSULE | Freq: Every day | ORAL | 11 refills | Status: DC

## 2017-05-28 MED ORDER — POLYETHYLENE GLYCOL 3350 17 G PO PACK: PACK | ORAL | 11 refills | Status: DC

## 2017-05-28 MED ORDER — ESOMEPRAZOLE MAGNESIUM 20 MG PO CPDR: 20.0000 mg | DELAYED_RELEASE_CAPSULE | Freq: Every day | ORAL | 11 refills | Status: DC

## 2017-05-28 NOTE — Patient Instructions (Addendum)
Scripts for nexium 20mg  pills, one pill 20-30 min before dinner meal (new script 30 pills, 11 refills).  Linzess prescription (290 mcg pill, one pill once daily, disp 30 with 11 refills)  Miralax prescription: one dose once daily. (30 doses, 11 refills).  Return as needed.  Normal BMI (Body Mass Index- based on height and weight) is between 19 and 25. Your BMI today is Body mass index is 22.3 kg/m. Marland Kitchen Please consider follow up  regarding your BMI with your Primary Care Provider.
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
## 2017-05-29 ENCOUNTER — Telehealth: Payer: Self-pay | Admitting: Neurology

## 2017-05-29 NOTE — Telephone Encounter (Signed)
Pt last office visit was in November of 2016.  Her PCP has retired and she needs medication management.  I will move her appointment to a different date, but OK to send Rx until her appointment??

## 2017-05-29 NOTE — Telephone Encounter (Signed)
Called pt.  She was out of the house but will call me when she gets home about her medications and how she is taking them.  She did state that her Gabapentin was switched to Lyrica by Dr Dolphus Jenny and she will follow up with him about refills.  She also states that she will need refill for Zomig sent to pharmacy as well.  While pt was on the phone I rescheduled her appointment from December 24 to August 10.

## 2017-05-29 NOTE — Telephone Encounter (Signed)
The medications we can fill for her are the Topamax, Gabapentin, and Maxalt. Pls confirm how she has been taking them and send refills until her appt with me. Thanks

## 2017-05-29 NOTE — Telephone Encounter (Signed)
Pt called to verify appointment and to give me her medication directions.  As I answered the line, pt was being pulled over by the police.  She will call office back when she can.

## 2017-05-29 NOTE — Telephone Encounter (Signed)
Caller: Colletta [place]   Urgent? No  Reason for the call: This patient called to schedule a follow up with Dr. Delice Lesch. The next available is 11/05/17. Will she be able to get refills between now and 11/05/17?Her PCP has left and she needs the medication to be filled.  Please Advise. Thanks

## 2017-05-31 ENCOUNTER — Other Ambulatory Visit: Payer: Self-pay | Admitting: Adult Health

## 2017-05-31 ENCOUNTER — Encounter: Payer: Self-pay | Admitting: Podiatry

## 2017-05-31 ENCOUNTER — Other Ambulatory Visit: Payer: Self-pay | Admitting: Neurology

## 2017-05-31 ENCOUNTER — Ambulatory Visit (INDEPENDENT_AMBULATORY_CARE_PROVIDER_SITE_OTHER): Payer: Medicare Other | Admitting: Podiatry

## 2017-05-31 DIAGNOSIS — M797 Fibromyalgia: Secondary | ICD-10-CM

## 2017-05-31 DIAGNOSIS — L603 Nail dystrophy: Secondary | ICD-10-CM | POA: Diagnosis not present

## 2017-05-31 DIAGNOSIS — Z79899 Other long term (current) drug therapy: Secondary | ICD-10-CM | POA: Diagnosis not present

## 2017-05-31 MED ORDER — TERBINAFINE HCL 250 MG PO TABS: 250.0000 mg | ORAL_TABLET | Freq: Every day | ORAL | 0 refills | Status: DC

## 2017-05-31 NOTE — Progress Notes (Signed)
She presents today for follow-up of her nail fungus and having taken the first month worth of Lamisil. She denies any rashes or major side effects. States that sometimes it bothers her stomach. Noted a thrush on the back of her tongue for about a week when she first started which went on to resolve.  Objective: Vital signs are stable alert and oriented 3. Pulses are palpable. Neurologic sensorium is intact. No change in onychomycosis as of yet.  Assessment: Pain in limb secondary to onychomycosis long-term therapy with Lamisil.  Plan: Started her on her next 90 days of Lamisil and requested a liver profile. I will follow up with her once a liver profile is performed she come back abnormal notify her immediately.

## 2017-06-01 ENCOUNTER — Other Ambulatory Visit: Payer: Self-pay | Admitting: *Deleted

## 2017-06-01 ENCOUNTER — Ambulatory Visit (HOSPITAL_BASED_OUTPATIENT_CLINIC_OR_DEPARTMENT_OTHER): Payer: Medicare Other | Admitting: Physical Medicine & Rehabilitation

## 2017-06-01 ENCOUNTER — Encounter: Payer: Medicare Other | Attending: Physical Medicine & Rehabilitation

## 2017-06-01 ENCOUNTER — Encounter: Payer: Self-pay | Admitting: Physical Medicine & Rehabilitation

## 2017-06-01 ENCOUNTER — Telehealth: Payer: Self-pay | Admitting: Neurology

## 2017-06-01 VITALS — BP 115/79 | HR 89

## 2017-06-01 DIAGNOSIS — G5603 Carpal tunnel syndrome, bilateral upper limbs: Secondary | ICD-10-CM | POA: Diagnosis not present

## 2017-06-01 DIAGNOSIS — M797 Fibromyalgia: Secondary | ICD-10-CM | POA: Diagnosis not present

## 2017-06-01 DIAGNOSIS — Z825 Family history of asthma and other chronic lower respiratory diseases: Secondary | ICD-10-CM | POA: Diagnosis not present

## 2017-06-01 DIAGNOSIS — Z818 Family history of other mental and behavioral disorders: Secondary | ICD-10-CM | POA: Insufficient documentation

## 2017-06-01 DIAGNOSIS — E7800 Pure hypercholesterolemia, unspecified: Secondary | ICD-10-CM | POA: Insufficient documentation

## 2017-06-01 DIAGNOSIS — Z79899 Other long term (current) drug therapy: Secondary | ICD-10-CM | POA: Insufficient documentation

## 2017-06-01 DIAGNOSIS — G4700 Insomnia, unspecified: Secondary | ICD-10-CM | POA: Insufficient documentation

## 2017-06-01 DIAGNOSIS — K219 Gastro-esophageal reflux disease without esophagitis: Secondary | ICD-10-CM | POA: Diagnosis not present

## 2017-06-01 DIAGNOSIS — F319 Bipolar disorder, unspecified: Secondary | ICD-10-CM | POA: Diagnosis not present

## 2017-06-01 LAB — HEPATIC FUNCTION PANEL
ALBUMIN: 4.3 g/dL (ref 3.6–5.1)
ALT: 13 U/L (ref 6–29)
AST: 18 U/L (ref 10–30)
Alkaline Phosphatase: 83 U/L (ref 33–115)
Bilirubin, Direct: 0.1 mg/dL (ref <=0.2)
Indirect Bilirubin: 0.3 mg/dL (ref 0.2–1.2)
TOTAL PROTEIN: 7.3 g/dL (ref 6.1–8.1)
Total Bilirubin: 0.4 mg/dL (ref 0.2–1.2)

## 2017-06-01 MED ORDER — ZOLMITRIPTAN 5 MG NA SOLN: NASAL | 3 refills | Status: DC

## 2017-06-01 MED ORDER — DICLOFENAC SODIUM 1 % TD GEL: 2.0000 g | Freq: Four times a day (QID) | TRANSDERMAL | 1 refills | Status: DC

## 2017-06-01 MED ORDER — PREGABALIN 75 MG PO CAPS: 75.0000 mg | ORAL_CAPSULE | Freq: Two times a day (BID) | ORAL | 5 refills | Status: DC

## 2017-06-01 MED ORDER — RIZATRIPTAN BENZOATE 10 MG PO TBDP: ORAL_TABLET | ORAL | 3 refills | Status: DC

## 2017-06-01 NOTE — Telephone Encounter (Signed)
Spoke with patient and she states on Lyrica 75 mg BID. She does not think she needs refills of this medication or Topamax at this time. She will call when she needs refills of these.  [facility]alt [facility]. Patient is aware not to take at the same time.  Will keep appt and call if needed.

## 2017-06-01 NOTE — Telephone Encounter (Signed)
Pls send refills for the Topamax until her next follow-up. Pls let her know she should not be taking the Zomig and Maxalt at the same time, she can alternate, that is fine. Ok to send #10 of each. Pls ask how she is taking Lyrica and send Rx until her f/u. Thank you
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
## 2017-06-01 NOTE — Telephone Encounter (Signed)
Patient dropped by note that states the following:   Zomig - to use one spray/can repeat in two hours if needed. Not to use more than three times a week.  (currently out of meds)   Topamax 100 mg - one by mouth two times daily.   Rizatriptan - Maxalt MLT 10 mg 1 tablet as needed/can repeat dose two hours if needed.  (currently out of meds)   Dr. Read Drivers discontinued Gabapentin and put patient on Lyrica. She did not state the dosage of the Lyrica or how often she takes it.   She can be reached at [phone].

## 2017-06-01 NOTE — Patient Instructions (Addendum)
I think the primary doctor was worried about some medication interactions. You may discuss with neurologist, whether or not he needed to go back on the gabapentin since that is very similar to the Lyrica.

## 2017-06-01 NOTE — Telephone Encounter (Signed)
1.)  Nexium filled by gastro.  Not Cory.  Message sent to the pharmacy  2.)  Ambien last filled by [facility] 02/08/17 #30       [name], you last seen pt 07/12/16.  Was seen by Dr. Yong Channel on 03/23/17       No future appointment scheduled at this time.  Please advise.

## 2017-06-04 ENCOUNTER — Telehealth: Payer: Self-pay | Admitting: *Deleted

## 2017-06-04 NOTE — Telephone Encounter (Signed)
Called and spoke to the pt.  She agreed to CPX and have her scheduled for 06/26/17 @ PM.  Pt advised she will need to fast for 6-8 hours prior.  Pt agreed.  Medication called to the pharmacy and left on machine.

## 2017-06-04 NOTE — Telephone Encounter (Addendum)
-----   Message from Garrel Ridgel, [place] sent at 06/04/2017  7:05 AM EDT ----- Blood work looks perfect and may continue with medication. Unable to leave a message voicemail box is not set up.06/12/2017-Unable to leave a message, voicemail box is not set up yet.

## 2017-06-04 NOTE — Telephone Encounter (Signed)
She can have thirty days but needs to scheduled your yearly physical for more refills

## 2017-06-06 ENCOUNTER — Other Ambulatory Visit: Payer: Self-pay | Admitting: Gastroenterology

## 2017-06-09 ENCOUNTER — Other Ambulatory Visit: Payer: Self-pay | Admitting: Adult Health

## 2017-06-09 DIAGNOSIS — F39 Unspecified mood [affective] disorder: Secondary | ICD-10-CM

## 2017-06-12 NOTE — Telephone Encounter (Signed)
This medication has been discontinued.  Message sent to the pharmacy to take it off her medication list.

## 2017-06-12 NOTE — Telephone Encounter (Deleted)
-----   Message from Garrel Ridgel, [place] sent at 06/04/2017  7:05 AM EDT ----- Blood work looks perfect and may continue with medication.

## 2017-06-15 ENCOUNTER — Telehealth: Payer: Self-pay | Admitting: Neurology

## 2017-06-15 NOTE — Telephone Encounter (Signed)
Patient called she cannot get rid of her headache. Please call. [facility] [address]. She is needing a refill on her Migraine Medication. Please Advise. Thanks

## 2017-06-19 ENCOUNTER — Telehealth: Payer: Self-pay | Admitting: Neurology

## 2017-06-19 NOTE — Telephone Encounter (Signed)
Do you have paperwork? 

## 2017-06-19 NOTE — Telephone Encounter (Signed)
Patient called and needed to reschedule also she was checking on the status of her Paperwork from the pharmacy for her Zomig medication. It needs Dr. Amparo Bristol Signature. Please Advise. Thanks

## 2017-06-20 NOTE — Telephone Encounter (Signed)
Rx was denied multiple times.

## 2017-06-22 ENCOUNTER — Ambulatory Visit: Payer: Medicare Other | Admitting: Neurology

## 2017-06-26 ENCOUNTER — Encounter: Payer: Self-pay | Admitting: Adult Health

## 2017-06-26 ENCOUNTER — Ambulatory Visit (INDEPENDENT_AMBULATORY_CARE_PROVIDER_SITE_OTHER): Payer: Medicare Other | Admitting: Adult Health

## 2017-06-26 VITALS — BP 130/82 | Temp 98.3°F | Ht 69.0 in | Wt 148.0 lb

## 2017-06-26 DIAGNOSIS — E7800 Pure hypercholesterolemia, unspecified: Secondary | ICD-10-CM | POA: Diagnosis not present

## 2017-06-26 DIAGNOSIS — F310 Bipolar disorder, current episode hypomanic: Secondary | ICD-10-CM

## 2017-06-26 DIAGNOSIS — Z0000 Encounter for general adult medical examination without abnormal findings: Secondary | ICD-10-CM

## 2017-06-26 NOTE — Patient Instructions (Signed)
Please follow up for your blood work. You need to be fasting for 8 hours   Follow up with psychiatry   Follow up with me as needed
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
## 2017-06-27 ENCOUNTER — Telehealth: Payer: Self-pay | Admitting: Neurology

## 2017-06-27 NOTE — Telephone Encounter (Signed)
Patient states that she is going apple Zomig nasal spray denial and needs a letter from dr Delice Lesch please call

## 2017-07-02 ENCOUNTER — Ambulatory Visit (INDEPENDENT_AMBULATORY_CARE_PROVIDER_SITE_OTHER): Payer: Medicare Other | Admitting: Podiatry

## 2017-07-02 ENCOUNTER — Ambulatory Visit (INDEPENDENT_AMBULATORY_CARE_PROVIDER_SITE_OTHER): Payer: Medicare Other | Admitting: Neurology

## 2017-07-02 ENCOUNTER — Encounter: Payer: Self-pay | Admitting: Neurology

## 2017-07-02 ENCOUNTER — Ambulatory Visit (INDEPENDENT_AMBULATORY_CARE_PROVIDER_SITE_OTHER): Payer: Medicare Other

## 2017-07-02 VITALS — BP 122/74 | HR 75 | Ht 69.0 in | Wt 148.0 lb

## 2017-07-02 DIAGNOSIS — R519 Headache, unspecified: Secondary | ICD-10-CM

## 2017-07-02 DIAGNOSIS — R51 Headache: Secondary | ICD-10-CM | POA: Diagnosis not present

## 2017-07-02 DIAGNOSIS — G43019 Migraine without aura, intractable, without status migrainosus: Secondary | ICD-10-CM | POA: Diagnosis not present

## 2017-07-02 DIAGNOSIS — S92505A Nondisplaced unspecified fracture of left lesser toe(s), initial encounter for closed fracture: Secondary | ICD-10-CM | POA: Diagnosis not present

## 2017-07-02 DIAGNOSIS — F319 Bipolar disorder, unspecified: Secondary | ICD-10-CM | POA: Diagnosis not present

## 2017-07-02 DIAGNOSIS — R569 Unspecified convulsions: Secondary | ICD-10-CM | POA: Diagnosis not present

## 2017-07-02 DIAGNOSIS — M79674 Pain in right toe(s): Secondary | ICD-10-CM

## 2017-07-02 MED ORDER — HYDROCODONE-ACETAMINOPHEN 10-325 MG PO TABS: 1.0000 | ORAL_TABLET | Freq: Three times a day (TID) | ORAL | 0 refills | Status: DC | PRN

## 2017-07-02 MED ORDER — TOPIRAMATE 100 MG PO TABS: ORAL_TABLET | ORAL | 3 refills | Status: DC

## 2017-07-02 MED ORDER — MELOXICAM 15 MG PO TABS: 15.0000 mg | ORAL_TABLET | Freq: Every day | ORAL | 1 refills | Status: AC

## 2017-07-02 NOTE — Progress Notes (Signed)
   HPI: 41 year old female presents to the office today for an injury to the left fifth toe which occurred approximate 4 days prior. She states that she hit her toe on a stool and injured her toe and experienced significant pain and tenderness with swelling. Patient presents today for further treatment and evaluation. Patient is unable to ambulate without pain.   Physical Exam: General: The patient is alert and oriented x3 in no acute distress.  Dermatology: Skin is warm, dry and supple bilateral lower extremities. Negative for open lesions or macerations.  Vascular: Palpable pedal pulses bilaterally. No edema or erythema noted. Capillary refill within normal limits.  Neurological: Epicritic and protective threshold grossly intact bilaterally.   Musculoskeletal Exam: Range of motion within normal limits to all pedal and ankle joints bilateral. Muscle strength 5/5 in all groups bilateral.   Radiographic Exam:  Normal osseous mineralization. Joint spaces preserved. Nondisplaced, closed oblique fracture noted to the proximal phalanx of the fifth digit left foot. This fracture line appears very subtle. It is best visualized on AP view.   Assessment: 1. Fracture proximal phalanx fifth digit left foot, closed, nondisplaced   Plan of Care:  1. Patient was evaluated. X-rays reviewed 2. Explained the patient that we will simply observe the fracture to ensure healing. 3. Prescription for meloxicam 15 mg 4. Prescription for Vicodin 10/325 mg #30 5. The patient has a postoperative shoe at home. Patient should wear the postoperative shoe daily 4 weeks 6. Return to clinic in 4 weeks for follow-up x-ray   Edrick Kins, DPM [facility]  Dr. Edrick Kins, DPM    [address], [place] [address]                Office [phone]  Fax [phone]
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
## 2017-07-06 ENCOUNTER — Other Ambulatory Visit: Payer: Self-pay | Admitting: Adult Health

## 2017-07-06 NOTE — Telephone Encounter (Signed)
Called to the pharmacy and left on machine. 

## 2017-07-06 NOTE — Telephone Encounter (Signed)
Last filled on 06/04/2017 #30 Last seen on 06/26/2017 for yearly Please advise.  Thanks!!

## 2017-07-06 NOTE — Telephone Encounter (Signed)
Ok to refill 

## 2017-07-06 NOTE — Telephone Encounter (Signed)
Pt calling to check the status of the Rx Ambien would like to see if she can get it today    [facility].

## 2017-07-10 ENCOUNTER — Other Ambulatory Visit (INDEPENDENT_AMBULATORY_CARE_PROVIDER_SITE_OTHER): Payer: Medicare Other

## 2017-07-10 DIAGNOSIS — E7800 Pure hypercholesterolemia, unspecified: Secondary | ICD-10-CM | POA: Diagnosis not present

## 2017-07-10 DIAGNOSIS — Z0000 Encounter for general adult medical examination without abnormal findings: Secondary | ICD-10-CM

## 2017-07-10 LAB — BASIC METABOLIC PANEL
BUN: 13 mg/dL (ref 6–23)
CHLORIDE: 102 meq/L (ref 96–112)
CO2: 33 meq/L — AB (ref 19–32)
CREATININE: 1.05 mg/dL (ref 0.40–1.20)
Calcium: 9.3 mg/dL (ref 8.4–10.5)
GFR: 75.13 mL/min (ref >60.00)
GLUCOSE: 80 mg/dL (ref 70–99)
Potassium: 3.9 mEq/L (ref 3.5–5.1)
Sodium: 140 mEq/L (ref 135–145)

## 2017-07-10 LAB — HEPATIC FUNCTION PANEL
ALT: 13 U/L (ref 0–35)
AST: 14 U/L (ref 0–37)
Albumin: 4.2 g/dL (ref 3.5–5.2)
Alkaline Phosphatase: 65 U/L (ref 39–117)
BILIRUBIN TOTAL: 0.3 mg/dL (ref 0.2–1.2)
Bilirubin, Direct: 0.1 mg/dL (ref 0.0–0.3)
Total Protein: 6.9 g/dL (ref 6.0–8.3)

## 2017-07-10 LAB — CBC WITH DIFFERENTIAL/PLATELET
BASOS PCT: 0.3 % (ref 0.0–3.0)
Basophils Absolute: 0 10*3/uL (ref 0.0–0.1)
EOS ABS: 0 10*3/uL (ref 0.0–0.7)
Eosinophils Relative: 1.1 % (ref 0.0–5.0)
HCT: 33.7 % — ABNORMAL LOW (ref 36.0–46.0)
HEMOGLOBIN: 11.4 g/dL — AB (ref 12.0–15.0)
LYMPHS PCT: 35.6 % (ref 12.0–46.0)
Lymphs Abs: 1.4 10*3/uL (ref 0.7–4.0)
MCHC: 33.7 g/dL (ref 30.0–36.0)
MCV: 92 fl (ref 78.0–100.0)
Monocytes Absolute: 0.3 10*3/uL (ref 0.1–1.0)
Monocytes Relative: 7.4 % (ref 3.0–12.0)
NEUTROS ABS: 2.2 10*3/uL (ref 1.4–7.7)
Neutrophils Relative %: 55.6 % (ref 43.0–77.0)
Platelets: 206 10*3/uL (ref 150.0–400.0)
RBC: 3.66 Mil/uL — ABNORMAL LOW (ref 3.87–5.11)
RDW: 12.8 % (ref 11.5–15.5)
WBC: 4 10*3/uL (ref 4.0–10.5)

## 2017-07-10 LAB — LIPID PANEL
CHOLESTEROL: 237 mg/dL — AB (ref 0–200)
HDL: 51.3 mg/dL (ref >39.00)
LDL CALC: 171 mg/dL — AB (ref 0–99)
NonHDL: 186.18
TRIGLYCERIDES: 78 mg/dL (ref 0.0–149.0)
Total CHOL/HDL Ratio: 5
VLDL: 15.6 mg/dL (ref 0.0–40.0)

## 2017-07-10 LAB — TSH: TSH: 3.36 u[IU]/mL (ref 0.35–4.50)

## 2017-07-12 ENCOUNTER — Other Ambulatory Visit: Payer: Self-pay

## 2017-07-12 MED ORDER — ZOLMITRIPTAN 5 MG PO TABS: 5.0000 mg | ORAL_TABLET | ORAL | 0 refills | Status: DC | PRN

## 2017-07-19 ENCOUNTER — Telehealth: Payer: Self-pay | Admitting: Podiatry

## 2017-07-19 NOTE — Telephone Encounter (Signed)
I am supposed to come back next week and see Dr. Amalia Hailey for a follow up of my broken toe. I'm in a lot of pain and wanted to know what I can do at home to help.

## 2017-07-19 NOTE — Telephone Encounter (Signed)
I instructed pt to go into a stiff bottom shoe, ice, rest and buddy tape, and to take OTC antiinflammatories like advil if tolerated take as package instructs.

## 2017-07-25 ENCOUNTER — Telehealth: Payer: Self-pay | Admitting: Gastroenterology

## 2017-07-25 DIAGNOSIS — J029 Acute pharyngitis, unspecified: Secondary | ICD-10-CM | POA: Diagnosis not present

## 2017-07-25 NOTE — Telephone Encounter (Signed)
The pt was last seen in July for constipation.  Began to have LLQ pain that radiates to the back, lost 6 pounds this week.  Loss of appetite.  Early satiety.  Takes linzess 290 mcg daily.  Miralax as needed.  Last BM 3 days ago.  Very hard stool.  Pt was advised to take miralax on a schedule basis and use 3 caps today and call back on Friday or sooner if no response.

## 2017-07-30 ENCOUNTER — Encounter: Payer: Self-pay | Admitting: Podiatry

## 2017-07-30 ENCOUNTER — Ambulatory Visit (INDEPENDENT_AMBULATORY_CARE_PROVIDER_SITE_OTHER): Payer: Medicare Other

## 2017-07-30 ENCOUNTER — Other Ambulatory Visit: Payer: Self-pay | Admitting: Family Medicine

## 2017-07-30 ENCOUNTER — Ambulatory Visit (INDEPENDENT_AMBULATORY_CARE_PROVIDER_SITE_OTHER): Payer: Medicare Other | Admitting: Podiatry

## 2017-07-30 DIAGNOSIS — S92505A Nondisplaced unspecified fracture of left lesser toe(s), initial encounter for closed fracture: Secondary | ICD-10-CM

## 2017-07-30 DIAGNOSIS — S92535D Nondisplaced fracture of distal phalanx of left lesser toe(s), subsequent encounter for fracture with routine healing: Secondary | ICD-10-CM

## 2017-07-30 MED ORDER — HYDROCODONE-ACETAMINOPHEN 10-325 MG PO TABS: 1.0000 | ORAL_TABLET | Freq: Three times a day (TID) | ORAL | 0 refills | Status: DC | PRN

## 2017-07-30 MED ORDER — FLUTICASONE PROPIONATE 50 MCG/ACT NA SUSP: NASAL | 1 refills | Status: DC

## 2017-07-30 NOTE — Telephone Encounter (Signed)
Received a fax requesting a 90 day supply.  Pt had last cpx on 06/26/17.  Sent to the pharmacy by e-scribe for 6 months.

## 2017-07-31 ENCOUNTER — Telehealth: Payer: Self-pay | Admitting: Podiatry

## 2017-07-31 DIAGNOSIS — Z79899 Other long term (current) drug therapy: Secondary | ICD-10-CM

## 2017-07-31 NOTE — Telephone Encounter (Signed)
I called my GI doctor and was told to contact Dr. Stephenie Acres office to see if I can get another lab order to have my liver checked again where I'm on the fungus medication. Then when the results come in they can see what the next step is because I'm having pain in my side.

## 2017-07-31 NOTE — Telephone Encounter (Signed)
I informed pt of Dr. Stephenie Acres orders for the repeat hepatic function, and gave [place] and the lab address.

## 2017-07-31 NOTE — Addendum Note (Signed)
Addended by: Harriett Sine D on: 07/31/2017 04:12 PM   Modules accepted: Orders

## 2017-08-01 ENCOUNTER — Telehealth: Payer: Self-pay | Admitting: Podiatry

## 2017-08-01 DIAGNOSIS — Z79899 Other long term (current) drug therapy: Secondary | ICD-10-CM | POA: Diagnosis not present

## 2017-08-01 LAB — CBC WITH DIFFERENTIAL/PLATELET
Basophils Absolute: 8 cells/uL (ref 0–200)
Basophils Relative: 0.2 %
EOS PCT: 1.2 %
Eosinophils Absolute: 50 cells/uL (ref 15–500)
HCT: 33.3 % — ABNORMAL LOW (ref 35.0–45.0)
Hemoglobin: 11.2 g/dL — ABNORMAL LOW (ref 11.7–15.5)
Lymphs Abs: 1357 cells/uL (ref 850–3900)
MCH: 30 pg (ref 27.0–33.0)
MCHC: 33.6 g/dL (ref 32.0–36.0)
MCV: 89.3 fL (ref 80.0–100.0)
MPV: 9.7 fL (ref 7.5–12.5)
Monocytes Relative: 6.7 %
NEUTROS PCT: 59.6 %
Neutro Abs: 2503 cells/uL (ref 1500–7800)
PLATELETS: 204 10*3/uL (ref 140–400)
RBC: 3.73 10*6/uL — AB (ref 3.80–5.10)
RDW: 13 % (ref 11.0–15.0)
TOTAL LYMPHOCYTE: 32.3 %
WBC: 4.2 10*3/uL (ref 3.8–10.8)
WBCMIX: 281 {cells}/uL (ref 200–950)

## 2017-08-01 LAB — HEPATIC FUNCTION PANEL
AG Ratio: 1.8 (calc) (ref 1.0–2.5)
ALKALINE PHOSPHATASE (APISO): 67 U/L (ref 33–115)
ALT: 15 U/L (ref 6–29)
AST: 16 U/L (ref 10–30)
Albumin: 4.5 g/dL (ref 3.6–5.1)
BILIRUBIN DIRECT: 0.1 mg/dL (ref 0.0–0.2)
BILIRUBIN INDIRECT: 0.2 mg/dL (ref 0.2–1.2)
Globulin: 2.5 g/dL (calc) (ref 1.9–3.7)
Total Bilirubin: 0.3 mg/dL (ref 0.2–1.2)
Total Protein: 7 g/dL (ref 6.1–8.1)
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
## 2017-08-01 NOTE — Telephone Encounter (Signed)
I called pt with [place], in the [facility] at [facility]. Pt states she went some where, and they need her to fax the insurance information to them, but she doesn't know who and has lost the fax number.

## 2017-08-01 NOTE — Telephone Encounter (Signed)
The address I was given to get my blood drawn I have no idea where that's at. I need someone to call me to give me better directions. The number I was given no one answers and tried pulling it up on google maps but I cannot find anything.

## 2017-08-02 ENCOUNTER — Telehealth: Payer: Self-pay | Admitting: Gastroenterology

## 2017-08-02 ENCOUNTER — Telehealth: Payer: Self-pay | Admitting: *Deleted

## 2017-08-02 NOTE — Telephone Encounter (Signed)
08/01/17 labs done by Dr Milinda Pointer the pt would like you to review.  Please advise.

## 2017-08-02 NOTE — Telephone Encounter (Signed)
This is Molly [name] calling. I just missed a call from your office and I think it was in regards to my lab work. Please call me back at [phone]. Thank you.

## 2017-08-02 NOTE — Telephone Encounter (Addendum)
-----   Message from Garrel Ridgel, [place] sent at 08/02/2017  6:50 AM EDT ----- Liver looks great but still has anemia.  Please send this to her GI doctor that was questioning the abdominal pain. I informed pt of Dr. Stephenie Acres review of results. Pt states her GI doctor is Dr. Owens Loffler [phone]. Faxed latest hepatic function to Dr. Ardis Hughs.

## 2017-08-02 NOTE — Telephone Encounter (Signed)
Patient states she had labs done yesterday and she would like Dr.Jacobs to review them and decide what she needs to do next.

## 2017-08-02 NOTE — Telephone Encounter (Signed)
Left message on machine to call back  

## 2017-08-02 NOTE — Telephone Encounter (Signed)
She has mild normocytic anemia.  This has been going on for at least 3 years (Hb range 11-12).  She needs to complete 2 sets of FOBT cards, she needs an anemia workup (iron, TIBC, ferritin, B 12, folate).  She needs my next available ROV.

## 2017-08-02 NOTE — Telephone Encounter (Addendum)
-----   Message from Garrel Ridgel, [place] sent at 08/02/2017  6:50 AM EDT ----- Liver looks great but still has anemia.  Please send this to her GI doctor that was questioning the abdominal pain. Unable to leave a message voicemail box is not set up.

## 2017-08-07 ENCOUNTER — Other Ambulatory Visit: Payer: Self-pay | Admitting: Adult Health

## 2017-08-07 ENCOUNTER — Other Ambulatory Visit: Payer: Self-pay

## 2017-08-07 DIAGNOSIS — R110 Nausea: Secondary | ICD-10-CM

## 2017-08-07 DIAGNOSIS — R109 Unspecified abdominal pain: Secondary | ICD-10-CM

## 2017-08-07 DIAGNOSIS — K5909 Other constipation: Secondary | ICD-10-CM

## 2017-08-07 NOTE — Telephone Encounter (Signed)
Unable to reach pt, no voice mail. Letter mailed for the pt to have labs and stool cards have also been mailed to the pt.

## 2017-08-08 NOTE — Telephone Encounter (Signed)
Filled on 07/06/17 #30 Seen on 06/26/17 for CPX No future appointment scheduled Please advise.  Thanks!!

## 2017-08-09 NOTE — Telephone Encounter (Signed)
Called to the pharmacy and left on machine. 

## 2017-08-09 NOTE — Telephone Encounter (Signed)
Ok to refill for 30 days  

## 2017-08-23 ENCOUNTER — Other Ambulatory Visit (INDEPENDENT_AMBULATORY_CARE_PROVIDER_SITE_OTHER): Payer: Medicare Other

## 2017-08-23 DIAGNOSIS — K5909 Other constipation: Secondary | ICD-10-CM

## 2017-08-23 DIAGNOSIS — R109 Unspecified abdominal pain: Secondary | ICD-10-CM

## 2017-08-23 DIAGNOSIS — R110 Nausea: Secondary | ICD-10-CM | POA: Diagnosis not present

## 2017-08-23 LAB — HEMOCCULT SLIDES (X 3 CARDS)
Fecal Occult Blood: NEGATIVE
OCCULT 1: POSITIVE — AB
OCCULT 2: POSITIVE — AB
OCCULT 3: NEGATIVE
OCCULT 4: NEGATIVE
OCCULT 5: NEGATIVE

## 2017-08-24 ENCOUNTER — Ambulatory Visit: Payer: Medicare Other | Admitting: Neurology

## 2017-08-26 ENCOUNTER — Other Ambulatory Visit: Payer: Self-pay | Admitting: Adult Health

## 2017-08-27 ENCOUNTER — Ambulatory Visit (INDEPENDENT_AMBULATORY_CARE_PROVIDER_SITE_OTHER): Payer: Medicare Other | Admitting: Podiatry

## 2017-08-27 ENCOUNTER — Ambulatory Visit (INDEPENDENT_AMBULATORY_CARE_PROVIDER_SITE_OTHER): Payer: Medicare Other

## 2017-08-27 DIAGNOSIS — S92535D Nondisplaced fracture of distal phalanx of left lesser toe(s), subsequent encounter for fracture with routine healing: Secondary | ICD-10-CM | POA: Diagnosis not present

## 2017-08-28 ENCOUNTER — Other Ambulatory Visit (INDEPENDENT_AMBULATORY_CARE_PROVIDER_SITE_OTHER): Payer: Medicare Other

## 2017-08-28 DIAGNOSIS — K5909 Other constipation: Secondary | ICD-10-CM | POA: Diagnosis not present

## 2017-08-28 DIAGNOSIS — R110 Nausea: Secondary | ICD-10-CM | POA: Diagnosis not present

## 2017-08-28 DIAGNOSIS — R109 Unspecified abdominal pain: Secondary | ICD-10-CM | POA: Diagnosis not present

## 2017-08-28 LAB — HEMOCCULT SLIDES (X 3 CARDS)
Fecal Occult Blood: NEGATIVE
OCCULT 1: NEGATIVE
OCCULT 2: NEGATIVE
OCCULT 3: NEGATIVE
OCCULT 4: NEGATIVE
OCCULT 5: NEGATIVE

## 2017-08-28 NOTE — Telephone Encounter (Signed)
Sent to the pharmacy by e-scribe. 
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
## 2017-08-31 ENCOUNTER — Other Ambulatory Visit (INDEPENDENT_AMBULATORY_CARE_PROVIDER_SITE_OTHER): Payer: Medicare Other

## 2017-08-31 ENCOUNTER — Telehealth: Payer: Self-pay | Admitting: Gastroenterology

## 2017-08-31 DIAGNOSIS — K5909 Other constipation: Secondary | ICD-10-CM | POA: Diagnosis not present

## 2017-08-31 DIAGNOSIS — R110 Nausea: Secondary | ICD-10-CM

## 2017-08-31 DIAGNOSIS — R109 Unspecified abdominal pain: Secondary | ICD-10-CM

## 2017-08-31 LAB — FERRITIN: FERRITIN: 100 ng/mL (ref 10.0–291.0)

## 2017-08-31 LAB — IBC PANEL
IRON: 73 ug/dL (ref 42–145)
Saturation Ratios: 20.6 % (ref 20.0–50.0)
TRANSFERRIN: 253 mg/dL (ref 212.0–360.0)

## 2017-08-31 LAB — VITAMIN B12: VITAMIN B 12: 253 pg/mL (ref 211–911)

## 2017-08-31 LAB — FOLATE: Folate: 11.6 ng/mL (ref >5.9)

## 2017-08-31 NOTE — Telephone Encounter (Signed)
Patient states that she feels the rectal blood is coming from hemorrhoids, wants to know if she should have the hemorrhoidal tissue addressed first before having colonoscopy?

## 2017-09-04 NOTE — Telephone Encounter (Signed)
I spoke to patient and she will keep her appointment for the colonoscopy.

## 2017-09-04 NOTE — Telephone Encounter (Signed)
It's been 7 years since last colonosocpy, I think we should repeat since heme +.  She is probably right about he hemorrhoids but would not want to miss anything more serious. Will get a good look at hemorrhoids at time of colonoscopy and let her know I"ll send her for hemorrhoid banding, repair if needed.

## 2017-09-06 ENCOUNTER — Ambulatory Visit (INDEPENDENT_AMBULATORY_CARE_PROVIDER_SITE_OTHER): Payer: Medicare Other | Admitting: Physician Assistant

## 2017-09-06 ENCOUNTER — Encounter: Payer: Self-pay | Admitting: Physician Assistant

## 2017-09-06 ENCOUNTER — Other Ambulatory Visit (INDEPENDENT_AMBULATORY_CARE_PROVIDER_SITE_OTHER): Payer: Medicare Other

## 2017-09-06 ENCOUNTER — Ambulatory Visit (INDEPENDENT_AMBULATORY_CARE_PROVIDER_SITE_OTHER)
Admission: RE | Admit: 2017-09-06 | Discharge: 2017-09-06 | Disposition: A | Discharge status: Home / Self Care | Payer: Medicare Other | Source: Ambulatory Visit | Attending: Physician Assistant | Admitting: Physician Assistant

## 2017-09-06 ENCOUNTER — Telehealth: Payer: Self-pay | Admitting: Gastroenterology

## 2017-09-06 ENCOUNTER — Encounter (INDEPENDENT_AMBULATORY_CARE_PROVIDER_SITE_OTHER): Payer: Self-pay

## 2017-09-06 VITALS — BP 100/70 | HR 98 | Ht 65.0 in | Wt 145.0 lb

## 2017-09-06 DIAGNOSIS — R109 Unspecified abdominal pain: Secondary | ICD-10-CM

## 2017-09-06 DIAGNOSIS — R1012 Left upper quadrant pain: Secondary | ICD-10-CM | POA: Diagnosis not present

## 2017-09-06 DIAGNOSIS — K5909 Other constipation: Secondary | ICD-10-CM

## 2017-09-06 DIAGNOSIS — K5900 Constipation, unspecified: Secondary | ICD-10-CM | POA: Diagnosis not present

## 2017-09-06 DIAGNOSIS — R1032 Left lower quadrant pain: Secondary | ICD-10-CM | POA: Diagnosis not present

## 2017-09-06 LAB — CBC WITH DIFFERENTIAL/PLATELET
BASOS ABS: 0 10*3/uL (ref 0.0–0.1)
Basophils Relative: 0.5 % (ref 0.0–3.0)
EOS PCT: 1.3 % (ref 0.0–5.0)
Eosinophils Absolute: 0.1 10*3/uL (ref 0.0–0.7)
HEMATOCRIT: 35.2 % — AB (ref 36.0–46.0)
Hemoglobin: 11.9 g/dL — ABNORMAL LOW (ref 12.0–15.0)
LYMPHS PCT: 33 % (ref 12.0–46.0)
Lymphs Abs: 1.4 10*3/uL (ref 0.7–4.0)
MCHC: 33.8 g/dL (ref 30.0–36.0)
MCV: 91.9 fl (ref 78.0–100.0)
MONOS PCT: 7.2 % (ref 3.0–12.0)
Monocytes Absolute: 0.3 10*3/uL (ref 0.1–1.0)
NEUTROS ABS: 2.5 10*3/uL (ref 1.4–7.7)
Neutrophils Relative %: 58 % (ref 43.0–77.0)
Platelets: 229 10*3/uL (ref 150.0–400.0)
RBC: 3.83 Mil/uL — ABNORMAL LOW (ref 3.87–5.11)
RDW: 12.6 % (ref 11.5–15.5)
WBC: 4.3 10*3/uL (ref 4.0–10.5)

## 2017-09-06 LAB — BASIC METABOLIC PANEL
BUN: 11 mg/dL (ref 6–23)
CALCIUM: 9.9 mg/dL (ref 8.4–10.5)
CO2: 31 meq/L (ref 19–32)
CREATININE: 0.96 mg/dL (ref 0.40–1.20)
Chloride: 101 mEq/L (ref 96–112)
GFR: 83.25 mL/min (ref >60.00)
GLUCOSE: 79 mg/dL (ref 70–99)
Potassium: 3.8 mEq/L (ref 3.5–5.1)
Sodium: 139 mEq/L (ref 135–145)

## 2017-09-06 MED ORDER — HYDROCODONE-ACETAMINOPHEN 5-325 MG PO TABS: 1.0000 | ORAL_TABLET | Freq: Four times a day (QID) | ORAL | 0 refills | Status: DC | PRN

## 2017-09-06 MED ORDER — IOPAMIDOL (ISOVUE-300) INJECTION 61%
100.0000 mL | Freq: Once | INTRAVENOUS | Status: AC | PRN
  Administered 2017-09-06: 100 mL via INTRAVENOUS

## 2017-09-06 NOTE — Progress Notes (Signed)
Subjective:    Patient ID: Molly [name], female    DOB: 1976/07/31, 41 y.o.   MRN: [id number]  HPI Molly [name] is a 41 year old African-American female, known to Dr. Christella Hartigan who has been seen for chronic constipation. She has been maintained on Linzess. She comes in today as an acute work in for acute onset of left upper quadrant pain radiating into her left flank and back. She says she has noticed some left-sided abdominal discomfort off and on over the past 2 weeks that this became severe last evening and she was unable to sleep. She says she stayed in bed sitting up. She complains of increased discomfort with very deep breaths or with bending over. She had some sweats last night, no fever was nauseated but without vomiting, she's not had any change in her bowel habits melena or hematochezia. She says she's been alternating between Linzess 145 g daily and 290 g daily and generally  has a bowel movement every 2-3 days. She's also been taking MiraLAX one dose at bedtime. She denies any injury heavy lifting etc., she is status post hysterectomy. She is also dealing with a broken foot and is wearing a boot. No shortness of breath, no dysuria, no prior history of ureteral lithiasis. She says his pain feels different than her constipation.  Review of Systems Pertinent positive and negative review of systems were noted in the above HPI section.  All other review of systems was otherwise negative.  Outpatient Encounter Prescriptions as of 09/06/2017  Medication Sig  . ammonium lactate (LAC-HYDRIN) 12 % lotion Apply 1 application topically as needed for dry skin.  Marland Kitchen aspirin EC 81 MG tablet Take 81 mg by mouth daily.  Marland Kitchen azelastine (OPTIVAR) 0.05 % ophthalmic solution Place 1 drop into both eyes 2 (two) times daily.  . Biotin w/ Vitamins C & E (HAIR SKIN & NAILS GUMMIES PO) Take 1 tablet by mouth daily.  Marland Kitchen CALCIUM PO Take by mouth every other day.  . cetirizine (ZYRTEC) 10 MG tablet TAKE 1 TABLET  (10 MG TOTAL) BY MOUTH DAILY.  . cholecalciferol (VITAMIN D) 1000 units tablet Take 1 tablet (1,000 Units total) by mouth daily.  . cyclobenzaprine (FLEXERIL) 10 MG tablet TAKE 1 TABLET BY MOUTH 3 TIMES A DAY AS NEEDED FOR SPASMS  . diclofenac (VOLTAREN) 75 MG EC tablet Take 1 tablet (75 mg total) by mouth 2 (two) times daily.  . diclofenac sodium (VOLTAREN) 1 % GEL Apply 2 g topically 4 (four) times daily.  Marland Kitchen econazole nitrate 1 % cream Apply topically daily.  Marland Kitchen EPIPEN 2-PAK 0.3 MG/0.3ML SOAJ injection INJECT 0.3ML INTO THE MUSCLE  . esomeprazole (NEXIUM) 20 MG capsule Take 1 capsule (20 mg total) by mouth daily.  . fluticasone (FLONASE) 50 MCG/ACT nasal spray PLACE 2 SPRAYS INTO BOTH NOSTRILS DAILY.  Marland Kitchen ketoconazole (NIZORAL) 2 % cream Apply 1 application topically daily.  Marland Kitchen linaclotide (LINZESS) 290 MCG CAPS capsule Take 1 capsule (290 mcg total) by mouth daily before breakfast.  . LINZESS 145 MCG CAPS capsule TAKE ONE CAPSULE BY MOUTH EVERY DAY  . NEOMYCIN-POLYMYXIN-HYDROCORTISONE (CORTISPORIN) 1 % SOLN otic solution Apply 1-2 drops to toe BID after soaking  . NONFORMULARY OR COMPOUNDED ITEM [facility]: Antiinflammatory cream - Diclofenac 3%, Baclofen 2%, Lidocaine 2%, apply 1-2 grams to affected 3-4 times daily.  . polyethylene glycol (MIRALAX / GLYCOLAX) packet MIX CONTENTS OF 1 PACKET WITH 4 TO 8 OUNCES OF LIQUID AND DRINK EVERY DAY  . pregabalin (LYRICA) 75 MG  capsule Take 1 capsule (75 mg total) by mouth 2 (two) times daily.  Marland Kitchen PREVIDENT 5000 SENSITIVE 1.1-5 % PSTE   . RABEprazole (ACIPHEX) 20 MG tablet Take 1 tablet (20 mg total) by mouth daily.  . rizatriptan (MAXALT-MLT) 10 MG disintegrating tablet Take one tablet by mouth. May repeat in 2 hours if needed. Do not exceed three times week.  . topiramate (TOPAMAX) 100 MG tablet Take 1.5 tablet twice day  . zolmitriptan (ZOMIG) 5 MG nasal solution May repeat in 2 hours. Do not use more than 3 times a week.  . zolpidem (AMBIEN) 10  MG tablet TAKE 1 TABLET BY MOUTH AT BEDTIME  . HYDROcodone-acetaminophen (NORCO/VICODIN) 5-325 MG tablet Take 1 tablet by mouth every 6 (six) hours as needed for moderate pain.  . [DISCONTINUED] ferrous sulfate 325 (65 FE) MG tablet Take 325 mg by mouth every other day.  . [DISCONTINUED] HYDROcodone-acetaminophen (NORCO) 10-325 MG tablet Take 1 tablet by mouth every 8 (eight) hours as needed.  . [DISCONTINUED] terbinafine (LAMISIL) 250 MG tablet Take 1 tablet (250 mg total) by mouth daily.  . [DISCONTINUED] zolmitriptan (ZOMIG) 5 MG tablet Take 1 tablet (5 mg total) by mouth as needed for migraine.   No facility-administered encounter medications on file as of 09/06/2017.    Allergies  Allergen Reactions  . Fish Allergy Anaphylaxis, Hives and Swelling    Throat closes  . Heparin Itching  . Doxycycline Nausea And Vomiting  . Phenergan [Promethazine Hcl] Swelling  . Toradol [Ketorolac Tromethamine] Hives   Patient Active Problem List   Diagnosis Date Noted  . Insomnia 05/25/2016  . Memory loss 09/29/2015  . Pure hypercholesterolemia 06/30/2015  . Convulsion (HCC) 06/22/2015  . Cephalalgia 06/22/2015  . Chest pain at rest 06/22/2015    Class: Acute  . Bipolar disorder (HCC) 12/05/2013  . GERD (gastroesophageal reflux disease) 12/05/2013  . Fibromyalgia 12/05/2013  . Carpal tunnel syndrome 12/05/2013  . Osteoarthritis 12/05/2013  . Convulsions/seizures (HCC) 10/08/2013  . Migraine without aura 10/08/2013   Social History   Social History  . Marital status: Legally Separated    Spouse name: N/A  . Number of children: 2  . Years of education: 12+   Occupational History  . unemployed    Social History Main Topics  . Smoking status: Never Smoker  . Smokeless tobacco: Never Used  . Alcohol use No  . Drug use: No  . Sexual activity: Not on file   Other Topics Concern  . Not on file   Social History Narrative   Is not currently working   Patient lives at home.     Patient has 2 children.    Patient has a college education.    Patient is right handed.     [name] family history includes Bipolar disorder in her sister; Emphysema in her father; Seizures in her maternal grandmother, mother, and sister.      Objective:    Vitals:   09/06/17 1124  BP: 100/70  Pulse: 98    Physical Exam well-developed young African-American female in no acute distress, blood pressure 100/70 pulse 98, BMI 24.1. HEENT; nontraumatic normocephalic EOMI PERRLA sclera anicteric, Cardiovascular; regular rate and rhythm with S1-S2 no murmur or gallop, Pulmonary ;clear bilaterally, she is nontender to palpation of the left lower posterior ribs, there is no rash or lesions, Abdomen; soft, nondistended bowel sounds are present no palpable mass or hepatosplenomegaly she's tender in the epigastrium and left upper quadrant and also along the left  costal margin and left lower anterior ribs, tearful with exam  Rectal ;exam not done, Ext; no clubbing cyanosis or edema skin warm and dry, Neuropsych ;mood and affect appropriate       Assessment & Plan:   #31 41 year old African-American female with 2 week history of intermittent left upper quadrant pain radiating into the left back and flank with acute increase in intensity over the past 18 hours, pain is increased by bending over or deep breaths. Etiology of pain is not clear, this may be of musculoskeletal etiology, held however cannot rule out acute upper abdominal intra-abdominal inflammatory process. #2 history of chronic constipation, maintained on Linzess 3 status post hysterectomy #4 left great toe fracture current #5 bipolar disorder #6 fibromyalgia  Plan; CBC with differential, BMET, Schedule for CT of the abdomen and pelvis to be done within the next 24 hours. Advised heating pad to the left abdomen, lidocaine patches every 12 hours over area of pain. Hydrocodone 3/25 one by mouth every 6 hours when necessary for  pain #25 and no refills further plans pending results of labs and CT. Continue Linzess 145 g daily alternating with 290 mcg daily and qhs MiraLAX   Perkins Molina S Desree Leap PA-C 09/06/2017   Cc: Shirline Frees, NP

## 2017-09-06 NOTE — Patient Instructions (Addendum)
Please go to the basement level to have your labs drawn.   USe heating pad on left side. Ry over the ocunter lidocaine patches 12 hours per day over area of pain.  We have given you a prescription for Hydrocodone 5/325 mg.   You have been scheduled for a CT scan of the abdomen and pelvis at [facility] ([address]---this is in the same building as [facility]).   You are scheduled today  at 3:30 PM. You should arrive at 3:15 PM  for registration. Please follow the written instructions below on the day of your exam:  WARNING: IF YOU ARE ALLERGIC TO IODINE/X-RAY DYE, PLEASE NOTIFY RADIOLOGY IMMEDIATELY AT [phone]! YOU WILL BE GIVEN A 13 HOUR PREMEDICATION PREP.  1) Do not eat  anything after 11:30 am (4 hours prior to your test) 2) You have been given 2 bottles of oral contrast to drink. The solution may taste better if refrigerated, but do NOT add ice or any other liquid to this solution. Shake   well before drinking.    Drink 1 bottle of contrast @ 1:30 PM (2 hours prior to your exam)  Drink 1 bottle of contrast @ 2:30 PM (1 hour prior to your exam)  You may take any medications as prescribed with a small amount of water except for the following: Metformin, Glucophage, Glucovance, Avandamet, Riomet, Fortamet, Actoplus Met, Janumet, Glumetza or Metaglip. The above medications must be held the day of the exam AND 48 hours after the exam.  The purpose of you drinking the oral contrast is to aid in the visualization of your intestinal tract. The contrast solution may cause some diarrhea. Before your exam is started, you will be given a small amount of fluid to drink. Depending on your individual set of symptoms, you may also receive an intravenous injection of x-ray contrast/dye. Plan on being at [facility] for 30 minutes or long, depending on the type of exam you are having performed.  If you have any questions regarding your exam or if you need to  reschedule, you may call the CT department at [phone] between the hours of 8:00 am and 5:00 pm, Monday-Friday.  ________________________________________________________________________

## 2017-09-06 NOTE — Telephone Encounter (Signed)
Patient reports left sided pain that is keeping her from sleeping.  She is also c/o constipation despite miralax and linzess.  She will come in this am and see Amy Esterwood PA at 11:00

## 2017-09-06 NOTE — Progress Notes (Signed)
I agree with the above note, plan 

## 2017-09-07 ENCOUNTER — Telehealth: Payer: Self-pay | Admitting: *Deleted

## 2017-09-07 ENCOUNTER — Telehealth: Payer: Self-pay | Admitting: Physician Assistant

## 2017-09-07 NOTE — Telephone Encounter (Signed)
Reviewed the results with the patient. She will take the Miralax purge.

## 2017-09-07 NOTE — Telephone Encounter (Signed)
Patient seen by Nicoletta Ba for abdominal pain. Labs are in and commented on. I was going to call her today. Would you review her CT for her please? Route it to me and I can take care of it also. Thank you.

## 2017-09-07 NOTE — Telephone Encounter (Signed)
Pt called back and states she has done the miralax purge and taken the linzess, reports she did this at 1:30pm. Pt states nothing has happened and she is in a lot of pain, feels jittery and like she is going to throw up. Pt wants to know if she should go to the ER. Discussed with pt that if she is in that much pain then she should go to the ER. Pt verbalized understanding.

## 2017-09-07 NOTE — Telephone Encounter (Signed)
Pretty unrevealing CT except for large stool burden.  Her pains may be from constipation.  She needs to be really careful about the narcotic pain medicines as they can certainly worsen constipation.  I recommend a MiraLAX, gatorade purge if you could please go over that with her.  She should continue with plans for colonoscopy which I believe is already scheduled for the near future.

## 2017-09-07 NOTE — Telephone Encounter (Signed)
Refill request terbinafine. Pt had received #90 05/30/2017, that is therapeutic dosing. Return refill denied.

## 2017-09-10 ENCOUNTER — Other Ambulatory Visit: Payer: Self-pay | Admitting: Adult Health

## 2017-09-11 ENCOUNTER — Telehealth: Payer: Self-pay | Admitting: *Deleted

## 2017-09-11 NOTE — Telephone Encounter (Signed)
Dr Ardis Hughs,  This pt has known seizures. Per her last neuro note, her last documented seizure was 06-30-2017. She is on Topamax BID.  She last saw Amy 09-06-2017 and she is scheduled for a colon with you 11-9, Friday for heme positive stools and LUQ abd pain.  Is it okay to proceed in the [facility] as scheduled ?  Please advise, and thank you for your time,  Marijean Niemann

## 2017-09-12 NOTE — Telephone Encounter (Signed)
Ok to refill for 30 days  

## 2017-09-12 NOTE — Telephone Encounter (Signed)
Last filled on 08/09/17 #30.

## 2017-09-12 NOTE — Telephone Encounter (Signed)
Called to the pharmacy and left on machine. 

## 2017-09-13 NOTE — Telephone Encounter (Signed)
[name] and Dr. Ardis Hughs,  I have review this pt's chart and have determined she qualifies for care at [facility].  Thanks,  Osvaldo Angst

## 2017-09-13 NOTE — Telephone Encounter (Signed)
Thanks. Will Wait to hear from [place].

## 2017-09-13 NOTE — Telephone Encounter (Signed)
I'm OK with proceeding. Will forward to Osvaldo Angst for his opinion.

## 2017-09-13 NOTE — Telephone Encounter (Signed)
Thank you Molly [name]. Will proceed as scheduled. [name] PV

## 2017-09-14 ENCOUNTER — Ambulatory Visit (AMBULATORY_SURGERY_CENTER): Payer: Self-pay

## 2017-09-14 VITALS — Ht 69.0 in | Wt 143.0 lb

## 2017-09-14 DIAGNOSIS — R1012 Left upper quadrant pain: Secondary | ICD-10-CM

## 2017-09-14 MED ORDER — NA SULFATE-K SULFATE-MG SULF 17.5-3.13-1.6 GM/177ML PO SOLN: 1.0000 | Freq: Once | ORAL | 0 refills | Status: AC

## 2017-09-14 NOTE — Progress Notes (Signed)
Denies allergies to eggs or soy products. Denies complication of anesthesia or sedation. Denies use of weight loss medication. Denies use of O2.   Emmi instructions declined.  

## 2017-09-21 ENCOUNTER — Telehealth: Payer: Self-pay | Admitting: *Deleted

## 2017-09-21 ENCOUNTER — Encounter: Payer: Self-pay | Admitting: Gastroenterology

## 2017-09-21 ENCOUNTER — Other Ambulatory Visit: Payer: Self-pay

## 2017-09-21 ENCOUNTER — Ambulatory Visit (AMBULATORY_SURGERY_CENTER): Payer: Medicare Other | Admitting: Gastroenterology

## 2017-09-21 VITALS — BP 130/76 | HR 81 | Temp 97.5°F | Resp 19 | Ht 69.0 in | Wt 143.0 lb

## 2017-09-21 DIAGNOSIS — R195 Other fecal abnormalities: Secondary | ICD-10-CM

## 2017-09-21 DIAGNOSIS — K219 Gastro-esophageal reflux disease without esophagitis: Secondary | ICD-10-CM | POA: Diagnosis not present

## 2017-09-21 DIAGNOSIS — K649 Unspecified hemorrhoids: Secondary | ICD-10-CM | POA: Diagnosis not present

## 2017-09-21 DIAGNOSIS — F419 Anxiety disorder, unspecified: Secondary | ICD-10-CM | POA: Diagnosis not present

## 2017-09-21 DIAGNOSIS — K5909 Other constipation: Secondary | ICD-10-CM

## 2017-09-21 DIAGNOSIS — R569 Unspecified convulsions: Secondary | ICD-10-CM | POA: Diagnosis not present

## 2017-09-21 DIAGNOSIS — M797 Fibromyalgia: Secondary | ICD-10-CM | POA: Diagnosis not present

## 2017-09-21 DIAGNOSIS — F319 Bipolar disorder, unspecified: Secondary | ICD-10-CM | POA: Diagnosis not present

## 2017-09-21 MED ORDER — SODIUM CHLORIDE 0.9 % IV SOLN: 500.0000 mL | INTRAVENOUS | Status: DC

## 2017-09-21 NOTE — Op Note (Signed)
[facility] Patient Name: Molly [name] Procedure Date: 09/21/2017 3:13 PM MRN: [id number] Endoscopist: Milus Banister , MD Age: 41 Referring MD:  Date of Birth: 1976-07-05 Gender: Female Account #: [id number] Procedure:                Colonoscopy Indications:              Abdominal pain in the left upper quadrant, Heme                            positive stool, Constipation Medicines:                Monitored Anesthesia Care Procedure:                Pre-Anesthesia Assessment:                           - Prior to the procedure, a History and Physical                            was performed, and patient medications and                            allergies were reviewed. The patient's tolerance of                            previous anesthesia was also reviewed. The risks                            and benefits of the procedure and the sedation                            options and risks were discussed with the patient.                            All questions were answered, and informed consent                            was obtained. Prior Anticoagulants: The patient has                            taken no previous anticoagulant or antiplatelet                            agents. ASA Grade Assessment: II - A patient with                            mild systemic disease. After reviewing the risks                            and benefits, the patient was deemed in                            satisfactory condition to undergo the procedure.  After obtaining informed consent, the colonoscope                            was passed under direct vision. Throughout the                            procedure, the patient's blood pressure, pulse, and                            oxygen saturations were monitored continuously. The                            Colonoscope was introduced through the anus and                            advanced to the the cecum,  identified by                            appendiceal orifice and ileocecal valve. The                            colonoscopy was performed without difficulty. The                            patient tolerated the procedure well. The quality                            of the bowel preparation was good. The ileocecal                            valve, appendiceal orifice, and rectum were                            photographed. Scope In: 4:14:24 PM Scope Out: 4:23:59 PM Scope Withdrawal Time: 0 hours 5 minutes 2 seconds  Total Procedure Duration: 0 hours 9 minutes 35 seconds  Findings:                 The entire examined colon appeared normal.                           Small to medium sized internal hemorrhoids and                            small external hemorrhoids. Complications:            No immediate complications. Estimated blood loss:                            None. Estimated Blood Loss:     Estimated blood loss: none. Impression:               - The entire examined colon is normal.                           - External and internal hemorrhoids.                           -  No polyps or cancer. Recommendation:           - Repeat colonoscopy at age 86 for screening                            purposes.                           - My office will arrange referral for hemorrhoidal                            banding (prolapsing internal hemorrhoids, small                            external hemorrhoids).                           - continue on your current bowel regimen (linzess                            daily, miralax daily).                           - Patient has a contact number available for                            emergencies. The signs and symptoms of potential                            delayed complications were discussed with the                            patient. Return to normal activities tomorrow.                            Written discharge instructions were provided  to the                            patient.                           - Resume previous diet. Milus Banister, MD 09/21/2017 4:28:47 PM This report has been signed electronically.

## 2017-09-21 NOTE — Patient Instructions (Signed)
Impression/recommendations:  Hemorrhoids (handout given)  YOU HAD AN ENDOSCOPIC PROCEDURE TODAY AT [facility]:   Refer to the procedure report that was given to you for any specific questions about what was found during the examination.  If the procedure report does not answer your questions, please call your gastroenterologist to clarify.  If you requested that your care partner not be given the details of your procedure findings, then the procedure report has been included in a sealed envelope for you to review at your convenience later.  YOU SHOULD EXPECT: Some feelings of bloating in the abdomen. Passage of more gas than usual.  Walking can help get rid of the air that was put into your GI tract during the procedure and reduce the bloating. If you had a lower endoscopy (such as a colonoscopy or flexible sigmoidoscopy) you may notice spotting of blood in your stool or on the toilet paper. If you underwent a bowel prep for your procedure, you may not have a normal bowel movement for a few days.  Please Note:  You might notice some irritation and congestion in your nose or some drainage.  This is from the oxygen used during your procedure.  There is no need for concern and it should clear up in a day or so.  SYMPTOMS TO REPORT IMMEDIATELY:   Following lower endoscopy (colonoscopy or flexible sigmoidoscopy):  Excessive amounts of blood in the stool  Significant tenderness or worsening of abdominal pains  Swelling of the abdomen that is new, acute  Fever of 100F or higher   For urgent or emergent issues, a gastroenterologist can be reached at any hour by calling [phone].   DIET:  We do recommend a small meal at first, but then you may proceed to your regular diet.  Drink plenty of fluids but you should avoid alcoholic beverages for 24 hours.  ACTIVITY:  You should plan to take it easy for the rest of today and you should NOT DRIVE or use heavy machinery until  tomorrow (because of the sedation medicines used during the test).    FOLLOW UP: Our staff will call the number listed on your records the next business day following your procedure to check on you and address any questions or concerns that you may have regarding the information given to you following your procedure. If we do not reach you, we will leave a message.  However, if you are feeling well and you are not experiencing any problems, there is no need to return our call.  We will assume that you have returned to your regular daily activities without incident.  If any biopsies were taken you will be contacted by phone or by letter within the next 1-3 weeks.  Please call us at [phone] if you have not heard about the biopsies in 3 weeks.    SIGNATURES/CONFIDENTIALITY: You and/or your care partner have signed paperwork which will be entered into your electronic medical record.  These signatures attest to the fact that that the information above on your After Visit Summary has been reviewed and is understood.  Full responsibility of the confidentiality of this discharge information lies with you and/or your care-partner.

## 2017-09-21 NOTE — Progress Notes (Signed)
Report to PACU, RN, vss, BBS= Clear.  

## 2017-09-21 NOTE — Telephone Encounter (Signed)
Refill request for Terbinafine. Pt has received therapeutic dosing of 90 terbinafine, if she is continuing to have problem she needs an appt. Return fax denying.

## 2017-09-21 NOTE — Progress Notes (Signed)
Pt took seizure meds this morning-  Last seizure was 06/2017- See TE to Dr Ardis Hughs and Jenny Reichmann about the seizure in August   Pt's states no medical or surgical changes since previsit or office visit.  Attempted IV by J Monday CRNA to right wrist and left AC unsuccessful. Pt upset at this point but stated she was a hard stick at the start  3rd attempt to right hand by E Tasia Liz RN, got great blood return and pt jumped and vein blew. Pt crying at this point stating she is done and wants to leave. Dr Ardis Hughs at the bedside with pt.  Pt offered for [facility] from 3rd floor to attempt and she declined.  Dr Ardis Hughs offered for her to be called by Patty Monday to discuss hospital case , she declined.  I went to the waiting room to discuss with husband Josph Macho.  Husband in admitting spoke with pt. She agreed for 1 more attempt for IV start.  Dr Ardis Hughs aware.  4th IV attempt to left hand successful by Adelene Amas RN- pt tol well.

## 2017-09-21 NOTE — Progress Notes (Signed)
Admitting team attempted IV 3 times (two providers) without success. Molly [name] declined any further IV attempts.  I recommended office RN Rosanne Sack be given an attempt (she is very very good at getting IVs) but patient declined. I offered rescheduling the procedure at the hospital to give IV team a try and she again declined. She just wants to go home.   Her female partner came back to admitting and she agreed for one more IV try.

## 2017-09-24 ENCOUNTER — Telehealth: Payer: Self-pay

## 2017-09-24 NOTE — Telephone Encounter (Signed)
No answer and voicemail has not been set up. Pt request not to leave a message.

## 2017-09-24 NOTE — Telephone Encounter (Signed)
  Follow up Call-  Call back number 09/21/2017  Post procedure Call Back phone  # [phone]  Permission to leave phone message No  Some recent data might be hidden     Patient questions:  Do you have a fever, pain , or abdominal swelling? No. Pain Score  0 *  Have you tolerated food without any problems? Yes.    Have you been able to return to your normal activities? Yes.    Do you have any questions about your discharge instructions: Diet   No. Medications  No. Follow up visit  No.  Do you have questions or concerns about your Care? No.  Actions: * If pain score is 4 or above: No action needed, pain <4.

## 2017-09-28 ENCOUNTER — Other Ambulatory Visit: Payer: Self-pay

## 2017-09-28 NOTE — Telephone Encounter (Signed)
Recieved faxed medication refill request for a 90 day supply of  voltaren 1% gel, no mention in last notes to continue this medication, is it ok to refill for a 90 day supply

## 2017-09-28 NOTE — Telephone Encounter (Signed)
Okay to refill voltaren gel 1 month supply with 2 RF

## 2017-10-01 ENCOUNTER — Telehealth: Payer: Self-pay | Admitting: Gastroenterology

## 2017-10-01 MED ORDER — DICLOFENAC SODIUM 1 % TD GEL: 2.0000 g | Freq: Four times a day (QID) | TRANSDERMAL | 2 refills | Status: DC

## 2017-10-01 NOTE — Telephone Encounter (Signed)
Left message on machine to call back  

## 2017-10-01 NOTE — Telephone Encounter (Signed)
Medication ordered per Dr, Letta Pate orders

## 2017-10-02 ENCOUNTER — Telehealth: Payer: Self-pay | Admitting: Gastroenterology

## 2017-10-02 ENCOUNTER — Ambulatory Visit (INDEPENDENT_AMBULATORY_CARE_PROVIDER_SITE_OTHER): Payer: Medicare Other | Admitting: Podiatry

## 2017-10-02 DIAGNOSIS — S92535D Nondisplaced fracture of distal phalanx of left lesser toe(s), subsequent encounter for fracture with routine healing: Secondary | ICD-10-CM

## 2017-10-02 MED ORDER — HYOSCYAMINE SULFATE SL 0.125 MG SL SUBL: 1.0000 | SUBLINGUAL_TABLET | Freq: Three times a day (TID) | SUBLINGUAL | 5 refills | Status: DC

## 2017-10-02 NOTE — Telephone Encounter (Signed)
The pt will call her insurance and let us know what the alternative is.

## 2017-10-02 NOTE — Telephone Encounter (Signed)
I don't know if this is GI related.  Certainly the lightheadedness and 'buzzing' in ears is probably not GI related.  Please offer levsin 0.125mg SL tabs, take one tab 2-3 times per day as needed for abd pain. Disp 50 with 5 refills. Tell her to continue her current bowel regimen, sounds like it is working pretty well.
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
## 2017-10-02 NOTE — Telephone Encounter (Signed)
Pt states she has left side abd pain under the rib cage radiating done to the lower abd.  Lying flat hurts worse and also tender to touch.  She has BM every 2-3 days, which is normal for her.  No rectal bleeding.  Has been feeling light headed and "buzzing" in her ears.  This all started last week.  Please advise

## 2017-10-02 NOTE — Telephone Encounter (Signed)
The pt has been advised and levsin sent to the pharmacy.  She will also f/u with PCP.

## 2017-10-09 ENCOUNTER — Other Ambulatory Visit: Payer: Self-pay | Admitting: Adult Health

## 2017-10-09 ENCOUNTER — Ambulatory Visit (INDEPENDENT_AMBULATORY_CARE_PROVIDER_SITE_OTHER): Payer: Medicare Other | Admitting: Adult Health

## 2017-10-09 ENCOUNTER — Encounter: Payer: Self-pay | Admitting: Adult Health

## 2017-10-09 VITALS — BP 114/66 | Temp 98.6°F

## 2017-10-09 DIAGNOSIS — Z760 Encounter for issue of repeat prescription: Secondary | ICD-10-CM

## 2017-10-09 DIAGNOSIS — B369 Superficial mycosis, unspecified: Secondary | ICD-10-CM

## 2017-10-09 DIAGNOSIS — S00411A Abrasion of right ear, initial encounter: Secondary | ICD-10-CM | POA: Diagnosis not present

## 2017-10-09 DIAGNOSIS — L0291 Cutaneous abscess, unspecified: Secondary | ICD-10-CM

## 2017-10-09 MED ORDER — CEPHALEXIN 500 MG PO CAPS: 500.0000 mg | ORAL_CAPSULE | Freq: Three times a day (TID) | ORAL | 0 refills | Status: AC

## 2017-10-09 MED ORDER — NYSTATIN 100000 UNIT/GM EX CREA: 1.0000 "application " | TOPICAL_CREAM | Freq: Two times a day (BID) | CUTANEOUS | 0 refills | Status: DC

## 2017-10-09 MED ORDER — CYCLOBENZAPRINE HCL 10 MG PO TABS: 10.0000 mg | ORAL_TABLET | Freq: Every day | ORAL | 4 refills | Status: DC
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
## 2017-10-10 NOTE — Telephone Encounter (Signed)
Ok to refill for one year  

## 2017-10-10 NOTE — Telephone Encounter (Signed)
Sent to the pharmacy as directed. 

## 2017-10-11 ENCOUNTER — Telehealth: Payer: Self-pay | Admitting: *Deleted

## 2017-10-11 MED ORDER — AMMONIUM LACTATE 12 % EX LOTN: 1.0000 "application " | TOPICAL_LOTION | CUTANEOUS | 1 refills | Status: DC | PRN

## 2017-10-11 NOTE — Telephone Encounter (Signed)
Refill request for ammonium lactate 12% lotion. Dr. Milinda Pointer ordered refill +1refill, needs to be seen in 2 months for follow up.

## 2017-10-15 ENCOUNTER — Encounter: Payer: Medicare Other | Admitting: Internal Medicine

## 2017-10-15 ENCOUNTER — Ambulatory Visit: Payer: Self-pay | Admitting: *Deleted

## 2017-10-15 NOTE — Telephone Encounter (Signed)
I saw the doctor last week for itching under my left breast.   He started me on Nystatin cream under my breast and Neosporin cream on my ears.   He thinks it's from me picking at my skin.   Not sure why I got a infection under my breast.    It's different what I have on my ears.   There are bumps on my ears that I pick at.  I found a bump on my scalp that I busted and I had a hard time getting it to stop bleeding.   It is burning now and there are red blisters all under my left breast.   I tried a Tenatin spray but it didn't help.   I tried putting cotton under my breast it didn' it didn't help. I got an appt with Dorothyann Peng at 3:30 10/17/17.     She asked if she should stop using the Nystatin cream since it is burning so bad.  I encouraged her to keep the area under her breast as dry as possible.   Maybe try corn starch.   Also use a cotton cloth of some kind and place under her breast to cushion and keep dry the area under her breast.  She verbalized understanding.

## 2017-10-17 ENCOUNTER — Encounter: Payer: Self-pay | Admitting: Family Medicine

## 2017-10-17 ENCOUNTER — Ambulatory Visit (INDEPENDENT_AMBULATORY_CARE_PROVIDER_SITE_OTHER): Payer: Medicare Other | Admitting: Family Medicine

## 2017-10-17 ENCOUNTER — Ambulatory Visit: Payer: Medicare Other | Admitting: Adult Health

## 2017-10-17 VITALS — BP 120/78 | HR 99 | Temp 98.5°F | Wt 152.4 lb

## 2017-10-17 DIAGNOSIS — B029 Zoster without complications: Secondary | ICD-10-CM | POA: Diagnosis not present

## 2017-10-17 MED ORDER — VALACYCLOVIR HCL 1 G PO TABS: 1000.0000 mg | ORAL_TABLET | Freq: Three times a day (TID) | ORAL | 0 refills | Status: DC

## 2017-10-17 MED ORDER — METHYLPREDNISOLONE 4 MG PO TBPK: ORAL_TABLET | ORAL | 0 refills | Status: DC

## 2017-10-17 NOTE — Progress Notes (Signed)
   Subjective:    Patient ID: Molly [name], female    DOB: March 07, 1976, 41 y.o.   MRN: [id number]  HPI Here to recheck a rash under the left breast that appeared about 4 weeks ago. She was seen here and was given Nystatin cream to apply. At that time the area itched, but since then a burning pain has begun. She also noticed a few blisters yesterday.    Review of Systems  Constitutional: Negative.   Respiratory: Negative.   Cardiovascular: Negative.   Skin: Positive for rash.       Objective:   Physical Exam  Constitutional: She appears well-developed and well-nourished.  Cardiovascular: Normal rate, regular rhythm, normal heart sounds and intact distal pulses.  Pulmonary/Chest: Effort normal and breath sounds normal. No respiratory distress. She has no wheezes. She has no rales.  Skin:  There is an area of erythema and a few vesicles under the left breast          Assessment & Plan:  This is shingles so she will stop the Nystatin cream. Given 10 days of Valtrex and a Medrol dose pack. Recheck prn.  Alysia Penna, MD

## 2017-10-23 ENCOUNTER — Telehealth: Payer: Self-pay | Admitting: Adult Health

## 2017-10-23 NOTE — Telephone Encounter (Signed)
Duplicate

## 2017-10-23 NOTE — Telephone Encounter (Signed)
Copied from [place]. Topic: Quick Communication - See Telephone Encounter >> Oct 23, 2017  4:18 PM Oneta Rack wrote: CRM for notification. See Telephone encounter for:   10/23/17.  Relation to TO:IZTI Call back [phone] Pharmacy: [id number] - [place], [place]. AT [address] [phone] (Phone) [phone] (Fax)    Reason for call:  Patient was prescribed methylPREDNISolone (MEDROL DOSEPAK) 4 MG TBPK tablet , patient states medication is not working, requesting alternate, please advise

## 2017-10-24 NOTE — Telephone Encounter (Signed)
Attempted to contact patient regarding symptoms; left message on voice mail [phone] to contact MD's office

## 2017-10-25 ENCOUNTER — Encounter: Payer: Self-pay | Admitting: Internal Medicine

## 2017-10-25 ENCOUNTER — Ambulatory Visit (INDEPENDENT_AMBULATORY_CARE_PROVIDER_SITE_OTHER): Payer: Medicare Other | Admitting: Internal Medicine

## 2017-10-25 VITALS — BP 120/80 | HR 97 | Ht 69.0 in | Wt 143.0 lb

## 2017-10-25 DIAGNOSIS — K5909 Other constipation: Secondary | ICD-10-CM | POA: Diagnosis not present

## 2017-10-25 DIAGNOSIS — K641 Second degree hemorrhoids: Secondary | ICD-10-CM

## 2017-10-25 MED ORDER — PLECANATIDE 3 MG PO TABS: 3.0000 mg | ORAL_TABLET | Freq: Every day | ORAL | 0 refills | Status: DC

## 2017-10-25 NOTE — Telephone Encounter (Signed)
Pt calling back, has not received a call yet. She was recently in the office and saw Dr. Sarajane Jews, please advise?
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
## 2017-10-25 NOTE — Patient Instructions (Addendum)
  Today we are going to get you samples of Trulance to try one daily.  This is to replace your Linzess.   Message [place] back and give [place] an update on how your doing.    I appreciate the opportunity to care for you. Silvano Rusk, MD, [facility]

## 2017-10-25 NOTE — Telephone Encounter (Signed)
Sent to PCP ?

## 2017-10-26 MED ORDER — GABAPENTIN 100 MG PO CAPS: 100.0000 mg | ORAL_CAPSULE | Freq: Three times a day (TID) | ORAL | 0 refills | Status: DC

## 2017-10-26 MED ORDER — AMITRIPTYLINE HCL 25 MG PO TABS: 25.0000 mg | ORAL_TABLET | Freq: Two times a day (BID) | ORAL | 0 refills | Status: DC

## 2017-10-26 NOTE — Addendum Note (Signed)
Addended by: Myriam Forehand on: 10/26/2017 04:58 PM   Modules accepted: Orders

## 2017-10-26 NOTE — Telephone Encounter (Signed)
Noted. Instead call in Amitriptyline 25 mg bid, #60 with no rf

## 2017-10-26 NOTE — Telephone Encounter (Signed)
Rx sent. Called pt to advise. Pt advised and voiced understanding. However, her insurance does NOT cover gabapentin pt will need another option. Sent to PCP

## 2017-10-26 NOTE — Addendum Note (Signed)
Addended by: Myriam Forehand on: 10/26/2017 11:38 AM   Modules accepted: Orders

## 2017-10-26 NOTE — Telephone Encounter (Signed)
Tell her to finish up the Valtrex but also call in Gabapentin 100 mg to take TID, #30 with no rf

## 2017-10-26 NOTE — Telephone Encounter (Signed)
Pt advised and voiced understanding.   

## 2017-10-31 ENCOUNTER — Other Ambulatory Visit: Payer: Self-pay | Admitting: Adult Health

## 2017-11-01 ENCOUNTER — Telehealth: Payer: Self-pay | Admitting: *Deleted

## 2017-11-01 DIAGNOSIS — F4323 Adjustment disorder with mixed anxiety and depressed mood: Secondary | ICD-10-CM | POA: Diagnosis not present

## 2017-11-01 MED ORDER — MELOXICAM 15 MG PO TABS: 15.0000 mg | ORAL_TABLET | Freq: Every day | ORAL | 0 refills | Status: DC

## 2017-11-01 NOTE — Telephone Encounter (Signed)
Refill request Meloxicam. Dr. Amalia Hailey states refill as ordered 07/02/2017.

## 2017-11-01 NOTE — Telephone Encounter (Signed)
Called to the pharmacy and left on machine. 

## 2017-11-01 NOTE — Telephone Encounter (Signed)
Ok to refill for 30 days  

## 2017-11-02 DIAGNOSIS — Z79899 Other long term (current) drug therapy: Secondary | ICD-10-CM | POA: Diagnosis not present

## 2017-11-02 DIAGNOSIS — F411 Generalized anxiety disorder: Secondary | ICD-10-CM | POA: Diagnosis not present

## 2017-11-02 DIAGNOSIS — F3181 Bipolar II disorder: Secondary | ICD-10-CM | POA: Diagnosis not present

## 2017-11-05 ENCOUNTER — Ambulatory Visit: Payer: Medicare Other | Admitting: Neurology

## 2017-11-20 ENCOUNTER — Ambulatory Visit (INDEPENDENT_AMBULATORY_CARE_PROVIDER_SITE_OTHER): Payer: Medicare Other | Admitting: Neurology

## 2017-11-20 ENCOUNTER — Encounter: Payer: Self-pay | Admitting: Neurology

## 2017-11-20 VITALS — BP 112/78 | HR 90 | Ht 69.0 in | Wt 154.0 lb

## 2017-11-20 DIAGNOSIS — R569 Unspecified convulsions: Secondary | ICD-10-CM

## 2017-11-20 DIAGNOSIS — G43019 Migraine without aura, intractable, without status migrainosus: Secondary | ICD-10-CM

## 2017-11-20 MED ORDER — ZOLMITRIPTAN 5 MG NA SOLN: NASAL | 3 refills | Status: DC

## 2017-11-20 MED ORDER — TOPIRAMATE 100 MG PO TABS: ORAL_TABLET | ORAL | 3 refills | Status: DC
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
## 2017-11-22 ENCOUNTER — Other Ambulatory Visit: Payer: Self-pay | Admitting: Family Medicine

## 2017-11-22 NOTE — Telephone Encounter (Signed)
Last OV 10/23/2017. Medication is NOT on pt's med list. Sent to Dr. Sarajane Jews due to pt's PCP not being in the office.

## 2017-11-23 DIAGNOSIS — F3181 Bipolar II disorder: Secondary | ICD-10-CM | POA: Diagnosis not present

## 2017-11-23 DIAGNOSIS — F411 Generalized anxiety disorder: Secondary | ICD-10-CM | POA: Diagnosis not present

## 2017-11-27 ENCOUNTER — Telehealth: Payer: Self-pay | Admitting: *Deleted

## 2017-11-27 ENCOUNTER — Telehealth: Payer: Self-pay | Admitting: Gastroenterology

## 2017-11-27 MED ORDER — MELOXICAM 15 MG PO TABS: 15.0000 mg | ORAL_TABLET | Freq: Every day | ORAL | 0 refills | Status: DC

## 2017-11-27 NOTE — Telephone Encounter (Signed)
Request for 90 days of meloxicam. Dr. Amalia Hailey states may fill as 90 days.

## 2017-11-28 NOTE — Telephone Encounter (Signed)
Pt states insurance doesn't pay for Miralax anymore and request alternative. Pt states when she came in for procedure Dr Carlean Purl gave her Trulance to try and it helps. Is it okay to fill Trulance or do you want to try something else. Please advise.

## 2017-11-29 ENCOUNTER — Ambulatory Visit (HOSPITAL_BASED_OUTPATIENT_CLINIC_OR_DEPARTMENT_OTHER): Payer: Medicare Other | Admitting: Physical Medicine & Rehabilitation

## 2017-11-29 ENCOUNTER — Ambulatory Visit (INDEPENDENT_AMBULATORY_CARE_PROVIDER_SITE_OTHER): Payer: Medicare Other | Admitting: Podiatry

## 2017-11-29 ENCOUNTER — Encounter: Payer: Medicare Other | Attending: Physical Medicine & Rehabilitation

## 2017-11-29 ENCOUNTER — Encounter: Payer: Self-pay | Admitting: Physical Medicine & Rehabilitation

## 2017-11-29 ENCOUNTER — Encounter: Payer: Self-pay | Admitting: Podiatry

## 2017-11-29 ENCOUNTER — Ambulatory Visit (INDEPENDENT_AMBULATORY_CARE_PROVIDER_SITE_OTHER): Payer: Medicare Other

## 2017-11-29 VITALS — BP 122/80 | HR 92

## 2017-11-29 DIAGNOSIS — I10 Essential (primary) hypertension: Secondary | ICD-10-CM | POA: Diagnosis not present

## 2017-11-29 DIAGNOSIS — S92535D Nondisplaced fracture of distal phalanx of left lesser toe(s), subsequent encounter for fracture with routine healing: Secondary | ICD-10-CM

## 2017-11-29 DIAGNOSIS — K219 Gastro-esophageal reflux disease without esophagitis: Secondary | ICD-10-CM | POA: Insufficient documentation

## 2017-11-29 DIAGNOSIS — M1990 Unspecified osteoarthritis, unspecified site: Secondary | ICD-10-CM | POA: Insufficient documentation

## 2017-11-29 DIAGNOSIS — E7800 Pure hypercholesterolemia, unspecified: Secondary | ICD-10-CM | POA: Insufficient documentation

## 2017-11-29 DIAGNOSIS — M797 Fibromyalgia: Secondary | ICD-10-CM | POA: Insufficient documentation

## 2017-11-29 DIAGNOSIS — Z79899 Other long term (current) drug therapy: Secondary | ICD-10-CM | POA: Insufficient documentation

## 2017-11-29 DIAGNOSIS — G5603 Carpal tunnel syndrome, bilateral upper limbs: Secondary | ICD-10-CM | POA: Diagnosis not present

## 2017-11-29 DIAGNOSIS — Z760 Encounter for issue of repeat prescription: Secondary | ICD-10-CM | POA: Insufficient documentation

## 2017-11-29 DIAGNOSIS — Z818 Family history of other mental and behavioral disorders: Secondary | ICD-10-CM | POA: Insufficient documentation

## 2017-11-29 DIAGNOSIS — G43909 Migraine, unspecified, not intractable, without status migrainosus: Secondary | ICD-10-CM | POA: Diagnosis not present

## 2017-11-29 DIAGNOSIS — Z90710 Acquired absence of both cervix and uterus: Secondary | ICD-10-CM | POA: Insufficient documentation

## 2017-11-29 DIAGNOSIS — G4700 Insomnia, unspecified: Secondary | ICD-10-CM | POA: Insufficient documentation

## 2017-11-29 DIAGNOSIS — Z820 Family history of epilepsy and other diseases of the nervous system: Secondary | ICD-10-CM | POA: Diagnosis not present

## 2017-11-29 DIAGNOSIS — Z836 Family history of other diseases of the respiratory system: Secondary | ICD-10-CM | POA: Insufficient documentation

## 2017-11-29 DIAGNOSIS — Z9889 Other specified postprocedural states: Secondary | ICD-10-CM | POA: Diagnosis not present

## 2017-11-29 MED ORDER — PLECANATIDE 3 MG PO TABS: 3.0000 mg | ORAL_TABLET | Freq: Every day | ORAL | 5 refills | Status: DC

## 2017-11-29 MED ORDER — CYCLOBENZAPRINE HCL 10 MG PO TABS: 10.0000 mg | ORAL_TABLET | Freq: Every day | ORAL | 4 refills | Status: DC

## 2017-11-29 MED ORDER — PREGABALIN 75 MG PO CAPS: 75.0000 mg | ORAL_CAPSULE | Freq: Two times a day (BID) | ORAL | 5 refills | Status: DC

## 2017-11-29 NOTE — Telephone Encounter (Signed)
Rx for Trulance sent in to the pharmacy and appt schedule for April 15 at 11:00.

## 2017-11-29 NOTE — Progress Notes (Signed)
She presents today for follow-up of her fractured fifth digit left foot.  She states that is feeling much better but it still bothers me occasionally particularly in cold weather.  She is concerned about an area on her right ankle that is tender when she first gets out of bed in the mornings she states that this is not consistent and happens once or twice a week if that.  She states that there is nothing that she can correlate to it from the day before or the previous night.  Objective: Vital signs are stable she is alert and oriented x3.  Pulses are palpable.  Neurologic sensorium is intact.  Degenerative flexors are intact.  Muscle strength is normal symmetrical bilateral.  She has tenderness on palpation posterior tibial tendon as it courses beneath the medial malleolus of the right ankle.  This is currently of normal size and does not appear to be inflamed.  There is no erythema cellulitis drainage or odor of the foot.  Left foot does demonstrate mild edema about the fifth digit.  Radiographs do demonstrate a well-healing proximal phalanx.  Assessment: Probable mild tendinitis posterior tibial tendon right.  Well-healing fracture fifth digit left.  Plan: Follow-up with me on an as-needed basis.

## 2017-11-29 NOTE — Patient Instructions (Signed)
Use your stairs for exercise goe up and down several times in a row

## 2017-11-29 NOTE — Telephone Encounter (Signed)
Let her know miralax is OTC.  If she prefers a prescription medicine, then OK to prescribe trulance 3mg  tab, one tab once daily, disp 30 with 5 refills, rov with me in 3-4 months.  Thanks
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
## 2017-11-30 ENCOUNTER — Ambulatory Visit: Payer: Medicare Other | Admitting: Physical Medicine & Rehabilitation

## 2017-11-30 ENCOUNTER — Telehealth: Payer: Self-pay | Admitting: Family Medicine

## 2017-11-30 ENCOUNTER — Telehealth: Payer: Self-pay | Admitting: Gastroenterology

## 2017-11-30 NOTE — Telephone Encounter (Signed)
Patient saw Dr. Sarajane Jews last month, dx w/ shingles. Patient reports rash has not resolved w/ treatment and has actually spread. She is not having any blistering. Scheduled patient for appt on 12/04/17 w/ PCP given unresolved rash despite treatment.

## 2017-11-30 NOTE — Telephone Encounter (Signed)
Copied from [place] [id number]. Topic: General - Other >> Nov 30, 2017 11:10 AM Carolyn Stare wrote:  Pt said she is still having that itching under her left breast and is asking if something can be called in for her   Pharmacy [facility]

## 2017-12-03 ENCOUNTER — Other Ambulatory Visit: Payer: Self-pay | Admitting: Family Medicine

## 2017-12-03 NOTE — Telephone Encounter (Signed)
Called [employer] at [phone] and they are closed for [employer]. Will call them back tomorrow.

## 2017-12-03 NOTE — Telephone Encounter (Signed)
Called  The pat to inform her and to ask of any other medication tried. No answer and no way to leave a voicemail.

## 2017-12-03 NOTE — Telephone Encounter (Signed)
Last OV 10/17/2017. Rx was last refilled 11/22/2017 disp 30 with no refills.

## 2017-12-04 ENCOUNTER — Ambulatory Visit: Payer: Medicaid Other | Admitting: Adult Health

## 2017-12-04 NOTE — Telephone Encounter (Signed)
[facility] doesn't handle the patient's medication and Prior Authorizations. Talked to  [place] at [facility] to initiate a PA, interaction number [id number] and PA# is [id number]. Will here something in 24 hours. Call back or check web site.

## 2017-12-05 ENCOUNTER — Ambulatory Visit: Payer: Medicaid Other | Admitting: Adult Health

## 2017-12-05 ENCOUNTER — Telehealth: Payer: Self-pay | Admitting: Family Medicine

## 2017-12-05 DIAGNOSIS — F411 Generalized anxiety disorder: Secondary | ICD-10-CM | POA: Diagnosis not present

## 2017-12-05 DIAGNOSIS — F3181 Bipolar II disorder: Secondary | ICD-10-CM | POA: Diagnosis not present

## 2017-12-05 NOTE — Telephone Encounter (Signed)
Tried to reach the pt.  Received a message that the customer I am trying to reach is unavailable and then the call was disconnected.  Unable to leave a message.  Pt needs to be notified that she will need to come in for an appointment for a referral by [name] OR she can call her insurance company to see if a referral is required by her insurance plan.  CRM created.

## 2017-12-05 NOTE — Telephone Encounter (Signed)
Copied from [place]. Topic: Referral - Request >> Dec 05, 2017  1:40 PM Molly [name] wrote: Reason for CRM:  pt called in and would like to know if [name] can refer her to [name] for this rash on her breast ?

## 2017-12-05 NOTE — Telephone Encounter (Signed)
Patient scheduled with [facility] for 12/05/2017

## 2017-12-05 NOTE — Telephone Encounter (Signed)
Called [place] [employer] at [phone]  To check on PA of [place].  Talked to [facility] and she stated that medication was denied because the patient is enrolled in [employer] Part D. I talked to the patient and Medicare ([facility]) at [phone] and they stated they don't cover the med and pt is not enrolled in part D. Informed they patient of this and she is going to call Medicare and Medicaid to see what she can do.

## 2017-12-05 NOTE — Telephone Encounter (Signed)
Patient called about the Formulary Acceptance Form. The number is [phone] [place] [employer].  [place] Number PCN number [id number] ID number [id number] Group Number [id number]

## 2017-12-07 ENCOUNTER — Other Ambulatory Visit: Payer: Self-pay | Admitting: Adult Health

## 2017-12-07 ENCOUNTER — Ambulatory Visit: Payer: Medicaid Other | Admitting: Adult Health

## 2017-12-07 DIAGNOSIS — Z0289 Encounter for other administrative examinations: Secondary | ICD-10-CM

## 2017-12-07 DIAGNOSIS — R21 Rash and other nonspecific skin eruption: Secondary | ICD-10-CM

## 2017-12-14 ENCOUNTER — Telehealth: Payer: Self-pay | Admitting: *Deleted

## 2017-12-14 NOTE — Telephone Encounter (Signed)
Pt states she has been talking to Dr. Milinda Pointer about her ankle pain and he said she could pick up compression anklettes. I told pt we would be here until 4:00pm today to pick up.

## 2017-12-17 ENCOUNTER — Telehealth: Payer: Self-pay | Admitting: Adult Health

## 2017-12-17 ENCOUNTER — Ambulatory Visit: Payer: Self-pay

## 2017-12-17 NOTE — Telephone Encounter (Signed)
Copied from [place]. Topic: Inquiry >> Dec 17, 2017 12:48 PM Celestial Barnfield, Claiborne Bill[place], [place] wrote: Reason for CRM: Patient calling because she would like to know if [facility] could call her in something different than Zyrtes. Stated that it isn't helping her. Still having a runny nose and sore throat. Patient would like the new medication to be sent to the [facility] [place]. [place] [phone]

## 2017-12-17 NOTE — Telephone Encounter (Signed)
Patient calling because she would like to know if Nafziger could call her in something different than Zyrtes. Stated that it isn't helping her. Still having a runny nose and sore throat. Patient called to triage her for her symptoms of sore throat. Patient stated "my throat has been hurting for about a week, but it got worse about 3 days ago when I ate shrimp. I took benadryl 3 different times and it helped some, but my throat is still scratchy and sore. The soreness is off and on, but still there. I also have a runny nose." I asked what does her tonsils look like, she looked and said "no pus, red and a little swollen." I asked has she had a fever, she said "I didn't pay attention, but I haven't had cold chills." According to protocol, see PCP within 3 days, appointment made for 12/18/17, care advice given, verbalized understanding.  Reason for Disposition . [1] Sore throat is the only symptom AND [2] present > 48 hours  Answer Assessment - Initial Assessment Questions 1. ONSET: "When did the throat start hurting?" (Hours or days ago)      Last week, got worse when ate shrimp, took benadryl which helped 2. SEVERITY: "How bad is the sore throat?" (Scale 1-10; mild, moderate or severe)   - MILD (1-3):  doesn't interfere with eating or normal activities   - MODERATE (4-7): interferes with eating some solids and normal activities   - SEVERE (8-10):  excruciating pain, interferes with most normal activities   - SEVERE DYSPHAGIA: can't swallow liquids, drooling     Moderate 3. STREP EXPOSURE: "Has there been any exposure to strep within the past week?" If so, ask: "What type of contact occurred?"      Not that know of 4.  VIRAL SYMPTOMS: "Are there any symptoms of a cold, such as a runny nose, cough, hoarse voice or red eyes?"      Runny nose, red eyes, cough 5. FEVER: "Do you have a fever?" If so, ask: "What is your temperature, how was it measured, and when did it start?"     I didn't pay any  attention 6. PUS ON THE TONSILS: "Is there pus on the tonsils in the back of your throat?"    No, just red and a little swollen 7. OTHER SYMPTOMS: "Do you have any other symptoms?" (e.g., difficulty breathing, headache, rash)     Headache last night and this morning. 8. PREGNANCY: "Is there any chance you are pregnant?" "When was your last menstrual period?"     No  Protocols used: SORE THROAT-A-AH

## 2017-12-18 ENCOUNTER — Ambulatory Visit: Payer: Medicaid Other | Admitting: Adult Health

## 2017-12-18 DIAGNOSIS — L304 Erythema intertrigo: Secondary | ICD-10-CM | POA: Diagnosis not present

## 2017-12-18 DIAGNOSIS — S86012A Strain of left Achilles tendon, initial encounter: Secondary | ICD-10-CM | POA: Diagnosis not present

## 2017-12-18 DIAGNOSIS — J029 Acute pharyngitis, unspecified: Secondary | ICD-10-CM | POA: Diagnosis not present

## 2017-12-19 ENCOUNTER — Other Ambulatory Visit: Payer: Self-pay | Admitting: Family Medicine

## 2017-12-20 ENCOUNTER — Ambulatory Visit: Payer: Medicare Other | Admitting: Podiatry

## 2017-12-20 NOTE — Telephone Encounter (Signed)
Sent to the pharmacy by e-scribe. 

## 2018-01-07 DIAGNOSIS — F411 Generalized anxiety disorder: Secondary | ICD-10-CM | POA: Diagnosis not present

## 2018-01-07 DIAGNOSIS — F3181 Bipolar II disorder: Secondary | ICD-10-CM | POA: Diagnosis not present

## 2018-01-10 ENCOUNTER — Other Ambulatory Visit: Payer: Self-pay

## 2018-01-10 MED ORDER — DICLOFENAC SODIUM 1 % TD GEL: 2.0000 g | Freq: Four times a day (QID) | TRANSDERMAL | 1 refills | Status: DC

## 2018-01-11 ENCOUNTER — Telehealth: Payer: Self-pay | Admitting: *Deleted

## 2018-01-11 ENCOUNTER — Other Ambulatory Visit: Payer: Self-pay | Admitting: Adult Health

## 2018-01-11 DIAGNOSIS — G4700 Insomnia, unspecified: Secondary | ICD-10-CM

## 2018-01-11 NOTE — Telephone Encounter (Deleted)
Copied from [place] [id number]. Topic: Inquiry >> Jan 11, 2018  3:23 PM Vernona Rieger wrote: Reason for CRM:   Patient said she received a "no show $50" bill in the mail for an appt on 2/5. She said that she was late for the appt & told she could not be seen, patient is upset bc she was told by her dr not to drive and she had to depend on a ride. Please call patient [phone]

## 2018-01-11 NOTE — Telephone Encounter (Signed)
Copied from [place] [id number]. Topic: Inquiry >> Jan 11, 2018  3:23 PM Vernona Rieger wrote: Reason for CRM:   Patient said she received a "no show $50" bill in the mail for an appt on 2/5. She said that she was late for the appt & told she could not be seen, patient is upset bc she was told by her dr not to drive and she had to depend on a ride. Please call patient [phone]

## 2018-01-14 NOTE — Telephone Encounter (Signed)
Sent e-mail to "charge corrections" to have charge dropped. I called patient to let her know.

## 2018-01-15 DIAGNOSIS — F3181 Bipolar II disorder: Secondary | ICD-10-CM | POA: Diagnosis not present

## 2018-01-15 DIAGNOSIS — F411 Generalized anxiety disorder: Secondary | ICD-10-CM | POA: Diagnosis not present

## 2018-01-15 NOTE — Telephone Encounter (Signed)
Last OV: 10/17/17 w/ Dr. Sarajane Jews, 10/09/17 w/ PCP Last filled: 11/01/2017, #30, 0 RF Sig: TAKE 1 TABLET BY MOUTH EVERY DAY AT BEDTIME AS NEEDED FOR SLEEP UDS: Not on file

## 2018-01-20 ENCOUNTER — Other Ambulatory Visit: Payer: Self-pay | Admitting: Adult Health

## 2018-01-22 NOTE — Telephone Encounter (Signed)
Sent to the pharmacy by e-scribe. 

## 2018-01-24 DIAGNOSIS — F411 Generalized anxiety disorder: Secondary | ICD-10-CM | POA: Diagnosis not present

## 2018-01-24 DIAGNOSIS — F3181 Bipolar II disorder: Secondary | ICD-10-CM | POA: Diagnosis not present

## 2018-02-13 ENCOUNTER — Ambulatory Visit: Payer: Medicare Other

## 2018-02-13 ENCOUNTER — Encounter: Payer: Self-pay | Admitting: Podiatry

## 2018-02-13 ENCOUNTER — Ambulatory Visit (INDEPENDENT_AMBULATORY_CARE_PROVIDER_SITE_OTHER): Payer: Medicare Other | Admitting: Podiatry

## 2018-02-13 DIAGNOSIS — M2042 Other hammer toe(s) (acquired), left foot: Secondary | ICD-10-CM | POA: Diagnosis not present

## 2018-02-13 DIAGNOSIS — R52 Pain, unspecified: Secondary | ICD-10-CM

## 2018-02-13 DIAGNOSIS — M2041 Other hammer toe(s) (acquired), right foot: Secondary | ICD-10-CM

## 2018-02-13 DIAGNOSIS — F3181 Bipolar II disorder: Secondary | ICD-10-CM | POA: Diagnosis not present

## 2018-02-13 DIAGNOSIS — J029 Acute pharyngitis, unspecified: Secondary | ICD-10-CM | POA: Diagnosis not present

## 2018-02-13 DIAGNOSIS — F411 Generalized anxiety disorder: Secondary | ICD-10-CM | POA: Diagnosis not present

## 2018-02-14 ENCOUNTER — Other Ambulatory Visit: Payer: Self-pay | Admitting: Adult Health

## 2018-02-14 DIAGNOSIS — G4700 Insomnia, unspecified: Secondary | ICD-10-CM

## 2018-02-15 ENCOUNTER — Encounter: Payer: Self-pay | Admitting: Adult Health

## 2018-02-15 ENCOUNTER — Ambulatory Visit (INDEPENDENT_AMBULATORY_CARE_PROVIDER_SITE_OTHER)
Admission: RE | Admit: 2018-02-15 | Discharge: 2018-02-15 | Disposition: A | Discharge status: Home / Self Care | Payer: Medicare Other | Source: Ambulatory Visit | Attending: Adult Health | Admitting: Adult Health

## 2018-02-15 ENCOUNTER — Ambulatory Visit: Payer: Medicaid Other | Admitting: Adult Health

## 2018-02-15 ENCOUNTER — Ambulatory Visit: Payer: Self-pay | Admitting: *Deleted

## 2018-02-15 ENCOUNTER — Telehealth: Payer: Self-pay | Admitting: Adult Health

## 2018-02-15 ENCOUNTER — Ambulatory Visit (INDEPENDENT_AMBULATORY_CARE_PROVIDER_SITE_OTHER): Payer: Medicare Other | Admitting: Adult Health

## 2018-02-15 VITALS — BP 122/80 | HR 96 | Temp 98.3°F | Wt 150.0 lb

## 2018-02-15 DIAGNOSIS — J069 Acute upper respiratory infection, unspecified: Secondary | ICD-10-CM

## 2018-02-15 DIAGNOSIS — R079 Chest pain, unspecified: Secondary | ICD-10-CM | POA: Diagnosis not present

## 2018-02-15 DIAGNOSIS — R05 Cough: Secondary | ICD-10-CM | POA: Diagnosis not present
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
## 2018-02-15 NOTE — Telephone Encounter (Signed)
Copied from [place] [id number] [id number]. Topic: Quick Communication - See Telephone Encounter >> Feb 15, 2018  2:01 PM Hewitt Shorts wrote: CRM for notification. See Telephone encounter for: 02/15/18. Pt was seen by nafzigar today and a fast med on Wednesday and they called her and said the culture has bacteria and that they called in an antibiotic does she need to get this medication or wait for todays results   Best number [phone]
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
## 2018-02-15 NOTE — Telephone Encounter (Signed)
Pt called with complaints of sore throat that started 2 days ago; here temp was 103 last night, and she is having body aches and vomiting when she coughs hard; here nasal secretions are brown with a bloody tint; she is coughing up greenish phlegm; she was seen at [facility] a few days ago for sore throat and received medication to gargle with; she states that she was told she was negative for strep;  nurse triage initiated and recommendations made per protocol to include seeing a physician within 24 hours; pt offered and accepted appointment with Dorothyann Peng, [facility], today at 1030; pt verbalizes understanding; will route to office for notification of this upcoming appointment.   Reason for Disposition . SEVERE coughing spells (e.g., whooping sound after coughing, vomiting after coughing)  Answer Assessment - Initial Assessment Questions 1. ONSET: "When did the cough begin?"      02/13/18 2. SEVERITY: "How bad is the cough today?"      Severe; unable to slep 3. RESPIRATORY DISTRESS: "Describe your breathing."      Takes shallow breaths because it hurts to breath  4. FEVER: "Do you have a fever?" If so, ask: "What is your temperature, how was it measured, and when did it start?"     Temp 103 last night 5. SPUTUM: "Describe the color of your sputum" (clear, white, yellow, green)     greenish 6. HEMOPTYSIS: "Are you coughing up any blood?" If so ask: "How much?" (flecks, streaks, tablespoons, etc.)     "tastes like it in her throat" 7. CARDIAC HISTORY: "Do you have any history of heart disease?" (e.g., heart attack, congestive heart failure)      no 8. LUNG HISTORY: "Do you have any history of lung disease?"  (e.g., pulmonary embolus, asthma, emphysema)     no 9. PE RISK FACTORS: "Do you have a history of blood clots?" (or: recent major surgery, recent prolonged travel, bedridden )     no 10. OTHER SYMPTOMS: "Do you have any other symptoms?" (e.g., runny nose, wheezing, chest pain)  Body aches, runny nose, headaches 11. PREGNANCY: "Is there any chance you are pregnant?" "When was your last menstrual period?"       No hysterectomy 12. TRAVEL: "Have you traveled out of the country in the last month?" (e.g., travel history, exposures)       no  Protocols used: [place]

## 2018-02-15 NOTE — Telephone Encounter (Signed)
Spoke to the pt.  She could not tell me what bacteria she had but notified me that amoxicillin was sent to the pharmacy and she had picked it up.  Instructed her to start antibiotic as it was confirmed she has some type of illness.  Instructed her to call back if not better after completing the antibiotic.  Also informed her that the x-ray result was not back yet.

## 2018-02-16 ENCOUNTER — Encounter (HOSPITAL_COMMUNITY): Payer: Self-pay | Admitting: Emergency Medicine

## 2018-02-16 ENCOUNTER — Other Ambulatory Visit: Payer: Self-pay

## 2018-02-16 ENCOUNTER — Emergency Department (HOSPITAL_COMMUNITY)
Admission: EM | Admit: 2018-02-16 | Discharge: 2018-02-16 | Disposition: A | Discharge status: Home / Self Care | Payer: Medicare Other | Attending: Emergency Medicine | Admitting: Emergency Medicine

## 2018-02-16 ENCOUNTER — Emergency Department (HOSPITAL_COMMUNITY): Payer: Medicare Other

## 2018-02-16 DIAGNOSIS — I10 Essential (primary) hypertension: Secondary | ICD-10-CM | POA: Insufficient documentation

## 2018-02-16 DIAGNOSIS — J189 Pneumonia, unspecified organism: Secondary | ICD-10-CM | POA: Insufficient documentation

## 2018-02-16 DIAGNOSIS — R0602 Shortness of breath: Secondary | ICD-10-CM | POA: Diagnosis not present

## 2018-02-16 DIAGNOSIS — Z79899 Other long term (current) drug therapy: Secondary | ICD-10-CM | POA: Diagnosis not present

## 2018-02-16 DIAGNOSIS — R51 Headache: Secondary | ICD-10-CM | POA: Diagnosis not present

## 2018-02-16 DIAGNOSIS — J029 Acute pharyngitis, unspecified: Secondary | ICD-10-CM | POA: Diagnosis not present

## 2018-02-16 DIAGNOSIS — R079 Chest pain, unspecified: Secondary | ICD-10-CM | POA: Diagnosis not present

## 2018-02-16 LAB — BASIC METABOLIC PANEL
Anion gap: 10 (ref 5–15)
CALCIUM: 8.7 mg/dL — AB (ref 8.9–10.3)
CO2: 23 mmol/L (ref 22–32)
CREATININE: 0.95 mg/dL (ref 0.44–1.00)
Chloride: 107 mmol/L (ref 101–111)
GFR calc Af Amer: >60 mL/min (ref >60)
GLUCOSE: 90 mg/dL (ref 65–99)
Potassium: 3.3 mmol/L — ABNORMAL LOW (ref 3.5–5.1)
SODIUM: 140 mmol/L (ref 135–145)

## 2018-02-16 LAB — CBC
HCT: 32.8 % — ABNORMAL LOW (ref 36.0–46.0)
Hemoglobin: 10.5 g/dL — ABNORMAL LOW (ref 12.0–15.0)
MCH: 29.8 pg (ref 26.0–34.0)
MCHC: 32 g/dL (ref 30.0–36.0)
MCV: 93.2 fL (ref 78.0–100.0)
PLATELETS: 159 10*3/uL (ref 150–400)
RBC: 3.52 MIL/uL — ABNORMAL LOW (ref 3.87–5.11)
RDW: 12.3 % (ref 11.5–15.5)
WBC: 6.1 10*3/uL (ref 4.0–10.5)

## 2018-02-16 LAB — I-STAT BETA HCG BLOOD, ED (MC, WL, AP ONLY)

## 2018-02-16 LAB — I-STAT TROPONIN, ED: Troponin i, poc: 0 ng/mL (ref 0.00–0.08)

## 2018-02-16 MED ORDER — BENZONATATE 100 MG PO CAPS
200.0000 mg | ORAL_CAPSULE | Freq: Once | ORAL | Status: AC
  Administered 2018-02-16: 200 mg via ORAL
  Filled 2018-02-16: qty 2

## 2018-02-16 MED ORDER — MORPHINE SULFATE (PF) 4 MG/ML IV SOLN
4.0000 mg | Freq: Once | INTRAVENOUS | Status: AC
Start: 2018-02-16 — End: 2018-02-16
  Administered 2018-02-16: 4 mg via INTRAVENOUS
  Filled 2018-02-16: qty 1

## 2018-02-16 MED ORDER — HYDROCODONE-HOMATROPINE 5-1.5 MG/5ML PO SYRP: 5.0000 mL | ORAL_SOLUTION | Freq: Four times a day (QID) | ORAL | 0 refills | Status: DC | PRN

## 2018-02-16 MED ORDER — SODIUM CHLORIDE 0.9 % IV SOLN
500.0000 mg | Freq: Once | INTRAVENOUS | Status: AC
  Administered 2018-02-16: 500 mg via INTRAVENOUS
  Filled 2018-02-16: qty 500

## 2018-02-16 MED ORDER — ONDANSETRON HCL 4 MG/2ML IJ SOLN
4.0000 mg | Freq: Once | INTRAMUSCULAR | Status: AC
  Administered 2018-02-16: 4 mg via INTRAVENOUS
  Filled 2018-02-16: qty 2

## 2018-02-16 MED ORDER — SODIUM CHLORIDE 0.9 % IV SOLN
1.0000 g | Freq: Once | INTRAVENOUS | Status: AC
  Administered 2018-02-16: 1 g via INTRAVENOUS
  Filled 2018-02-16: qty 10

## 2018-02-16 MED ORDER — AZITHROMYCIN 250 MG PO TABS: 250.0000 mg | ORAL_TABLET | Freq: Every day | ORAL | 0 refills | Status: DC

## 2018-02-16 MED ORDER — ONDANSETRON 4 MG PO TBDP
4.0000 mg | ORAL_TABLET | Freq: Once | ORAL | Status: AC
  Administered 2018-02-16: 4 mg via ORAL
  Filled 2018-02-16: qty 1

## 2018-02-16 MED ORDER — SODIUM CHLORIDE 0.9 % IV BOLUS
1000.0000 mL | Freq: Once | INTRAVENOUS | Status: AC
  Administered 2018-02-16: 1000 mL via INTRAVENOUS

## 2018-02-16 MED ORDER — POTASSIUM CHLORIDE CRYS ER 20 MEQ PO TBCR
20.0000 meq | EXTENDED_RELEASE_TABLET | Freq: Once | ORAL | Status: AC
  Administered 2018-02-16: 20 meq via ORAL
  Filled 2018-02-16: qty 1
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
## 2018-02-16 NOTE — ED Notes (Signed)
Iv removed

## 2018-02-16 NOTE — ED Triage Notes (Signed)
Reports being seen earlier in the week with cold symptoms.  Was swabbed for flu and strep.  Told positive strep B.  Given amoxicillin first.  Changed to cefdinir [facility] at [facility].  Reports n/v/d and aching all over.  Endorses central chest pressure as well as body aches.

## 2018-02-16 NOTE — Discharge Instructions (Addendum)
Your evaluated in the emergency department for an upper respiratory illness.  Your chest x-ray you have pneumonia.  You were given some IV fluids and IV antibiotics.  We are sending you home with a second antibiotic to take.  Please continue both of them.  We are also prescribing a cough medication that may be sedating.  Please try to stay well-hydrated and take Tylenol and ibuprofen for pain as needed.  You should follow-up with your doctor early this week and return to the hospital if any worsening symptoms.

## 2018-02-19 ENCOUNTER — Ambulatory Visit: Payer: Self-pay | Admitting: *Deleted

## 2018-02-19 ENCOUNTER — Ambulatory Visit (INDEPENDENT_AMBULATORY_CARE_PROVIDER_SITE_OTHER): Payer: Medicare Other | Admitting: Adult Health

## 2018-02-19 ENCOUNTER — Encounter: Payer: Self-pay | Admitting: Adult Health

## 2018-02-19 VITALS — BP 122/80 | HR 77 | Temp 99.7°F

## 2018-02-19 DIAGNOSIS — R059 Cough, unspecified: Secondary | ICD-10-CM

## 2018-02-19 DIAGNOSIS — J181 Lobar pneumonia, unspecified organism: Secondary | ICD-10-CM | POA: Diagnosis not present

## 2018-02-19 DIAGNOSIS — R05 Cough: Secondary | ICD-10-CM

## 2018-02-19 DIAGNOSIS — J189 Pneumonia, unspecified organism: Secondary | ICD-10-CM

## 2018-02-19 DIAGNOSIS — R112 Nausea with vomiting, unspecified: Secondary | ICD-10-CM

## 2018-02-19 MED ORDER — ONDANSETRON HCL 8 MG PO TABS: 8.0000 mg | ORAL_TABLET | Freq: Three times a day (TID) | ORAL | 1 refills | Status: DC | PRN

## 2018-02-19 MED ORDER — ONDANSETRON HCL 4 MG/2ML IJ SOLN
4.0000 mg | Freq: Once | INTRAMUSCULAR | Status: AC
  Administered 2018-02-19: 4 mg via INTRAMUSCULAR

## 2018-02-19 MED ORDER — BENZONATATE 200 MG PO CAPS: 200.0000 mg | ORAL_CAPSULE | Freq: Two times a day (BID) | ORAL | 1 refills | Status: DC | PRN

## 2018-02-19 NOTE — Telephone Encounter (Signed)
Pt called due to pneumonia and she is having pain in her chest, back, and sides due to cough; she is nausea; she was seen in the hospital on 02/16/18 but refused admission; she is requesting medications for cough and nausea; spoke with Sunday Spillers and suggestion was made for pt to be seen in office today by [name]; the pt is offered and accepted at 1500 appopintment today with Dorothyann Peng, [facility]; she verbalizes understanding; will route to office for notification of this upcoming appointment   Reason for Disposition . Patient sounds very sick or weak to the triager  Answer Assessment - Initial Assessment Questions 1. SYMPTOMS: "What symptoms are you most concerned about?" "Is it better, the same, or worse compared to when you saw the doctor?"     Cough, nausea, headache 2. BREATHING DIFFICULTY: "Are you having any difficulty breathing?" If so, ask "How bad is it?"  (e.g., none, mild, moderate, severe)   - MILD: No SOB at rest, mild SOB with walking, speaks normally in sentences, can lay down, no retractions, pulse < 100.    - MODERATE: SOB at rest, SOB with minimal exertion and prefers to sit, cannot lie down flat, speaks in phrases, mild retractions, audible wheezing, pulse 100-120.    - SEVERE: Very SOB at rest, speaks in single words, struggling to breathe, sitting hunched forward, retractions, pulse > 120      moderate 3. FEVER: "Do you have a fever?" If so, ask: "What is your temperature, how was it measured, and when did it start?"     no 4. SPUTUM: "Describe the color of your sputum" (clear, white, yellow, green, blood-tinged)     brown 5. DIAGNOSIS CONFIRMATION: "When was the pneumonia diagnosed?" "By whom?"     Diagnosed in ED 02/16/18 6. ANTIBIOTIC: "Are you taking an antibiotic?"  If so, "Which one?" "When was it started?"     Amoxicillan; antibiotics also given in ED  7. OTHER TREATMENT: "Are you receiving any other treatment for the pneumonia?" (e.g., albuterol nebulizer, oxygen) If  so, ask, "How often?" and "Do they help?"     no 8. HOSPITAL ADMISSION: "Were you hospitalized for this pneumonia?" If so, ask "When were you discharged home from the hospital?"     Refused admission  Protocols used: PNEUMONIA ON ANTIBIOTIC FOLLOW-UP CALL-A-AH
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
## 2018-02-19 NOTE — Patient Instructions (Signed)
I am sorry you are still feeling bad.   I have sent in a prescription for Zofran 8 mg tablets and for Tessalon [place] to help with your symptoms   You can use Pepto Bismol to help help coat your symptoms   Finish your antibiotics   Follow up as needed

## 2018-02-20 ENCOUNTER — Telehealth: Payer: Self-pay | Admitting: *Deleted

## 2018-02-20 ENCOUNTER — Telehealth: Payer: Self-pay | Admitting: Adult Health

## 2018-02-20 MED ORDER — ALBUTEROL SULFATE 108 (90 BASE) MCG/ACT IN AEPB: 2.0000 | INHALATION_SPRAY | Freq: Four times a day (QID) | RESPIRATORY_TRACT | 0 refills | Status: DC | PRN

## 2018-02-20 MED ORDER — FLUCONAZOLE 150 MG PO TABS: 150.0000 mg | ORAL_TABLET | Freq: Once | ORAL | 0 refills | Status: AC

## 2018-02-20 MED ORDER — MELOXICAM 15 MG PO TABS: 15.0000 mg | ORAL_TABLET | Freq: Every day | ORAL | 2 refills | Status: DC

## 2018-02-20 NOTE — Telephone Encounter (Signed)
Refill request for meloxicam. Dr. Amalia Hailey states refill +2, will need an appt prior to future refills.

## 2018-02-20 NOTE — Telephone Encounter (Signed)
Per [name], no other antibiotic needed at this time.  Advised that she pick up the [employer] and ProAir.  Pt also asked if Diflucan can be sent in due the antibiotic received at the hospital through IV and oral medication given by [facility]  Authorized and sent by e-scribe.  No further action required.

## 2018-02-20 NOTE — Telephone Encounter (Signed)
Copied from [place] [phone]. Topic: Quick Communication - See Telephone Encounter >> Feb 20, 2018  9:47 AM Arletha Grippe wrote: CRM for notification. See Telephone encounter for: 02/20/18. Pt was told that there would be abx sent in, and the pharm did not have any. [facility] [address]  Cb is [phone]

## 2018-02-25 ENCOUNTER — Ambulatory Visit: Payer: Medicaid Other | Admitting: Gastroenterology

## 2018-02-26 ENCOUNTER — Telehealth: Payer: Self-pay | Admitting: Family Medicine

## 2018-02-26 ENCOUNTER — Other Ambulatory Visit: Payer: Self-pay | Admitting: Adult Health

## 2018-02-26 ENCOUNTER — Other Ambulatory Visit: Payer: Self-pay | Admitting: Family Medicine

## 2018-02-26 ENCOUNTER — Ambulatory Visit (INDEPENDENT_AMBULATORY_CARE_PROVIDER_SITE_OTHER)
Admission: RE | Admit: 2018-02-26 | Discharge: 2018-02-26 | Disposition: A | Discharge status: Home / Self Care | Payer: Medicare Other | Source: Ambulatory Visit | Attending: Adult Health | Admitting: Adult Health

## 2018-02-26 ENCOUNTER — Ambulatory Visit: Payer: Self-pay | Admitting: *Deleted

## 2018-02-26 DIAGNOSIS — J189 Pneumonia, unspecified organism: Secondary | ICD-10-CM | POA: Diagnosis not present

## 2018-02-26 DIAGNOSIS — Z8701 Personal history of pneumonia (recurrent): Secondary | ICD-10-CM | POA: Diagnosis not present

## 2018-02-26 MED ORDER — ACETAMINOPHEN-CODEINE #2 300-15 MG PO TABS: 1.0000 | ORAL_TABLET | Freq: Four times a day (QID) | ORAL | 0 refills | Status: AC | PRN

## 2018-02-26 MED ORDER — LEVOFLOXACIN 500 MG PO TABS: 500.0000 mg | ORAL_TABLET | Freq: Every day | ORAL | 0 refills | Status: DC

## 2018-02-26 NOTE — Telephone Encounter (Signed)
Sent to the pharmacy by e-scribe. 

## 2018-02-26 NOTE — Telephone Encounter (Signed)
Probably not contagious, she has been on multiple antibiotics

## 2018-02-26 NOTE — Telephone Encounter (Signed)
Pt called with c/o pain in her chest when she  breathes or cough. She has a productive cough and was dx with pneumonia last week. She is taking cough meds but feel like they are not working now. No fever or earache. Advised to go to emergency department to be assessed.  Pt wants to wait before she goes. Advised that if she starts feeling worst to go straight to the ER, she voiced understanding.  Home care advice given to her with verbal understanding.  Reason for Disposition . Chest pain  (Exception: MILD central chest pain, present only when coughing)  Answer Assessment - Initial Assessment Questions 1. LOCATION: "Where does it hurt?"       On the right side underneath breast 2. RADIATION: "Does the pain go anywhere else?" (e.g., into neck, jaw, arms, back)     no 3. ONSET: "When did the chest pain begin?" (Minutes, hours or days)      3 days 4. PATTERN "Does the pain come and go, or has it been constant since it started?"  "Does it get worse with exertion?"      Constant. Just breathing makes it worst 5. DURATION: "How long does it last" (e.g., seconds, minutes, hours)     Never goes away 6. SEVERITY: "How bad is the pain?"  (e.g., Scale 1-10; mild, moderate, or severe)    - MILD (1-3): doesn't interfere with normal activities     - MODERATE (4-7): interferes with normal activities or awakens from sleep    - SEVERE (8-10): excruciating pain, unable to do any normal activities       Pain # 10 7. CARDIAC RISK FACTORS: "Do you have any history of heart problems or risk factors for heart disease?" (e.g., prior heart attack, angina; high blood pressure, diabetes, being overweight, high cholesterol, smoking, or strong family history of heart disease)     High cholesterol, dad had heart disease 8. PULMONARY RISK FACTORS: "Do you have any history of lung disease?"  (e.g., blood clots in lung, asthma, emphysema, birth control pills)     Asthma as a child and hx of birth control pills, hx of blood  clots 9. CAUSE: "What do you think is causing the chest pain?"     Upper respiratory symptoms 10. OTHER SYMPTOMS: "Do you have any other symptoms?" (e.g., dizziness, nausea, vomiting, sweating, fever, difficulty breathing, cough)       Hurts to take a deep breath, headaches, sweats 11. PREGNANCY: "Is there any chance you are pregnant?" "When was your last menstrual period?"       No  Had hysterectomy  In 2003  Answer Assessment - Initial Assessment Questions 1. ONSET: "When did the cough begin?"      Last week 2. SEVERITY: "How bad is the cough today?"      bad 3. RESPIRATORY DISTRESS: "Describe your breathing."      Just hurts to take a deep breath 4. FEVER: "Do you have a fever?" If so, ask: "What is your temperature, how was it measured, and when did it start?"     no 5. SPUTUM: "Describe the color of your sputum" (clear, white, yellow, green)     white 6. HEMOPTYSIS: "Are you coughing up any blood?" If so ask: "How much?" (flecks, streaks, tablespoons, etc.)     No blood 7. CARDIAC HISTORY: "Do you have any history of heart disease?" (e.g., heart attack, congestive heart failure)      no 8. LUNG HISTORY: "Do you  have any history of lung disease?"  (e.g., pulmonary embolus, asthma, emphysema)     no 9. PE RISK FACTORS: "Do you have a history of blood clots?" (or: recent major surgery, recent prolonged travel, bedridden )     Yes, one time in her arm 10. OTHER SYMPTOMS: "Do you have any other symptoms?" (e.g., runny nose, wheezing, chest pain)        A little wheezing, runny nose and chest pain when she coughs or breathes 11. PREGNANCY: "Is there any chance you are pregnant?" "When was your last menstrual period?"       No, had hysterectomy 12. TRAVEL: "Have you traveled out of the country in the last month?" (e.g., travel history, exposures)       no  Protocols used: COUGH - ACUTE PRODUCTIVE-A-AH, CHEST PAIN-A-AH

## 2018-02-26 NOTE — Telephone Encounter (Signed)
Ok to refill for 30 days  

## 2018-02-26 NOTE — Telephone Encounter (Signed)
Please find out if she is feeling better. If she is still ill then I have her get a chest x ray ( order entered).   For the cough, we can try either prednisone, or some Tylenol with codeine

## 2018-02-26 NOTE — Telephone Encounter (Signed)
Spoke to the pt.  She states she still does not feel well.  Agreed to go to [facility] for x-ray.  Declined prednisone.  Makes her gain weight.  Please send in Tylenol with codeine.

## 2018-02-26 NOTE — Telephone Encounter (Signed)
I spoke with pt, she asked that [place] review this note, would like further advice. She does not want to make a trip to ER, she was just here in office about 1 week ago, says the pain is more rib/side pain than chest pain. She has finished up all antibiotics, still taking both capsule & syrup for coughing, almost out, taking Ibuprofen also. Please advise?

## 2018-02-26 NOTE — Telephone Encounter (Signed)
Pt notified. No further action required.

## 2018-02-26 NOTE — Telephone Encounter (Signed)
Copied from [place] [id number]. Topic: General - Other >> Feb 26, 2018  4:13 PM Carolyn Stare wrote:  Pt said she received a call today saying she still had pneumnoia and is asking if she is contagious, would like a call back

## 2018-02-26 NOTE — Telephone Encounter (Signed)
Monitor for possible ER arrival

## 2018-03-01 ENCOUNTER — Telehealth: Payer: Self-pay | Admitting: Adult Health

## 2018-03-01 NOTE — Telephone Encounter (Signed)
Copied from [place] [phone]. Topic: Quick Communication - Rx Refill/Question >> Mar 01, 2018 12:45 PM Margot Ables wrote: Medication: Proair Respiclick is not covered by insurance. Requesting to change pt to VEntolin inhaler Preferred Pharmacy (with phone number or street name): [id number] [place], [place]. AT [address] [phone] (Phone) [phone] (Fax)

## 2018-03-04 MED ORDER — ALBUTEROL SULFATE HFA 108 (90 BASE) MCG/ACT IN AERS: INHALATION_SPRAY | RESPIRATORY_TRACT | 0 refills | Status: DC

## 2018-03-04 NOTE — Telephone Encounter (Signed)
Changed to Ventolin as requested

## 2018-03-05 NOTE — Therapy (Signed)
[facility] [address] [place], [place], [address] Phone: [phone]   Fax:  [phone]  Patient Details  Name: Molly [name] MRN: [id number] Date of Birth: 1975-12-19 Referring Provider:  Alysia Penna, MD  Encounter Date: 03/05/2018   SPEECH THERAPY DISCHARGE SUMMARY  Visits from Start of Care: 6  Current functional level related to goals / functional outcomes: Pt arrived to 6 therapy sessions, and was not always 100% compliant with recommendations/suggestions from previous sessions. 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 Pt's goals on her last therapy session were as follows:  SLP Short Term Goals - 11/10/16 1702              SLP SHORT TERM GOAL #1    Title pt will demo sustained attention to complex conversation for 15 minutes     Status Achieved         SLP SHORT TERM GOAL #2    Title pt will follow verbal commands with modified independence    Status Achieved         SLP SHORT TERM GOAL #3    Title pt will demo memory compensations during 4 therapy sessions    Baseline 10/26/16     Status Deferred  pt would like to work solely on attention at this time stating that her memory compensations work for her         [place] #4    Title pt will demo alternating attention between two simple linguistic tasks with 95% success on both, over two sessions    Time 1    Period Weeks    Status New                       SLP Long Term Goals - 11/10/16 1703              SLP [place] #1    Title pt will demo memory compensations during 8 sessions    Baseline none provided today    Status Deferred  due to pt stating memory compensations work for her and she'd like to focus on attention skills          SLP [place] #2    Title pt will demo alternating attention between two min-mod complex cognitive linguistic  tasks to achieve 90% success on both with extra time if needed    Baseline sustained attention during serial subtraction task approx 20 seconds    Time 4    Period Weeks    Status Revised         SLP LONG TERM GOAL #3    Title pt will demo compensations for attention PRN    Time 4    Period Weeks    Status Revised         SLP LONG TERM GOAL #4    Title pt will divide attention between two simple tasks to achieve 85% success on each task with extra time if needed, over three sessions    Time 4    Period Weeks    Status Revised      Remaining deficits: Memory/cognitive communication deficits.   Education / Equipment: Compensations for [employer], apps for cognitive communication.   Plan: Patient agrees to discharge.  Patient goals were not met. Patient is being discharged due to not returning since the last visit.  ?????At this time pt will  be formally discharged.        [facility] ,[place], [place]  03/05/2018, 2:38 PM  [facility] [address] [place], [place], [address] Phone: [phone]   Fax:  [phone]

## 2018-03-08 ENCOUNTER — Ambulatory Visit: Payer: Medicare Other | Admitting: Adult Health

## 2018-03-09 ENCOUNTER — Emergency Department (HOSPITAL_COMMUNITY): Payer: Medicare Other

## 2018-03-09 ENCOUNTER — Encounter (HOSPITAL_COMMUNITY): Payer: Self-pay | Admitting: Emergency Medicine

## 2018-03-09 ENCOUNTER — Emergency Department (HOSPITAL_COMMUNITY)
Admission: EM | Admit: 2018-03-09 | Discharge: 2018-03-09 | Discharge status: Against Medical Advice | Payer: Medicare Other | Attending: Emergency Medicine | Admitting: Emergency Medicine

## 2018-03-09 DIAGNOSIS — Z79899 Other long term (current) drug therapy: Secondary | ICD-10-CM | POA: Insufficient documentation

## 2018-03-09 DIAGNOSIS — R112 Nausea with vomiting, unspecified: Secondary | ICD-10-CM | POA: Diagnosis not present

## 2018-03-09 DIAGNOSIS — I10 Essential (primary) hypertension: Secondary | ICD-10-CM | POA: Insufficient documentation

## 2018-03-09 DIAGNOSIS — R0602 Shortness of breath: Secondary | ICD-10-CM | POA: Diagnosis not present

## 2018-03-09 DIAGNOSIS — M549 Dorsalgia, unspecified: Secondary | ICD-10-CM | POA: Diagnosis present

## 2018-03-09 DIAGNOSIS — R5383 Other fatigue: Secondary | ICD-10-CM | POA: Diagnosis not present

## 2018-03-09 DIAGNOSIS — R079 Chest pain, unspecified: Secondary | ICD-10-CM | POA: Insufficient documentation

## 2018-03-09 DIAGNOSIS — Z5320 Procedure and treatment not carried out because of patient's decision for unspecified reasons: Secondary | ICD-10-CM

## 2018-03-09 DIAGNOSIS — Z5329 Procedure and treatment not carried out because of patient's decision for other reasons: Secondary | ICD-10-CM

## 2018-03-09 LAB — CBC
HCT: 35.7 % — ABNORMAL LOW (ref 36.0–46.0)
Hemoglobin: 11.6 g/dL — ABNORMAL LOW (ref 12.0–15.0)
MCH: 30.4 pg (ref 26.0–34.0)
MCHC: 32.5 g/dL (ref 30.0–36.0)
MCV: 93.5 fL (ref 78.0–100.0)
PLATELETS: 246 10*3/uL (ref 150–400)
RBC: 3.82 MIL/uL — AB (ref 3.87–5.11)
RDW: 13.1 % (ref 11.5–15.5)
WBC: 4.7 10*3/uL (ref 4.0–10.5)

## 2018-03-09 LAB — URINALYSIS, ROUTINE W REFLEX MICROSCOPIC
BACTERIA UA: NONE SEEN
BILIRUBIN URINE: NEGATIVE
Glucose, UA: NEGATIVE mg/dL
Hgb urine dipstick: NEGATIVE
KETONES UR: NEGATIVE mg/dL
LEUKOCYTES UA: NEGATIVE
NITRITE: NEGATIVE
PROTEIN: NEGATIVE mg/dL
SPECIFIC GRAVITY, URINE: 1.003 — AB (ref 1.005–1.030)
pH: 9 — ABNORMAL HIGH (ref 5.0–8.0)

## 2018-03-09 LAB — BASIC METABOLIC PANEL
Anion gap: 10 (ref 5–15)
BUN: 9 mg/dL (ref 6–20)
CHLORIDE: 101 mmol/L (ref 101–111)
CO2: 27 mmol/L (ref 22–32)
CREATININE: 0.92 mg/dL (ref 0.44–1.00)
Calcium: 9.2 mg/dL (ref 8.9–10.3)
GFR calc non Af Amer: >60 mL/min (ref >60)
Glucose, Bld: 90 mg/dL (ref 65–99)
POTASSIUM: 3.9 mmol/L (ref 3.5–5.1)
SODIUM: 138 mmol/L (ref 135–145)

## 2018-03-09 LAB — HEPATIC FUNCTION PANEL
ALBUMIN: 3.7 g/dL (ref 3.5–5.0)
ALK PHOS: 71 U/L (ref 38–126)
ALT: 26 U/L (ref 14–54)
AST: 30 U/L (ref 15–41)
Bilirubin, Direct: 0.2 mg/dL (ref 0.1–0.5)
Indirect Bilirubin: 0.6 mg/dL (ref 0.3–0.9)
TOTAL PROTEIN: 7 g/dL (ref 6.5–8.1)
Total Bilirubin: 0.8 mg/dL (ref 0.3–1.2)

## 2018-03-09 LAB — I-STAT TROPONIN, ED: Troponin i, poc: 0 ng/mL (ref 0.00–0.08)

## 2018-03-09 LAB — LIPASE, BLOOD: LIPASE: 35 U/L (ref 11–51)

## 2018-03-09 MED ORDER — MORPHINE SULFATE (PF) 4 MG/ML IV SOLN
4.0000 mg | Freq: Once | INTRAVENOUS | Status: DC
  Filled 2018-03-09: qty 1

## 2018-03-09 MED ORDER — SODIUM CHLORIDE 0.9 % IV BOLUS
1000.0000 mL | Freq: Once | INTRAVENOUS | Status: AC
  Administered 2018-03-09: 1000 mL via INTRAVENOUS

## 2018-03-09 MED ORDER — ONDANSETRON HCL 4 MG/2ML IJ SOLN
4.0000 mg | Freq: Once | INTRAMUSCULAR | Status: AC
  Administered 2018-03-09: 4 mg via INTRAVENOUS
  Filled 2018-03-09: qty 2

## 2018-03-09 NOTE — ED Notes (Signed)
Upon entering the room, writer noticed patient was taking herself off of the cardiac monitor. The EMT asked if patient had decided to leave, pt responded "yes, because I had a bad experience the last time I was admitted here for a blood clot". This EMT removed patient IV. Will notify RN.

## 2018-03-09 NOTE — ED Triage Notes (Addendum)
Pt had recent diagnosis of strep and pneumonia, completed antibiotics X 2. Today c.o. Right breast pain radiating into right shoulder, pain worse with deep breaths. Pt also states every time she coughs she is vomiting and cannot keep anything, including her meds down. Left lung sounds are diminished.
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
## 2018-03-09 NOTE — ED Notes (Signed)
Patient leaving AMA - MD notified about patient not wanting CT scan or urine done, as MD went to speak with patient, patient already walking toward front entrance. MD spoke with patient regarding leaving against medical advice and verbally stated that she understood risk. Patient ambulatory to ED entrance with steady gait. Refused further vital signs or to sign.

## 2018-03-09 NOTE — ED Notes (Signed)
Patient requested pain medication - after offering her ordered morphine, patient stated, "that's not going to do anything for me," and refused. Explained to patient that a different IV would need to be started for a CT angio and patient stated that she can only have IVs in her hand and stated that she doesn't want the CT scan. When asked to elaborate, patient stated "they just couldn't get the second view for the x-ray because I couldn't take a deep breath." Patient added that she just wanted to leave. Will notify MD.

## 2018-03-09 NOTE — ED Notes (Signed)
MD speaking with patient

## 2018-03-10 LAB — URINE CULTURE: Culture: NO GROWTH

## 2018-03-11 ENCOUNTER — Telehealth: Payer: Self-pay | Admitting: Adult Health

## 2018-03-11 NOTE — Telephone Encounter (Signed)
Copied from [place] [id number]. Topic: Quick Communication - See Telephone Encounter >> Mar 11, 2018  1:51 PM Boyd Kerbs wrote: CRM for notification.   Pt. Asking for referral for Lung dr. (Pulmonary)   See Telephone encounter for: 03/11/18.

## 2018-03-11 NOTE — Telephone Encounter (Signed)
Call was transferred to the Clinical RN supervisor  here at [facility] to triage patient.

## 2018-03-11 NOTE — Telephone Encounter (Signed)
If patient is having chest pain and dyspnea- and concern was to rule out PE, then I don't think follow up here is in her best interest.  We would not be able to get CT today- mostly likely and this should not be delayed.  If she has PE could be deadly.

## 2018-03-11 NOTE — Telephone Encounter (Signed)
Cory-FYI.  Advised patient to return to ED per advice of Dr. Elease Hashimoto to complete CT scan and discussed possible causes of symptoms and risks of delaying treatment, including death. Patient verbalized understanding of instructions and risks. Patient is not sure if she's going to go because "I'm indecisive right now."  Encouraged patient not to delay diagnosis/treatment and to return to ED and patient states she will think about it.

## 2018-03-11 NOTE — Telephone Encounter (Signed)
Transferred patient to [place] for triage and appt due to patient just being diagnosed with pneumonia.  Was recently seen in ED.  Patient states she currently is having difficulty breathing and Chest pain/ fluttering.

## 2018-03-11 NOTE — Telephone Encounter (Signed)
Noted   Misty, Can we follow up with this on Tuesday

## 2018-03-11 NOTE — Telephone Encounter (Signed)
I was asked to triage patient by Aggie Hacker, RN.  Patient was seen in ED on 03/09/2018 for R sided chest pain and N/V (notes in chart). CT was recommended to r/o PE, but pt left AMA prior to having scan. States fear as reason for leaving- "I was afraid. I had an IV in my hand and they told me I would have to have another one in my arm."  Currently, she c/o "chest flutters" and pain under R breast radiating towards back. Pain is constant and worsens with taking a deep breath. Rated 10/10. Patient thinks pain is from coughing, but does feel her cough has improved slightly. Patient also reports vomiting "green" x1 week anytime after she eats-"I can't hold nothing down." Reports feeling better since receiving IVF in ED yesterday.  Patient would like to have CT scan completed as outpatient. She does understand that IV in her arm may be necessary for contrast administration.  Discussed w/ Dr. Sarajane Jews. Given patient's recent hx of PNA, persistent symptoms, and last CXR 2 days ago, recommends same day OV.

## 2018-03-12 ENCOUNTER — Encounter: Payer: Self-pay | Admitting: Adult Health

## 2018-03-12 ENCOUNTER — Ambulatory Visit (INDEPENDENT_AMBULATORY_CARE_PROVIDER_SITE_OTHER)
Admission: RE | Admit: 2018-03-12 | Discharge: 2018-03-12 | Disposition: A | Discharge status: Home / Self Care | Payer: Medicare Other | Source: Ambulatory Visit | Attending: Adult Health | Admitting: Adult Health

## 2018-03-12 ENCOUNTER — Ambulatory Visit (INDEPENDENT_AMBULATORY_CARE_PROVIDER_SITE_OTHER): Payer: Medicare Other | Admitting: Adult Health

## 2018-03-12 VITALS — BP 104/80 | HR 80 | Temp 98.4°F

## 2018-03-12 DIAGNOSIS — R079 Chest pain, unspecified: Secondary | ICD-10-CM

## 2018-03-12 DIAGNOSIS — M79601 Pain in right arm: Secondary | ICD-10-CM

## 2018-03-12 DIAGNOSIS — R0602 Shortness of breath: Secondary | ICD-10-CM | POA: Diagnosis not present

## 2018-03-12 DIAGNOSIS — R05 Cough: Secondary | ICD-10-CM | POA: Diagnosis not present

## 2018-03-12 DIAGNOSIS — R0600 Dyspnea, unspecified: Secondary | ICD-10-CM | POA: Diagnosis not present

## 2018-03-12 MED ORDER — ACETAMINOPHEN-CODEINE 300-60 MG PO TABS: 1.0000 | ORAL_TABLET | ORAL | 0 refills | Status: AC | PRN

## 2018-03-12 MED ORDER — IOPAMIDOL (ISOVUE-370) INJECTION 76%
80.0000 mL | Freq: Once | INTRAVENOUS | Status: AC | PRN
  Administered 2018-03-12: 80 mL via INTRAVENOUS

## 2018-03-12 NOTE — Telephone Encounter (Signed)
Noted  

## 2018-03-12 NOTE — Telephone Encounter (Signed)
Spoke to patient and informed her of her T scan.  Negative for pulmonary embolism.  Pain is likely ocular from cough.  Will send in Tylenol with codeine to help with cough suppression and muscular pain. Advised follow up as needed

## 2018-03-12 NOTE — Telephone Encounter (Signed)
Spoke to the pt.  She continues to have pain under breast and pain with deep breathing.  Does state this is better today.  Pt is convinced sx are from all the coughing.  States she is still coughing a lot but this is better too.  She does not want to go to ED.  Prefers to be seen in [place] with [facility].  Appt scheduled 03/12/18 @ 1 PM.  Will forward to [facility] as FYI.
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
## 2018-03-13 ENCOUNTER — Other Ambulatory Visit: Payer: Self-pay | Admitting: Adult Health

## 2018-03-13 NOTE — Telephone Encounter (Signed)
Sent to the pharmacy by e-scribe. 

## 2018-03-14 ENCOUNTER — Inpatient Hospital Stay: Payer: Medicare Other | Admitting: Adult Health

## 2018-03-21 ENCOUNTER — Encounter: Payer: Self-pay | Admitting: Podiatry

## 2018-03-21 ENCOUNTER — Telehealth: Payer: Self-pay | Admitting: Family Medicine

## 2018-03-21 ENCOUNTER — Other Ambulatory Visit: Payer: Self-pay | Admitting: Adult Health

## 2018-03-21 ENCOUNTER — Ambulatory Visit (INDEPENDENT_AMBULATORY_CARE_PROVIDER_SITE_OTHER): Payer: Medicare Other | Admitting: Podiatry

## 2018-03-21 DIAGNOSIS — M2042 Other hammer toe(s) (acquired), left foot: Secondary | ICD-10-CM

## 2018-03-21 DIAGNOSIS — M2041 Other hammer toe(s) (acquired), right foot: Secondary | ICD-10-CM

## 2018-03-21 DIAGNOSIS — G4700 Insomnia, unspecified: Secondary | ICD-10-CM

## 2018-03-21 NOTE — Progress Notes (Signed)
She presents today for follow-up of painful fifth toes bilateral.  She states that she does want me to take another look within make sure they are doing okay.  Objective: Vital signs stable she is alert and oriented x3.  Pulses are palpable adductovarus rotated hammertoe deformities appear to be rigid in nature and very prominent proximal interphalangeal joint.  She also has some lesser digital deformities as well #3 #4 and #2 on the left foot.  Assessment: Hammertoe deformities fifth bilateral.  Plan: Discussed padding discussed appropriate need for surgical intervention and I will follow-up with her at the end of July 1 August for surgery in August.

## 2018-03-21 NOTE — Telephone Encounter (Signed)
Coughing can linger for 4-5 weeks after being treated. Mucinex will help

## 2018-03-21 NOTE — Telephone Encounter (Signed)
Copied from [place] [phone]. Topic: General - Other >> Mar 21, 2018  1:35 PM Carolyn Stare wrote:  Pt is asking how long should recovering from pneumonia last cause she still has phelm coming up.   [phone]

## 2018-03-22 NOTE — Telephone Encounter (Signed)
Tried to reach the pt by telephone.  Received automated message that she is not available.  Unable to leave a message.  CRM created.

## 2018-03-26 NOTE — Telephone Encounter (Signed)
Tried reaching the pt by telephone.  Received an automated message that she is not available.  Unable to leave a message.  Will try again at a later time.

## 2018-03-27 NOTE — Telephone Encounter (Signed)
Was able to reach the pt.  She said cough is now intermittent.  She continues to have the pain in her chest and back.  Advised that she may have a pulled muscle as last x-rays have been normal.  Advised ibuprofen/Tylenol as needed and a heating pad.  Pt states that is what she has been doing.  Also asked if she is using inhaler as it may help with cough.  She said she did not pick it up since it was not covered.  Informed her that we sent in a different prescription for her to pick up.  She will call the pharmacy and let me know whether or not to resent inhaler.  Will check with [name] to see if there is anything further to do for rt side chest/back pain.

## 2018-03-28 ENCOUNTER — Other Ambulatory Visit: Payer: Self-pay | Admitting: Adult Health

## 2018-03-28 MED ORDER — ACETAMINOPHEN-CODEINE #3 300-30 MG PO TABS: 1.0000 | ORAL_TABLET | Freq: Four times a day (QID) | ORAL | 0 refills | Status: AC | PRN

## 2018-03-28 NOTE — Telephone Encounter (Signed)
Likely due to irritation from coughing. We can try a short course of Tylenol 3 which will help with cough and pain

## 2018-03-28 NOTE — Telephone Encounter (Signed)
Tried to reach the pt.  Received a message that her voicemail box has not been set up.

## 2018-03-28 NOTE — Telephone Encounter (Signed)
Spoke to the pt.  She has picked up the inhaler and will begin using it today.  She would like short term rx of Tylenol #3 sent to the pharmacy.

## 2018-04-01 DIAGNOSIS — F411 Generalized anxiety disorder: Secondary | ICD-10-CM | POA: Diagnosis not present

## 2018-04-01 DIAGNOSIS — F3181 Bipolar II disorder: Secondary | ICD-10-CM | POA: Diagnosis not present

## 2018-04-05 ENCOUNTER — Telehealth: Payer: Self-pay | Admitting: Adult Health

## 2018-04-05 NOTE — Telephone Encounter (Signed)
Patient needs an emailed letter sent to her gym for not being charged while she is dealing with the after effects of pneumonia.  She pulled a muscle in her chest and back while coughing so can't go to the gym to workout.  Email address: [email or web address] .Molly [name]- [phone]

## 2018-04-09 ENCOUNTER — Telehealth: Payer: Self-pay | Admitting: Family Medicine

## 2018-04-09 NOTE — Telephone Encounter (Signed)
[place] for note. 1 month out due to complications from pneumonia

## 2018-04-09 NOTE — Telephone Encounter (Signed)
Copied from [place] [id number]. Topic: Appointment Scheduling - Scheduling Inquiry for Clinic >> Apr 09, 2018  3:24 PM Arletha Grippe wrote: Reason for CRM: pt needs letter from dr to be sent to [facility] to excuse her from membership until September  Cb is [phone]

## 2018-04-09 NOTE — Telephone Encounter (Signed)
Copy and pasted to other note.

## 2018-04-09 NOTE — Telephone Encounter (Signed)
Nimmons, Emilio Math, RN routed conversation to You 9 minutes ago (3:36 PM)    Nimmons, Emilio Math, RN 10 minutes ago (3:36 PM)      Copied from [place] [phone]. Topic: Appointment Scheduling - Scheduling Inquiry for Clinic >> Apr 09, 2018  3:24 PM Arletha Grippe wrote: Reason for CRM: pt needs letter from dr to be sent to [facility] to excuse her from membership until September  Cb is [phone]

## 2018-04-11 NOTE — Telephone Encounter (Signed)
Tried to reach the pt by telephone.  Received a message that the voicemail box has not been set up.  Will try again at a later time. 

## 2018-04-12 NOTE — Telephone Encounter (Signed)
Tried to reach the pt by telephone.  Received a message that the voicemail box has not been set up.  Will try again at a later time. 

## 2018-04-16 ENCOUNTER — Telehealth: Payer: Self-pay | Admitting: Podiatry

## 2018-04-16 NOTE — Telephone Encounter (Signed)
Fine with me

## 2018-04-16 NOTE — Telephone Encounter (Signed)
Spoke to the pt and informed her that Molly [name] has authorized a 1 month write out.  Pt says she continues to have Cough, with pain in her chest and back and palpitations.  Also having some trouble with her feet.  Encouraged pt to come in and speak to [facility] and she agreed.  Scheduled to see [facility] 04/17/18 @ 10 AM.  Will discuss further write out at that time.  No further action required.

## 2018-04-16 NOTE — Telephone Encounter (Signed)
Patients wants a note for the gym she goes to so she can be put on a pending status. If you can call patient back at [phone]

## 2018-04-17 ENCOUNTER — Encounter: Payer: Self-pay | Admitting: Podiatry

## 2018-04-17 ENCOUNTER — Encounter: Payer: Self-pay | Admitting: *Deleted

## 2018-04-17 ENCOUNTER — Encounter: Payer: Self-pay | Admitting: Adult Health

## 2018-04-17 ENCOUNTER — Encounter: Payer: Self-pay | Admitting: Family Medicine

## 2018-04-17 ENCOUNTER — Ambulatory Visit (INDEPENDENT_AMBULATORY_CARE_PROVIDER_SITE_OTHER): Payer: Medicare Other | Admitting: Adult Health

## 2018-04-17 VITALS — BP 108/74 | Temp 98.4°F | Wt 142.0 lb

## 2018-04-17 DIAGNOSIS — R1011 Right upper quadrant pain: Secondary | ICD-10-CM

## 2018-04-17 DIAGNOSIS — R05 Cough: Secondary | ICD-10-CM | POA: Diagnosis not present

## 2018-04-17 DIAGNOSIS — R053 Chronic cough: Secondary | ICD-10-CM

## 2018-04-17 MED ORDER — PREDNISONE 20 MG PO TABS: 20.0000 mg | ORAL_TABLET | Freq: Every day | ORAL | 0 refills | Status: DC
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
## 2018-04-17 NOTE — Telephone Encounter (Signed)
Pt states needs note to pend gym status at [facility] until the end of September. Pt would like note addressed to Barrett Henle and she will pick up.

## 2018-04-23 ENCOUNTER — Ambulatory Visit: Payer: Medicaid Other | Admitting: Gastroenterology

## 2018-04-24 ENCOUNTER — Other Ambulatory Visit: Payer: Self-pay | Admitting: Adult Health

## 2018-04-24 NOTE — Telephone Encounter (Signed)
Ok to refill + 2  

## 2018-04-24 NOTE — Telephone Encounter (Signed)
Sent to the pharmacy by e-scribe. 

## 2018-04-26 ENCOUNTER — Ambulatory Visit: Payer: Medicaid Other | Admitting: Neurology

## 2018-04-29 ENCOUNTER — Other Ambulatory Visit: Payer: Medicare Other

## 2018-05-02 ENCOUNTER — Ambulatory Visit (INDEPENDENT_AMBULATORY_CARE_PROVIDER_SITE_OTHER): Payer: Medicare Other | Admitting: Podiatry

## 2018-05-02 ENCOUNTER — Encounter: Payer: Self-pay | Admitting: Podiatry

## 2018-05-02 DIAGNOSIS — M2041 Other hammer toe(s) (acquired), right foot: Secondary | ICD-10-CM

## 2018-05-02 DIAGNOSIS — M2042 Other hammer toe(s) (acquired), left foot: Secondary | ICD-10-CM

## 2018-05-02 NOTE — Progress Notes (Signed)
She presents today for surgical consult which we have discussed multiple times previously.  She is complaining of painful toes fifth bilateral and elevated third digit of the left foot.  Objective: Vital signs are stable she is alert and oriented x3 she has pain on palpation to the fifth digits which appear to be rigidly deformed with an adductovarus rotation.  She does have contracture of the third toe of the left foot at the level of the metatarsal phalangeal joint which appears to be flexible in nature.  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  Plan: Discussed etiology pathology conservative versus surgical therapies at this point in time discussed in great detail derotational arthroplasties fifth toes bilateral.  She understands this and is amenable to it.  Discussed possible side effects which may include but not limited to postop pain bleeding swelling infection recurrence need further surgery loss of digit loss of limb loss of life delayed healing.  She understands this is amenable to it signed all 3 pages a consent form.  We also will perform a extensor tenotomy to the third digit of the left foot.  Discussed this in great detail with her she understands and is amenable to it as well.  We provided her with both oral and written instruction for the [place] of her preop as well as the surgery center and an same anesthesia.

## 2018-05-02 NOTE — Patient Instructions (Signed)
Pre-Operative Instructions  Congratulations, you have decided to take an important step towards improving your quality of life.  You can be assured that the doctors and staff at [facility] will be with you every step of the way.  Here are some important things you should know:  1. Plan to be at the surgery center/hospital at least 1 (one) hour prior to your scheduled time, unless otherwise directed by the surgical center/hospital staff.  You must have a responsible adult accompany you, remain during the surgery and drive you home.  Make sure you have directions to the surgical center/hospital to ensure you arrive on time. 2. If you are having surgery at [facility] or [facility], you will need a copy of your medical history and physical form from your family physician within one month prior to the date of surgery. We will give you a form for your primary physician to complete.  3. We make every effort to accommodate the date you request for surgery.  However, there are times where surgery dates or times have to be moved.  We will contact you as soon as possible if a change in schedule is required.   4. No aspirin/ibuprofen for one week before surgery.  If you are on aspirin, any non-steroidal anti-inflammatory medications (Mobic, Aleve, Ibuprofen) should not be taken seven (7) days prior to your surgery.  You make take Tylenol for pain prior to surgery.  5. Medications - If you are taking daily heart and blood pressure medications, seizure, reflux, allergy, asthma, anxiety, pain or diabetes medications, make sure you notify the surgery center/hospital before the day of surgery so they can tell you which medications you should take or avoid the day of surgery. 6. No food or drink after midnight the night before surgery unless directed otherwise by surgical center/hospital staff. 7. No alcoholic beverages 24-hours prior to surgery.  No smoking 24-hours prior or 24-hours after  surgery. 8. Wear loose pants or shorts. They should be loose enough to fit over bandages, boots, and casts. 9. Don't wear slip-on shoes. Sneakers are preferred. 10. Bring your boot with you to the surgery center/hospital.  Also bring crutches or a walker if your physician has prescribed it for you.  If you do not have this equipment, it will be provided for you after surgery. 11. If you have not been contacted by the surgery center/hospital by the day before your surgery, call to confirm the date and time of your surgery. 12. Leave-time from work may vary depending on the type of surgery you have.  Appropriate arrangements should be made prior to surgery with your employer. 13. Prescriptions will be provided immediately following surgery by your doctor.  Fill these as soon as possible after surgery and take the medication as directed. Pain medications will not be refilled on weekends and must be approved by the doctor. 14. Remove nail polish on the operative foot and avoid getting pedicures prior to surgery. 15. Wash the night before surgery.  The night before surgery wash the foot and leg well with water and the antibacterial soap provided. Be sure to pay special attention to beneath the toenails and in between the toes.  Wash for at least three (3) minutes. Rinse thoroughly with water and dry well with a towel.  Perform this wash unless told not to do so by your physician.  Enclosed: 1 Ice pack (please put in freezer the night before surgery)   1 Hibiclens skin cleaner     Pre-op instructions  If you have any questions regarding the instructions, please do not hesitate to call our office.  [place]: [address], [place], [place] [address] -- [phone]  [place]: [address] [place], [place] [address] -- [phone]  [place]: [address]  [place], [place] [address] -- [phone]  [place]: [address], [address], [place], [place] [address] -- [phone]  Website:  [email or web address] 

## 2018-05-12 DIAGNOSIS — R21 Rash and other nonspecific skin eruption: Secondary | ICD-10-CM | POA: Diagnosis not present

## 2018-05-12 DIAGNOSIS — S70361A Insect bite (nonvenomous), right thigh, initial encounter: Secondary | ICD-10-CM | POA: Diagnosis not present

## 2018-05-13 DIAGNOSIS — F411 Generalized anxiety disorder: Secondary | ICD-10-CM | POA: Diagnosis not present

## 2018-05-13 DIAGNOSIS — F3181 Bipolar II disorder: Secondary | ICD-10-CM | POA: Diagnosis not present

## 2018-05-21 DIAGNOSIS — R21 Rash and other nonspecific skin eruption: Secondary | ICD-10-CM | POA: Diagnosis not present

## 2018-05-21 DIAGNOSIS — H9201 Otalgia, right ear: Secondary | ICD-10-CM | POA: Diagnosis not present

## 2018-05-21 DIAGNOSIS — R070 Pain in throat: Secondary | ICD-10-CM | POA: Diagnosis not present

## 2018-05-21 DIAGNOSIS — R5383 Other fatigue: Secondary | ICD-10-CM | POA: Diagnosis not present

## 2018-05-21 DIAGNOSIS — S70361D Insect bite (nonvenomous), right thigh, subsequent encounter: Secondary | ICD-10-CM | POA: Diagnosis not present

## 2018-05-23 ENCOUNTER — Ambulatory Visit: Payer: Self-pay

## 2018-05-23 NOTE — Telephone Encounter (Signed)
Patient called in with c/o "nausea." She says "I've been having a little nausea since last week, but this morning it was worse. I took a Zofran and Linzess, thinking it may be my bowels. I had a tick bite and went to UC, was started on Amoxicillin, which is finished. I just haven't felt good since then. I have a headache and my right ear aches." According to protocol, see PCP within 3 days, appointment scheduled for tomorrow at 1430 with Dorothyann Peng, NP, care advice given, patient verbalized understanding.   Reason for Disposition . Nausea lasts > 1 week  Answer Assessment - Initial Assessment Questions 1. NAUSEA SEVERITY: "How bad is the nausea?" (e.g., mild, moderate, severe; dehydration, weight loss)   - MILD: loss of appetite without change in eating habits   - MODERATE: decreased oral intake without significant weight loss, dehydration, or malnutrition   - SEVERE: inadequate caloric or fluid intake, significant weight loss, symptoms of dehydration     Moderate 2. ONSET: "When did the nausea begin?"     This morning 3. VOMITING: "Any vomiting?" If so, ask: "How many times today?"     No 4. RECURRENT SYMPTOM: "Have you had nausea before?" If so, ask: "When was the last time?" "What happened that time?"     No 5. CAUSE: "What do you think is causing the nausea?"     I don't know 6. PREGNANCY: "Is there any chance you are pregnant?" (e.g., unprotected intercourse, missed birth control pill, broken condom)     No  Protocols used: NAUSEA-A-AH

## 2018-05-24 ENCOUNTER — Telehealth: Payer: Self-pay | Admitting: *Deleted

## 2018-05-24 ENCOUNTER — Encounter: Payer: Self-pay | Admitting: Adult Health

## 2018-05-24 ENCOUNTER — Ambulatory Visit (INDEPENDENT_AMBULATORY_CARE_PROVIDER_SITE_OTHER): Payer: Medicare Other | Admitting: Adult Health

## 2018-05-24 VITALS — BP 108/80 | Temp 98.1°F | Wt 138.0 lb

## 2018-05-24 DIAGNOSIS — J029 Acute pharyngitis, unspecified: Secondary | ICD-10-CM

## 2018-05-24 DIAGNOSIS — H9201 Otalgia, right ear: Secondary | ICD-10-CM | POA: Diagnosis not present
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
## 2018-05-24 NOTE — Telephone Encounter (Signed)
Refill request for meloxicam. Pt is to be scheduled for surgery. Return fax denying.

## 2018-05-28 ENCOUNTER — Telehealth: Payer: Self-pay | Admitting: Adult Health

## 2018-05-28 NOTE — Telephone Encounter (Signed)
Copied from [place] [id number]. Topic: General - Other >> May 28, 2018  9:33 AM Cecelia Byars, NT wrote: Reason for CRM: Dr Scherry Ran office called and said they are unable to take the patient due to her having medicaid, they can if she is willing to pay what medicare does not ,she the patient stated she is not willing to pay the difference

## 2018-05-28 NOTE — Telephone Encounter (Signed)
Copied from [place] [id number]. Topic: General - Other >> May 28, 2018  9:31 AM Carolyn Stare wrote:  ENT office that pt was referred to does not take her secondary Medicaid and she said she can not pay no bill   >> May 28, 2018 10:20 AM Virl Cagey, CMA wrote: Pt advised that she needs to contact her insurance to see what office will accept her insurance if [facility] will not accept her Medicaid. Pt states that she was seen in the past by someone in [place] (with her Medicaid) >> May 28, 2018 10:26 AM Virl Cagey, CMA wrote: [facility] in [place] to see if patient has been previously seen in their office.   [facility]  Ear N[facility] & Throat  Address: [address], [place], [place] [address]  Phone: [phone]  Per Amy at [facility] they have never seen this patient  Amy states that there is another ENT that she might have seen.  [facility] ([phone] She has never been seen in this office per Apolonio Schneiders  Patient will need to call her insurance and or call around to see who accepts Medicaid and once we know we can place the referral.

## 2018-05-29 ENCOUNTER — Encounter: Payer: Medicare Other | Attending: Registered Nurse | Admitting: Registered Nurse

## 2018-05-30 ENCOUNTER — Encounter

## 2018-05-30 ENCOUNTER — Encounter: Payer: Self-pay | Admitting: Physician Assistant

## 2018-05-30 ENCOUNTER — Ambulatory Visit (INDEPENDENT_AMBULATORY_CARE_PROVIDER_SITE_OTHER): Payer: Medicare Other | Admitting: Physician Assistant

## 2018-05-30 VITALS — BP 122/74 | HR 88 | Ht 69.0 in | Wt 138.0 lb

## 2018-05-30 DIAGNOSIS — K5900 Constipation, unspecified: Secondary | ICD-10-CM

## 2018-05-30 DIAGNOSIS — K641 Second degree hemorrhoids: Secondary | ICD-10-CM | POA: Diagnosis not present

## 2018-05-30 DIAGNOSIS — K625 Hemorrhage of anus and rectum: Secondary | ICD-10-CM

## 2018-05-30 NOTE — Progress Notes (Signed)
Subjective:    Patient ID: Molly [name], female    DOB: [birth year], 42 y.o.   MRN: [id number]  HPI Molly [name] is a pleasant 42 year old African-American female known to Dr. Ardis Hughs and myself.  She has history of chronic constipation and internal hemorrhoids.  She had undergone colonoscopy in November 2018 with finding of small to medium internal hemorrhoids and otherwise negative exam. Also with history of migraines, seizure disorder, fibromyalgia bipolar disorder GERD and osteoarthritis. She was seen in the office in December 2018 after being referred to Dr. Arelia Longest for possible hemorrhoidal banding.  At that time she was noted to have prolapsed internal hemorrhoids grade 2 and was complaining of ongoing problems with significant constipation so he opted not to band her at that time and try to get the constipation under better control.  She was to try Trulance 3 mg once daily instead of Linzess 290 mcg daily.  She thinks she tried samples for short period of time and that they were helpful however her insurance would not cover.  Over the past 6 months since then she is been back on Linzess at 290 mcg daily and also taking MiraLAX once daily. She continues to have problems with constipation and even with that regimen may go 3 or 4 days without a bowel movement and when she does have a bowel movement frequently has very hard stools and a lot of straining. She has been having intermittent bleeding which is generally bright red.  She says she may bleed for a day or 2 with straining, then bleeding stops for a couple of weeks and then returns.  She has no current complaints of rectal pain or abdominal pain.  She says she was significantly constipated last week and wound up taking a couple of doses of MiraLAX, and enema and some Ex-Lax in addition to the [place] and finally was able to purge her bowel quite a bit and feels better currently. She does use hydrocodone on a as needed basis for flares of her  fibromyalgia and is on multiple other medications which may be contributing to the constipation including Lyrica Topamax and Elavil.  Review of Systems Pertinent positive and negative review of systems were noted in the above HPI section.  All other review of systems was otherwise negative.  Outpatient Encounter Medications as of 05/30/2018  Medication Sig  . albuterol (VENTOLIN HFA) 108 (90 Base) MCG/ACT inhaler TAKE 2 PUFFS BY MOUTH EVERY 6 HOURS AS NEEDED  . amitriptyline (ELAVIL) 25 MG tablet TAKE 1 TABLET BY MOUTH TWICE A DAY  . ammonium lactate (LAC-HYDRIN) 12 % lotion Apply 1 application topically as needed for dry skin.  Marland Kitchen amoxicillin (AMOXIL) 500 MG capsule TAKE 1 CAPSULE BY MOUTH THREE TIMES A DAY  . azelastine (OPTIVAR) 0.05 % ophthalmic solution PLACE 1 DROP INTO BOTH EYES 2 (TWO) TIMES DAILY.  Marland Kitchen CALCIUM PO Take by mouth every other day.  . cetirizine (ZYRTEC) 10 MG tablet TAKE 1 TABLET (10 MG TOTAL) BY MOUTH DAILY.  . cholecalciferol (VITAMIN D) 1000 units tablet Take 1 tablet (1,000 Units total) by mouth daily.  . cyclobenzaprine (FLEXERIL) 10 MG tablet Take 1 tablet (10 mg total) by mouth at bedtime.  . diclofenac (VOLTAREN) 75 MG EC tablet Take 1 tablet (75 mg total) by mouth 2 (two) times daily.  . diclofenac sodium (VOLTAREN) 1 % GEL Apply 2 g topically 4 (four) times daily.  Marland Kitchen econazole nitrate 1 % cream Apply topically daily.  Marland Kitchen EPIPEN 2-PAK 0.3  MG/0.3ML SOAJ injection INJECT 0.3ML INTO THE MUSCLE  . esomeprazole (NEXIUM) 20 MG capsule Take 1 capsule (20 mg total) by mouth daily.  . fluticasone (FLONASE) 50 MCG/ACT nasal spray SPRAY 2 SPRAYS INTO EACH NOSTRIL EVERY DAY  . HYDROcodone-acetaminophen (NORCO/VICODIN) 5-325 MG tablet Take 1 tablet by mouth every 6 (six) hours as needed for moderate pain.  Marland Kitchen Hyoscyamine Sulfate SL (LEVSIN/SL) 0.125 MG SUBL Place 1 tablet under the tongue 3 (three) times daily.  Marland Kitchen ibuprofen (ADVIL,MOTRIN) 800 MG tablet TAKE 1 TABLET BY MOUTH EVERY  8 HOURS AS NEEDED AS NEEDED PAIN AND SWELLING  . ketoconazole (NIZORAL) 2 % cream Apply 1 application topically daily.  . meloxicam (MOBIC) 15 MG tablet Take 1 tablet (15 mg total) by mouth daily.  . NEOMYCIN-POLYMYXIN-HYDROCORTISONE (CORTISPORIN) 1 % SOLN otic solution Apply 1-2 drops to toe BID after soaking  . NONFORMULARY OR COMPOUNDED ITEM [facility]: Antiinflammatory cream - Diclofenac 3%, Baclofen 2%, Lidocaine 2%, apply 1-2 grams to affected 3-4 times daily.  . ondansetron (ZOFRAN) 8 MG tablet Take 1 tablet (8 mg total) by mouth every 8 (eight) hours as needed for nausea or vomiting.  Marland Kitchen Plecanatide (TRULANCE) 3 MG TABS Take 3 mg by mouth daily.  . polyethylene glycol (MIRALAX / GLYCOLAX) packet MIX CONTENTS OF 1 PACKET WITH 4 TO 8 OUNCES OF LIQUID AND DRINK EVERY DAY  . predniSONE (DELTASONE) 20 MG tablet Take 1 tablet (20 mg total) by mouth daily with breakfast.  . pregabalin (LYRICA) 75 MG capsule Take 1 capsule (75 mg total) by mouth 2 (two) times daily.  . RABEprazole (ACIPHEX) 20 MG tablet Take 1 tablet (20 mg total) by mouth daily.  . rizatriptan (MAXALT-MLT) 10 MG disintegrating tablet Take one tablet by mouth. May repeat in 2 hours if needed. Do not exceed three times week.  . topiramate (TOPAMAX) 100 MG tablet Take 1.5 tablet twice day  . triamcinolone cream (KENALOG) 0.1 % APPLY TO AFFECTED AREA EVERY 8 HOURS  . valACYclovir (VALTREX) 1000 MG tablet Take 1 tablet (1,000 mg total) by mouth 3 (three) times daily.  Marland Kitchen zolmitriptan (ZOMIG) 5 MG nasal solution May repeat in 2 hours. Do not use more than 3 times a week.  . zolpidem (AMBIEN) 10 MG tablet TAKE 1 TABLET AS NEEDED AT BEDTIME   No facility-administered encounter medications on file as of 05/30/2018.    Allergies  Allergen Reactions  . Fish Allergy Anaphylaxis, Hives and Swelling  . Heparin Itching  . Coumadin [Warfarin Sodium] Hives  . Doxycycline Nausea And Vomiting  . Phenergan [Promethazine Hcl] Swelling  .  Toradol [Ketorolac Tromethamine] Hives  . Nystatin Itching and Rash    Blisters   Patient Active Problem List   Diagnosis Date Noted  . Insomnia 05/25/2016  . Memory loss 09/29/2015  . Pure hypercholesterolemia 06/30/2015  . Convulsion ([place]) 06/22/2015  . Cephalalgia 06/22/2015  . Chest pain at rest 06/22/2015    Class: Acute  . Bipolar disorder ([place]) 12/05/2013  . GERD (gastroesophageal reflux disease) 12/05/2013  . Fibromyalgia 12/05/2013  . Carpal tunnel syndrome 12/05/2013  . Osteoarthritis 12/05/2013  . Convulsions/seizures ([place]) 10/08/2013  . Migraine without aura 10/08/2013   Social History   Socioeconomic History  . Marital status: Legally Separated    Spouse name: Not on file  . Number of children: 2  . Years of education: 12+  . Highest education level: Not on file  Occupational History  . Occupation: unemployed  Social Needs  . Emergency planning/management officer  strain: Not on file  . Food insecurity:    Worry: Not on file    Inability: Not on file  . Transportation needs:    Medical: Not on file    Non-medical: Not on file  Tobacco Use  . Smoking status: Never Smoker  . Smokeless tobacco: Never Used  Substance and Sexual Activity  . Alcohol use: No    Alcohol/week: 0.0 oz  . Drug use: No  . Sexual activity: Not on file  Lifestyle  . Physical activity:    Days per week: Not on file    Minutes per session: Not on file  . Stress: Not on file  Relationships  . Social connections:    Talks on phone: Not on file    Gets together: Not on file    Attends religious service: Not on file    Active member of club or organization: Not on file    Attends meetings of clubs or organizations: Not on file    Relationship status: Not on file  . Intimate partner violence:    Fear of current or ex partner: Not on file    Emotionally abused: Not on file    Physically abused: Not on file    Forced sexual activity: Not on file  Other Topics Concern  . Not on file  Social  History Narrative   Is not currently working   Patient lives at home.    Patient has 2 children.    Patient has a college education.    Patient is right handed.     Ms. Quizhpi family history includes Bipolar disorder in her sister; Emphysema in her father; Seizures in her maternal grandmother, mother, and sister.      Objective:    Vitals:   05/30/18 0831  BP: 122/74  Pulse: 88    Physical Exam; well-developed African-American female in no acute distress, pleasant blood pressure 122/74 pulse 88, height 5 foot 9, weight 138, BMI 20.3.  HEENT; nontraumatic normocephalic EOMI PERRLA sclera anicteric, Cardiovascular; regular rate and rhythm with S1-S2 no murmur rub or gallop, Pulmonary; clear bilaterally, Abdomen; soft, nondistended nontender no palpable mass or hepatosplenomegaly bowel sounds are present, Rectal ;exam not done, Extremities; no clubbing cyanosis or edema skin warm dry, Neuro psych; mood and affect appropriate, alert and oriented grossly nonfocal       Assessment & Plan:   #46 42 year old African American female with chronic functional constipation who comes in today with persistent constipation despite taking MiraLAX daily and Linzess 290 mcg daily. I suspect she may have component of medication induced constipation as well, on multiple meds which may be contributing and uses hydrocodone periodically as well.  #2 intermittent bright red blood per rectum secondary to previously documented medium sized internal hemorrhoids. #3 fibromyalgia #4.  Seizure disorder #5.  Migraine headaches #6.  Bipolar disorder #7.  Osteoarthritis  Plan; Patient will increase MiraLAX to 17 g in 8 ounces of water twice daily. We discussed increasing daily water intake to 60 to 70 ounces per day. She was given 2 weeks worth of samples of true Lance 3 mg daily today.  Have  asked her to call back after she tries this for a couple of weeks and if effective we can try again to get prior  authorization through her insurance. We discussed hemorrhoidal banding again today.  She is not having significant amounts of bleeding only sees blood with bowel movements.  Hemorrhoids are not bothering her otherwise, and I do  not think she has to have definitive management/banding at this time.  She was reassured today that the bleeding is coming from hemorrhoids.  Molly [name] S Molly Accardi PA-C 05/30/2018   Cc: Dorothyann Peng, NP

## 2018-05-30 NOTE — Patient Instructions (Signed)
If you are age 40 or younger, your body mass index should be between 19-25. Your Body mass index is 20.38 kg/m. If this is out of the aformentioned range listed, please consider follow up with your Primary Care Provider.   Increase Miralax to twice daily. We have given you samples of "Trulance" . Take 1, ( 3 mg tablet ) once daily. 14 days worth.  Call us back if this Trulance is helping . Ask for Amy's nurse. [phone], choose option 2.  Increase your water intake to 60-70 oz daily.

## 2018-05-30 NOTE — Progress Notes (Signed)
I agree with the above note, plan 

## 2018-06-03 DIAGNOSIS — F411 Generalized anxiety disorder: Secondary | ICD-10-CM | POA: Diagnosis not present

## 2018-06-03 DIAGNOSIS — F3181 Bipolar II disorder: Secondary | ICD-10-CM | POA: Diagnosis not present

## 2018-06-13 ENCOUNTER — Ambulatory Visit: Payer: Medicare Other | Admitting: Podiatry

## 2018-06-13 ENCOUNTER — Other Ambulatory Visit: Payer: Self-pay | Admitting: Podiatry

## 2018-06-13 MED ORDER — CEPHALEXIN 500 MG PO CAPS: 500.0000 mg | ORAL_CAPSULE | Freq: Three times a day (TID) | ORAL | 0 refills | Status: DC

## 2018-06-13 MED ORDER — OXYCODONE-ACETAMINOPHEN 10-325 MG PO TABS: 1.0000 | ORAL_TABLET | Freq: Three times a day (TID) | ORAL | 0 refills | Status: DC | PRN

## 2018-06-13 MED ORDER — ONDANSETRON HCL 4 MG PO TABS: 4.0000 mg | ORAL_TABLET | Freq: Three times a day (TID) | ORAL | 0 refills | Status: DC | PRN

## 2018-06-14 ENCOUNTER — Encounter: Payer: Self-pay | Admitting: Podiatry

## 2018-06-14 DIAGNOSIS — M2042 Other hammer toe(s) (acquired), left foot: Secondary | ICD-10-CM | POA: Diagnosis not present

## 2018-06-14 DIAGNOSIS — M2041 Other hammer toe(s) (acquired), right foot: Secondary | ICD-10-CM | POA: Diagnosis not present

## 2018-06-14 DIAGNOSIS — E7800 Pure hypercholesterolemia, unspecified: Secondary | ICD-10-CM | POA: Diagnosis not present

## 2018-06-14 DIAGNOSIS — M24574 Contracture, right foot: Secondary | ICD-10-CM | POA: Diagnosis not present

## 2018-06-15 ENCOUNTER — Other Ambulatory Visit: Payer: Self-pay | Admitting: Adult Health

## 2018-06-16 ENCOUNTER — Other Ambulatory Visit: Payer: Self-pay | Admitting: Adult Health

## 2018-06-17 ENCOUNTER — Telehealth: Payer: Self-pay | Admitting: *Deleted

## 2018-06-17 NOTE — Telephone Encounter (Signed)
POST OP CALL-    1) General condition stated by the patient: HURTING  2) Is the pt having pain? A LOT  3) Pain score: 9  4) Has the pt taken Rx'd pain medication, regularly or PRN? REGULARLY  5) Is the pain medication giving relief? SOMETIMES  6) Any fever, chills, nausea, or vomiting, shortness of breath or tightness in calf? NO  7) Is the bandage clean, dry and intact? YES  8) Is there excessive tightness, bleeding or drainage coming through the bandage? NO  9) Did you understand all of the post op instruction sheet given? YES  10) Any questions or concerns regarding post op care/recovery? I TRIED TO GIVE PATIENT WAYS TO LOOSEN UP HER BANDAGES BUT SHE DIDN'T FEEL COMFORTABLE MESSING WITH THEM AND SAID SHE THINKS SHE WOULD BE OKAY UNTIL HER FIRST POV.     Confirmed POV appointment with patient

## 2018-06-18 NOTE — Telephone Encounter (Signed)
Due for cpx on/after 06/27/18

## 2018-06-19 NOTE — Telephone Encounter (Signed)
90 day supply sent to the pharmacy by e-scribe per [facility].  Pt just had surgery on her feet.

## 2018-06-19 NOTE — Telephone Encounter (Signed)
Sent to the pharmacy by e-scribe for 90 days per [facility].  Pt has just had surgery on her feet.

## 2018-06-20 ENCOUNTER — Ambulatory Visit (INDEPENDENT_AMBULATORY_CARE_PROVIDER_SITE_OTHER): Payer: Self-pay | Admitting: Podiatry

## 2018-06-20 ENCOUNTER — Ambulatory Visit (INDEPENDENT_AMBULATORY_CARE_PROVIDER_SITE_OTHER): Payer: Medicare Other

## 2018-06-20 VITALS — BP 116/85 | HR 80 | Temp 97.5°F

## 2018-06-20 DIAGNOSIS — M2042 Other hammer toe(s) (acquired), left foot: Secondary | ICD-10-CM

## 2018-06-20 DIAGNOSIS — M79676 Pain in unspecified toe(s): Secondary | ICD-10-CM

## 2018-06-20 DIAGNOSIS — M2041 Other hammer toe(s) (acquired), right foot: Secondary | ICD-10-CM

## 2018-06-20 MED ORDER — OXYCODONE-ACETAMINOPHEN 10-325 MG PO TABS
1.0000 | ORAL_TABLET | Freq: Three times a day (TID) | ORAL | 0 refills | Status: AC | PRN
Start: 2018-06-20 — End: 2018-06-27

## 2018-06-20 NOTE — Progress Notes (Signed)
She presents today status post tenotomy and hammertoe repair fifth bilateral.  Date of surgery June 14, 2018 states that she is in extreme pain.  Objective: Vital signs are stable she is alert and oriented x3 dry sterile dressing intact was removed demonstrates no erythema edema saline strange odor looks exactly like it did when I covered it up last week.  She has mild tenderness on palpation of these areas radiographs taken today demonstrate well healing surgical sites.  Assessment: Well-healing surgical toes.  Plan: Redressed today dressed a compressive dressing with another prescription for pain medicine.

## 2018-06-24 DIAGNOSIS — F3181 Bipolar II disorder: Secondary | ICD-10-CM | POA: Diagnosis not present

## 2018-06-24 DIAGNOSIS — F411 Generalized anxiety disorder: Secondary | ICD-10-CM | POA: Diagnosis not present

## 2018-06-27 ENCOUNTER — Ambulatory Visit (INDEPENDENT_AMBULATORY_CARE_PROVIDER_SITE_OTHER): Payer: Medicare Other

## 2018-06-27 DIAGNOSIS — M2042 Other hammer toe(s) (acquired), left foot: Secondary | ICD-10-CM | POA: Diagnosis not present

## 2018-06-27 DIAGNOSIS — M2041 Other hammer toe(s) (acquired), right foot: Secondary | ICD-10-CM

## 2018-07-03 NOTE — Progress Notes (Signed)
Patient presents today status post tenotomy and hammertoe repair fifth bilateral.  Date of surgery 06/14/2018.  She states that her pain is improving since last week, and she is able to walk around a little bit more.  Noted well-healing surgical sites, all sutures intact.  No erythema, no redness, no drainage, mild swelling to toes within normal limits for postoperative state.  Redressed with dry sterile gauze, discussed signs and symptoms of infection.  She is to remain in surgical shoes at all times, and keep her feet dry.  She is to follow-up next week for suture removal.

## 2018-07-04 ENCOUNTER — Ambulatory Visit (INDEPENDENT_AMBULATORY_CARE_PROVIDER_SITE_OTHER): Payer: Self-pay

## 2018-07-04 DIAGNOSIS — M2041 Other hammer toe(s) (acquired), right foot: Secondary | ICD-10-CM

## 2018-07-04 DIAGNOSIS — M2042 Other hammer toe(s) (acquired), left foot: Secondary | ICD-10-CM

## 2018-07-06 ENCOUNTER — Telehealth: Payer: Self-pay | Admitting: Podiatry

## 2018-07-06 NOTE — Telephone Encounter (Signed)
Patient called on 07/05/2018 at 8:37pm stating that she recently had sutures removed and was told she could shower. When she got out of the shower she noticed a scab came off and the incision was bleeding. She has wrapped the toe and it seems to be doing better. Informed her not to get it wet, keep it covered with antibiotic ointment and a bandage. Elevation, ice as well. I would be happy to meet her in the office tomorrow morning to evaluate it if needed and she is going to call me in the morning if needed. She denies any pus coming from the area. No injury.   I tried to call her on Saturday 07/06/2018 two times to check on her and no answer. Unable to leave voicemail as it has not been set up.   I will have Marylou Mccoy, RN call her Monday to check on her.   Trula Slade

## 2018-07-08 ENCOUNTER — Encounter: Payer: Self-pay | Admitting: Podiatry

## 2018-07-08 ENCOUNTER — Ambulatory Visit (INDEPENDENT_AMBULATORY_CARE_PROVIDER_SITE_OTHER): Payer: Medicare Other

## 2018-07-08 ENCOUNTER — Ambulatory Visit (INDEPENDENT_AMBULATORY_CARE_PROVIDER_SITE_OTHER): Payer: Self-pay | Admitting: Podiatry

## 2018-07-08 ENCOUNTER — Telehealth: Payer: Self-pay

## 2018-07-08 DIAGNOSIS — M2042 Other hammer toe(s) (acquired), left foot: Secondary | ICD-10-CM | POA: Diagnosis not present

## 2018-07-08 DIAGNOSIS — M2041 Other hammer toe(s) (acquired), right foot: Secondary | ICD-10-CM

## 2018-07-08 DIAGNOSIS — M79676 Pain in unspecified toe(s): Secondary | ICD-10-CM

## 2018-07-08 NOTE — Telephone Encounter (Signed)
Spoke with patient he states that over the weekend she hit her left fifth toe.  She also states that after she got out of the shower her right fifth toe started to bleed.  I advised her she needed to come in for an appointment so that we get x-ray and take a look at the incision.  Transfer to schedulers.

## 2018-07-08 NOTE — Progress Notes (Signed)
Patient called on Saturday afternoon stating that she hit her toe that she had surgery on and is concerned about it. She does not feel that she broke it but it hurts. I asked her if the toe has changed position and she states that she has not looked at it. I informed her to look at the toe and if it is not in proper position or any new open wounds she needs to go to urgent care/ER. She states that the bleeding stopped that she called about and the wound is doing well. If she is improving I will see her Monday in the office for evaluation/x-ray.   Molly [name]

## 2018-07-10 NOTE — Progress Notes (Signed)
Subjective: 42 year old female presents the office today for an acute appointment.  She called over the weekend and she had bleeding to her right fifth toe to stop and she also hit her left fifth toe she has been swelling.  She is having pain prior to the injury however if symptoms become more painful.  She is internal ice and elevate. Denies any systemic complaints such as fevers, chills, nausea, vomiting. No acute changes since last appointment, and no other complaints at this time.   Objective: AAO x3, NAD DP/PT pulses palpable bilaterally, CRT less than 3 seconds Incisions appear to be well coapted bilaterally there is no drainage or any evidence of bleeding today.  There is tenderness to bilateral fifth toes of the left side worse than right.  There is swelling on the left side worse than right as well.  There is no other area tenderness identified at this time.  No open lesions or pre-ulcerative lesions.  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  -X-rays were obtained reviewed.  Status post arthroplasty of the fifth toe.  No evidence of acute fracture today. -Recommend antibiotic ointment and a bandage on the fifth toes bilaterally. -Remain in surgical shoe bilaterally discussed ice elevation. -Pain medication as needed -Follow-up as scheduled with Dr. Milinda Pointer. -Patient encouraged to call the office with any questions, concerns, change in symptoms.   Trula Slade DPM

## 2018-07-13 ENCOUNTER — Other Ambulatory Visit: Payer: Self-pay | Admitting: Gastroenterology

## 2018-07-16 NOTE — Progress Notes (Signed)
Patient is here today for postop follow-up visit.  Recently had surgery post tenotomy and hammertoe repair fifth bilateral date of surgery 06/14/2018.  She states that her pain is beginning to improve and overall she is feeling better.  No well-healing surgical sites, very minimal swelling.  No erythema, no redness, no drainage, no other signs and symptoms of infection.  All sutures were removed, wound edges coapted aligned and approximated, no gapping noted.  Discussed with patient signs and symptoms of infection, she was also shown how to dress and wrapped each toe.  Advised her to keep her toes dry until 24 hours from now, she is to follow-up with any acute symptom changes otherwise at her regular scheduled appointment in 2 weeks.

## 2018-07-18 ENCOUNTER — Encounter: Payer: Self-pay | Admitting: Podiatry

## 2018-07-18 ENCOUNTER — Ambulatory Visit (INDEPENDENT_AMBULATORY_CARE_PROVIDER_SITE_OTHER): Payer: Medicare Other | Admitting: Podiatry

## 2018-07-18 DIAGNOSIS — M2042 Other hammer toe(s) (acquired), left foot: Secondary | ICD-10-CM | POA: Diagnosis not present

## 2018-07-18 DIAGNOSIS — M2041 Other hammer toe(s) (acquired), right foot: Secondary | ICD-10-CM

## 2018-07-18 DIAGNOSIS — Z9889 Other specified postprocedural states: Secondary | ICD-10-CM

## 2018-07-19 ENCOUNTER — Telehealth: Payer: Self-pay | Admitting: Podiatry

## 2018-07-19 NOTE — Telephone Encounter (Signed)
Pt would like percocet prescription, states was offered yesterday, but she declined.

## 2018-07-20 NOTE — Progress Notes (Signed)
She presents today for follow-up of her surgical fifth toes bilaterally.  Date of surgery 06/14/2018.  States that there is still sore in the bottom of the fifth metatarsal is sore.  Objective: Vital signs are stable she is alert and oriented x3.  There is no erythema edema cellulitis drainage or odor fifth toes are tender on palpation.  They appear to be healing very nicely but no erythema edema cellulitis drainage or odor no signs of infection.  Reviewed previous radiographs demonstrate a well-healing arthroplasties.  Assessment: Well-healing fifth toes bilateral.  Plan: Recommended that she get out of the cam walkers and get into a pair of tennis shoes sandals or flip-flops.  I will follow-up with her in a month or 2

## 2018-07-20 NOTE — Telephone Encounter (Signed)
I don't recall but she may have #10 of them and that is all.

## 2018-07-22 ENCOUNTER — Other Ambulatory Visit: Payer: Self-pay | Admitting: Adult Health

## 2018-07-22 ENCOUNTER — Telehealth: Payer: Self-pay

## 2018-07-22 DIAGNOSIS — M797 Fibromyalgia: Secondary | ICD-10-CM

## 2018-07-22 DIAGNOSIS — G4700 Insomnia, unspecified: Secondary | ICD-10-CM

## 2018-07-22 MED ORDER — OXYCODONE-ACETAMINOPHEN 5-325 MG PO TABS: 1.0000 | ORAL_TABLET | Freq: Three times a day (TID) | ORAL | 0 refills | Status: DC | PRN

## 2018-07-22 NOTE — Telephone Encounter (Signed)
Copied from [place] [phone]. Topic: Referral - Request >> Jul 22, 2018 11:57 AM Mylinda Latina, NT wrote: Reason for CRM: Patient would like to be referred to a pain clinic. [facility]. ( fax# [phone] Fredia Beets) Please call patient if and when this referral is placed CB# [phone]

## 2018-07-22 NOTE — Telephone Encounter (Signed)
I informed pt, Dr. Milinda Pointer had okayed a Percocet prescription and she would need to have someone pick up the rx to take to the pharmacy. Pt states she will send her oldest dtr. I told her that would be fine.

## 2018-07-23 NOTE — Telephone Encounter (Signed)
Ok to refer.

## 2018-07-23 NOTE — Telephone Encounter (Signed)
Referral placed and pt notified

## 2018-07-23 NOTE — Telephone Encounter (Signed)
Spoke to the pt.  She would like to be referred due to her fibromyalgia.  Please advise.

## 2018-07-24 ENCOUNTER — Encounter: Payer: Self-pay | Admitting: Adult Health

## 2018-07-24 ENCOUNTER — Ambulatory Visit (INDEPENDENT_AMBULATORY_CARE_PROVIDER_SITE_OTHER): Payer: Medicare Other | Admitting: Adult Health

## 2018-07-24 VITALS — BP 96/60 | Temp 98.2°F | Wt 137.0 lb

## 2018-07-24 DIAGNOSIS — M797 Fibromyalgia: Secondary | ICD-10-CM | POA: Diagnosis not present
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
## 2018-07-25 ENCOUNTER — Telehealth: Payer: Self-pay | Admitting: Podiatry

## 2018-07-25 NOTE — Telephone Encounter (Addendum)
Pt states her feet ache so much she can't walk, wear sandals and fuzzy socks and both the 5th toes are swollen, can't stand pajama pants to be pulled over. I asked pt if this was going on at her last appt and she said not. I told pt to continue to rest, elevate and I would speak with Dr. Milinda Pointer.

## 2018-07-25 NOTE — Telephone Encounter (Signed)
I told pt Dr. Milinda Pointer had wanted her to come. Pt asked what to do until she was seen and it told her to rest, ice and elevate as she has been.

## 2018-07-25 NOTE — Telephone Encounter (Signed)
Unable to leave a message on pt's home phone the mailbox had not been set up.

## 2018-07-25 NOTE — Telephone Encounter (Signed)
I need to know what I can do, you told me to put on sandles, but I cant stand for anything to touch my feet. I cant even wear the fuzzy socks.

## 2018-07-25 NOTE — Telephone Encounter (Signed)
Sure have her in.

## 2018-07-26 ENCOUNTER — Other Ambulatory Visit: Payer: Self-pay | Admitting: Adult Health

## 2018-07-29 ENCOUNTER — Telehealth: Payer: Self-pay | Admitting: *Deleted

## 2018-07-29 NOTE — Telephone Encounter (Signed)
Pt states she just got out to the tub and the toe looks like it has opened up, there is not blood, and she just patted the sides back down. I told pt to cover the area with dry gauze and we would see her tomorrow.

## 2018-07-30 ENCOUNTER — Ambulatory Visit (INDEPENDENT_AMBULATORY_CARE_PROVIDER_SITE_OTHER): Payer: Medicare Other | Admitting: Podiatry

## 2018-07-30 DIAGNOSIS — Z9889 Other specified postprocedural states: Secondary | ICD-10-CM

## 2018-07-30 DIAGNOSIS — M2042 Other hammer toe(s) (acquired), left foot: Secondary | ICD-10-CM

## 2018-07-30 DIAGNOSIS — M2041 Other hammer toe(s) (acquired), right foot: Secondary | ICD-10-CM

## 2018-07-31 ENCOUNTER — Encounter: Payer: Self-pay | Admitting: Adult Health

## 2018-07-31 ENCOUNTER — Ambulatory Visit (INDEPENDENT_AMBULATORY_CARE_PROVIDER_SITE_OTHER): Payer: Medicare Other | Admitting: Adult Health

## 2018-07-31 ENCOUNTER — Ambulatory Visit: Payer: Medicare Other | Admitting: Family Medicine

## 2018-07-31 VITALS — BP 92/60 | Temp 98.6°F | Wt 139.0 lb

## 2018-07-31 DIAGNOSIS — J069 Acute upper respiratory infection, unspecified: Secondary | ICD-10-CM

## 2018-07-31 DIAGNOSIS — J029 Acute pharyngitis, unspecified: Secondary | ICD-10-CM

## 2018-07-31 LAB — POCT RAPID STREP A (OFFICE): Rapid Strep A Screen: NEGATIVE
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
## 2018-07-31 NOTE — Progress Notes (Signed)
She presents today complaining of painful surgical toes.  Surgery was performed June 14, 2018.  She states that her toes are so painful she cannot even touch them.  Objective: Vital signs are stable she is alert and oriented x3 there is no erythema edema cellulitis drainage or odor there is no reason that these toes should be painful other than the fact that she has not been working with them or touching them.  They look better than expected for 6 weeks out.  There is tenderness on palpation.  Assessment: Well-healing surgical toes just painful.  Plan: I encouraged her to perform contrast baths massage the toes daily to help reduce the symptoms.  I did not give her any pain medication.

## 2018-08-01 ENCOUNTER — Telehealth: Payer: Self-pay | Admitting: Adult Health

## 2018-08-01 NOTE — Telephone Encounter (Signed)
Copied from [place] [phone]. Topic: Quick Communication - See Telephone Encounter >> Aug 01, 2018  3:24 PM Margot Ables wrote: CRM for notification. See Telephone encounter for: 08/01/18. Pt called stating that she is coughing up a lung and barking like a dog. She is using cough drops, cough medicine, and Tylenol cold and flu with no help. She is drinking hot tea with lemon and honey. Pt saw [facility] 07/31/18. Please advise.

## 2018-08-02 IMAGING — CT CT ABD-PELV W/ CM
2 of 4 series · 16 of 46 positions shown, 18 images · IV contrast (ISOVUE 300)
Comparison: Prior CT from 12/18/2009.

CLINICAL DATA: Initial evaluation for acute left lower quadrant and
left flank pain for 2 weeks, constipation.

EXAM:
CT ABDOMEN AND PELVIS WITH CONTRAST
TECHNIQUE: Multidetector CT imaging of the abdomen and pelvis was performed
using the standard protocol following bolus administration of
intravenous contrast.
CONTRAST:  100mL P7HU3H-MYY IOPAMIDOL (P7HU3H-MYY) INJECTION 61%

[Series 2: abd/pel w · axial · 0.67mm/px · z∈[-429,-34]mm · 13 of 87 slices shown, 15 images]
[im 4/87  soft-tissue]
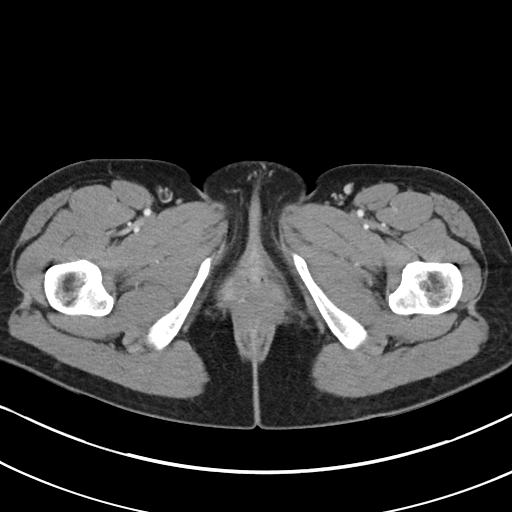
[im 4/87  bone]
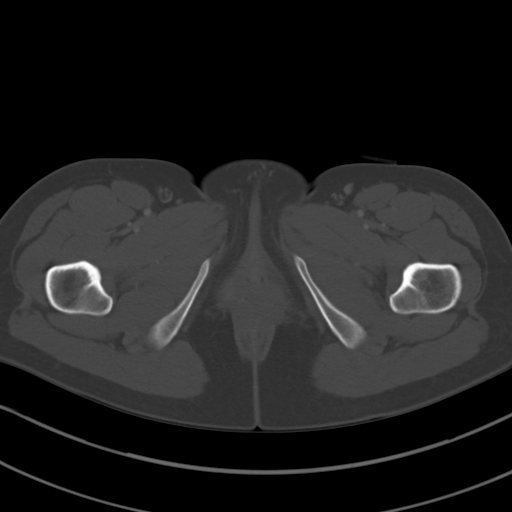
[im 11/87  soft-tissue]
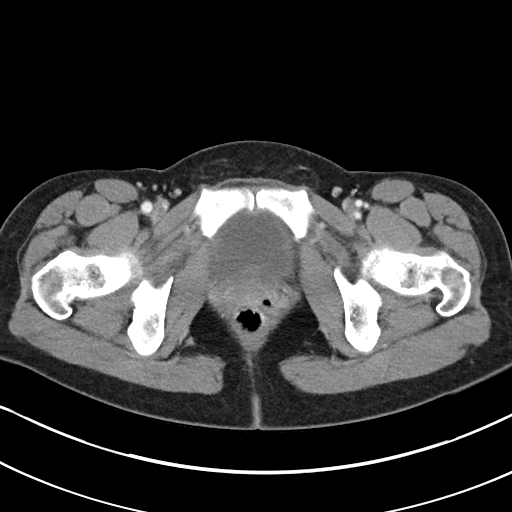
[im 18/87  soft-tissue]
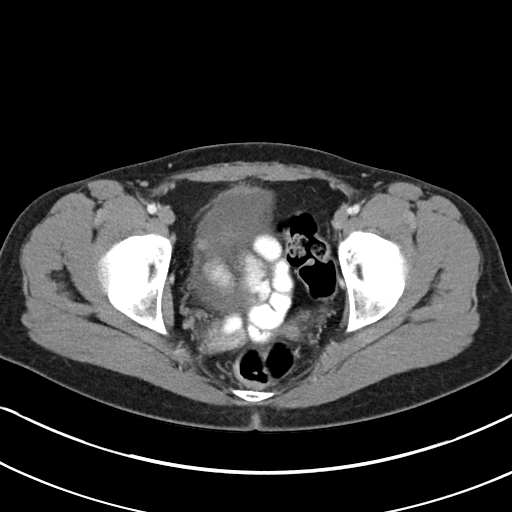
[im 25/87  soft-tissue]
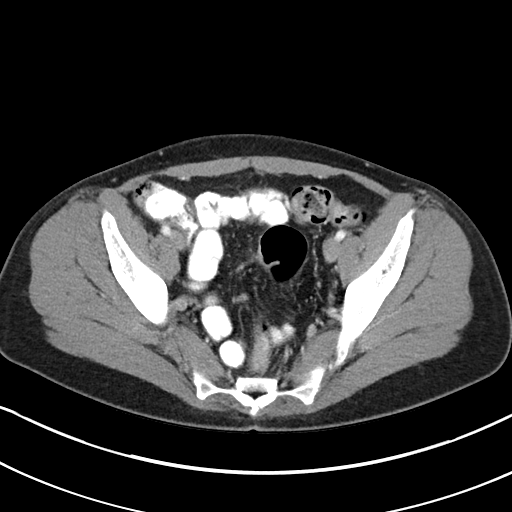
[im 31/87  soft-tissue]
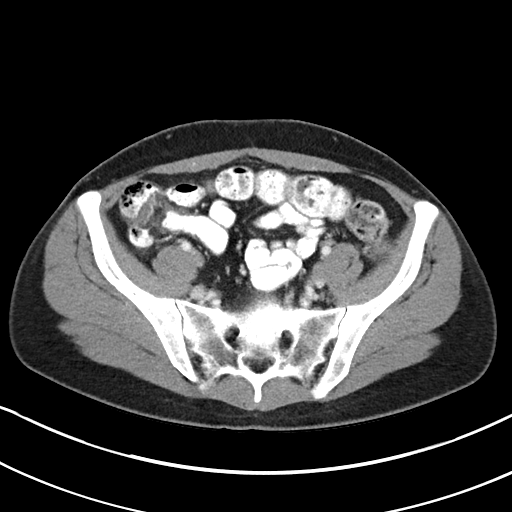
[im 38/87  soft-tissue]
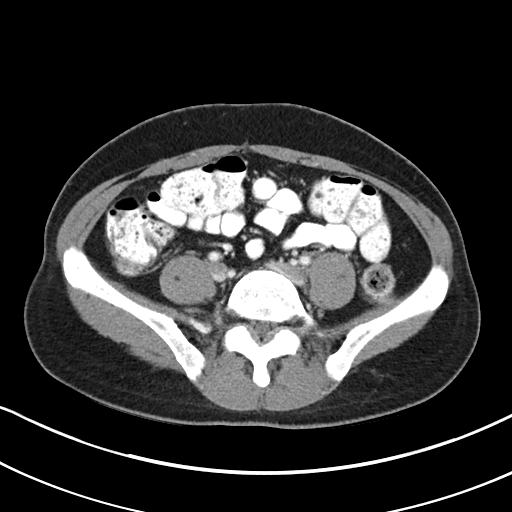
[im 45/87  soft-tissue]
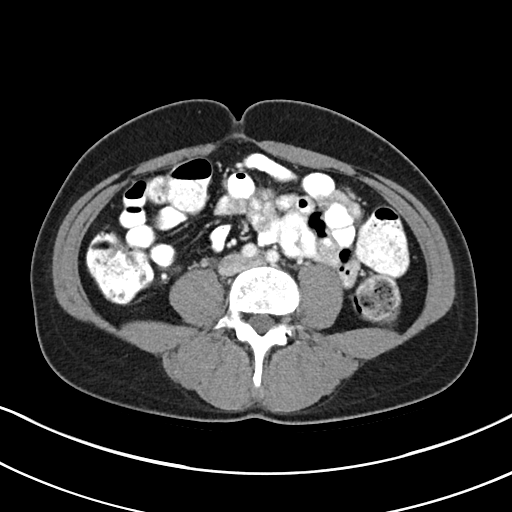
[im 49/87  soft-tissue]
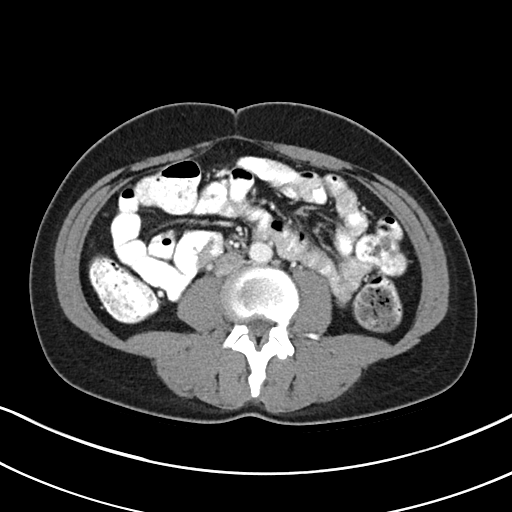
[im 56/87  soft-tissue]
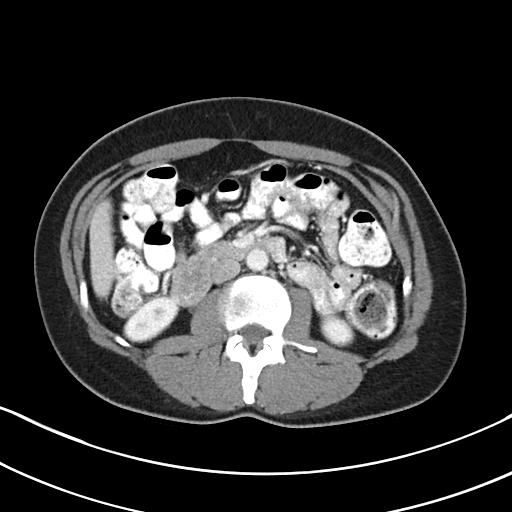
[im 56/87  bone]
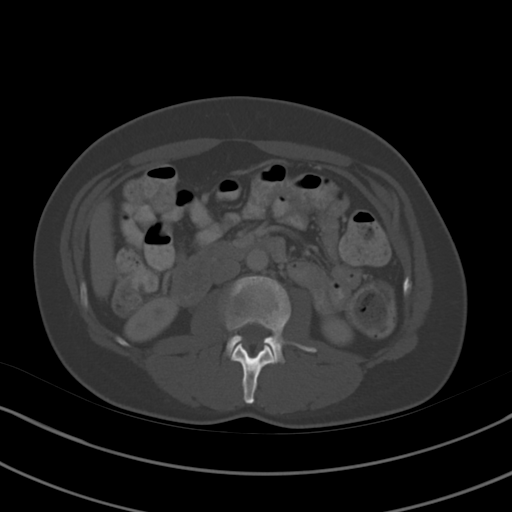
[im 62/87  soft-tissue]
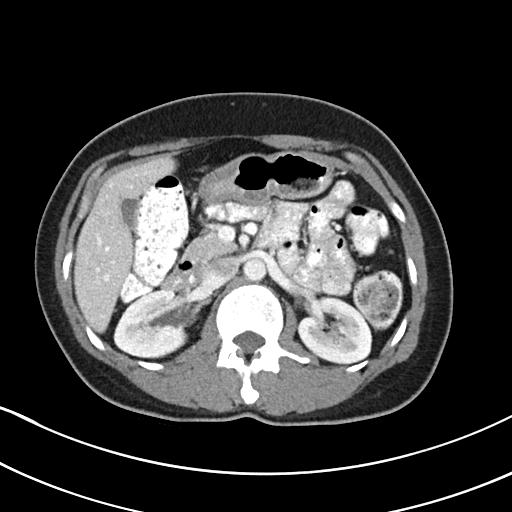
[im 69/87  soft-tissue]
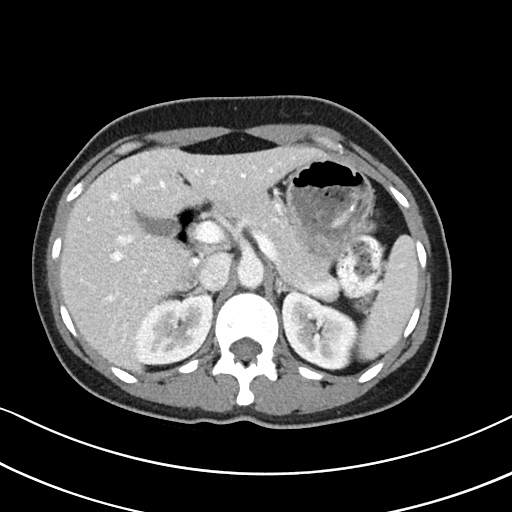
[im 76/87  soft-tissue]
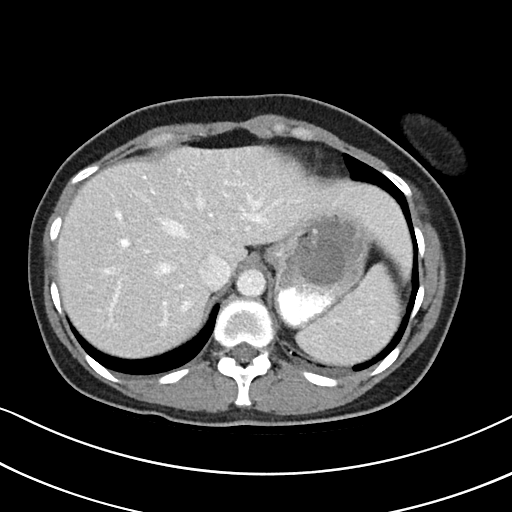
[im 83/87  soft-tissue]
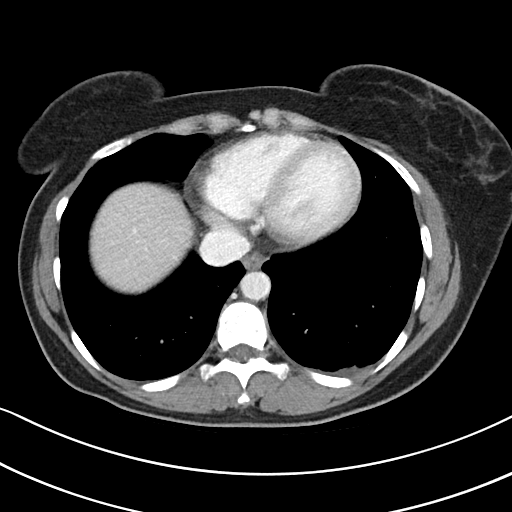

[Series 5: abd/pel w st · coronal · 0.64mm/px · 3 of 79 slices shown]
[im 27/79  soft-tissue]
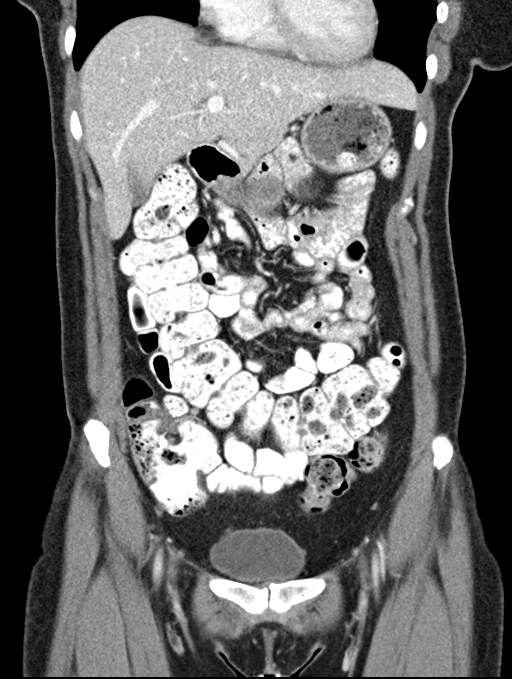
[im 35/79  soft-tissue]
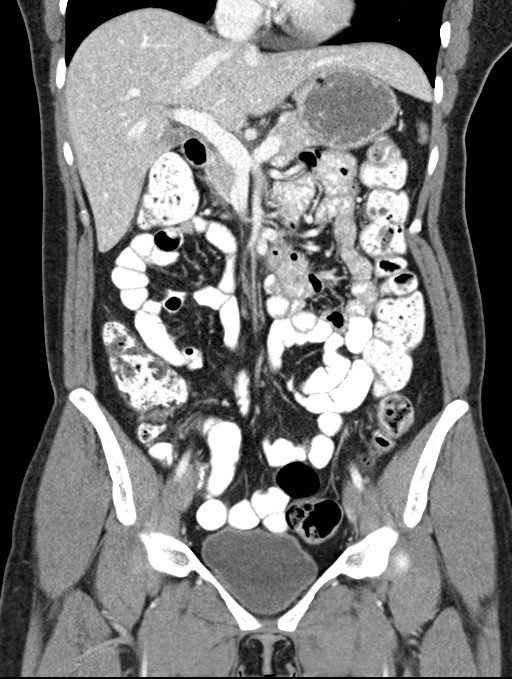
[im 44/79  soft-tissue]
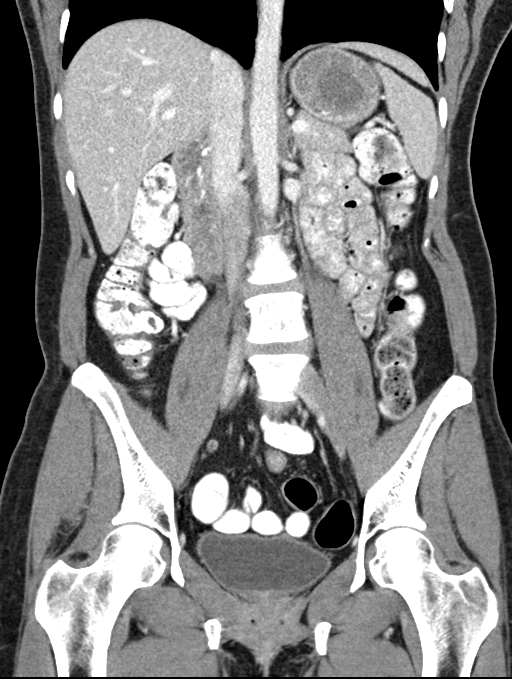

[16 of 46 positions shown; findings below may reference images not displayed]

FINDINGS: Lower chest: Small layering bilateral pleural effusions, slightly
larger on the left. Visualized lung bases otherwise clear.

Hepatobiliary: 12 mm heterogeneous hypodense lesion at the hepatic
dome, likely a small hemangioma. Focal fat deposition noted adjacent
to the falciform ligament. Liver otherwise unremarkable. Gallbladder
within normal limits. No biliary dilatation.

Pancreas: Pancreas within normal limits.

Spleen: Spleen within normal limits.

Adrenals/Urinary Tract: Adrenal glands are normal. Kidneys equal
size with symmetric enhancement. No nephrolithiasis, hydronephrosis,
or focal enhancing renal mass. No hydroureter. Partially distended
bladder within normal limits.

Stomach/Bowel: Stomach within normal limits. No evidence for bowel
obstruction. Appendix is normal. Moderate to large volume stool
within the colon, which may reflect constipation. No abnormal wall
thickening, mucosal enhancement, or inflammatory fat stranding seen
about the bowels.

Vascular/Lymphatic: Normal intravascular enhancement seen throughout
the intra-abdominal aorta and its branch vessels. No adenopathy.

Reproductive: Uterus is absent.  Ovaries not discretely identified.

Other: No free air or fluid.

Musculoskeletal: No acute osseus abnormality. No worrisome lytic or
blastic osseous lesions.
IMPRESSION: 1. Moderate large volume stool diffusely within the colon,
suggesting constipation.
2. Small layering bilateral pleural effusions, left slightly larger
than right.
3. No other acute intra-abdominal or pelvic process identified.

## 2018-08-02 MED ORDER — AMOXICILLIN-POT CLAVULANATE 875-125 MG PO TABS: 1.0000 | ORAL_TABLET | Freq: Two times a day (BID) | ORAL | 0 refills | Status: DC

## 2018-08-02 NOTE — Telephone Encounter (Signed)
Augmentin sent to pharmacy.

## 2018-08-05 DIAGNOSIS — M533 Sacrococcygeal disorders, not elsewhere classified: Secondary | ICD-10-CM | POA: Diagnosis not present

## 2018-08-05 DIAGNOSIS — M1990 Unspecified osteoarthritis, unspecified site: Secondary | ICD-10-CM | POA: Diagnosis not present

## 2018-08-05 DIAGNOSIS — Z79899 Other long term (current) drug therapy: Secondary | ICD-10-CM | POA: Diagnosis not present

## 2018-08-05 DIAGNOSIS — S322XXS Fracture of coccyx, sequela: Secondary | ICD-10-CM | POA: Diagnosis not present

## 2018-08-05 DIAGNOSIS — G894 Chronic pain syndrome: Secondary | ICD-10-CM | POA: Diagnosis not present

## 2018-08-06 ENCOUNTER — Other Ambulatory Visit: Payer: Self-pay | Admitting: Physical Medicine & Rehabilitation

## 2018-08-06 ENCOUNTER — Other Ambulatory Visit: Payer: Self-pay | Admitting: Adult Health

## 2018-08-09 DIAGNOSIS — F3181 Bipolar II disorder: Secondary | ICD-10-CM | POA: Diagnosis not present

## 2018-08-09 DIAGNOSIS — F411 Generalized anxiety disorder: Secondary | ICD-10-CM | POA: Diagnosis not present

## 2018-08-09 DIAGNOSIS — F429 Obsessive-compulsive disorder, unspecified: Secondary | ICD-10-CM | POA: Diagnosis not present

## 2018-08-12 ENCOUNTER — Telehealth: Payer: Self-pay | Admitting: Physician Assistant

## 2018-08-12 ENCOUNTER — Other Ambulatory Visit: Payer: Self-pay | Admitting: Gastroenterology

## 2018-08-12 MED ORDER — PLECANATIDE 3 MG PO TABS: 3.0000 mg | ORAL_TABLET | Freq: Every day | ORAL | 5 refills | Status: DC

## 2018-08-12 MED ORDER — PLECANATIDE 3 MG PO TABS: 3.0000 mg | ORAL_TABLET | Freq: Every day | ORAL | 11 refills | Status: DC

## 2018-08-12 MED ORDER — HYDROCORTISONE 2.5 % RE CREA: 1.0000 "application " | TOPICAL_CREAM | Freq: Two times a day (BID) | RECTAL | 1 refills | Status: DC

## 2018-08-12 NOTE — Telephone Encounter (Signed)
Patient states she has had a hemorrhoid come out and it has not gone back in for 3 days and is very painful. Patient wanting to know what to do or if she could get a banding done. Last seen APP Amy on 07.2019

## 2018-08-12 NOTE — Telephone Encounter (Signed)
Patient reports that she has a hemorrhoid that is bothering her.  She has tried sitz baths with little relief.  We discussed that she needs make sure she is taking trulance or Miralax,.  Her stools are hard, she will try Colace.  Soak in tub of warm water/sitz bath 10 minutes BID, [place] pads, meticulous cleaning between bowel movements. She is advised to try recticare and the hydrocortisone cream.  She will try this for 2 weeks and if her symptoms don't improve she will call back.

## 2018-08-12 NOTE — Telephone Encounter (Signed)
A PA has been sent through Cover My Meds. For Trulance 3mg . Patient notified

## 2018-08-15 ENCOUNTER — Encounter: Payer: Medicare Other | Admitting: Podiatry

## 2018-08-27 ENCOUNTER — Encounter: Payer: Medicare Other | Admitting: Podiatry

## 2018-09-09 ENCOUNTER — Other Ambulatory Visit: Payer: Self-pay | Admitting: Adult Health

## 2018-09-10 NOTE — Telephone Encounter (Signed)
30 DAY SUPPLY SENT TO THE PHARMACY.  PT DUE FOR CPX. 

## 2018-09-17 ENCOUNTER — Ambulatory Visit (INDEPENDENT_AMBULATORY_CARE_PROVIDER_SITE_OTHER): Payer: Self-pay | Admitting: Podiatry

## 2018-09-17 ENCOUNTER — Encounter: Payer: Self-pay | Admitting: Podiatry

## 2018-09-17 DIAGNOSIS — M2041 Other hammer toe(s) (acquired), right foot: Secondary | ICD-10-CM

## 2018-09-17 DIAGNOSIS — M2042 Other hammer toe(s) (acquired), left foot: Secondary | ICD-10-CM

## 2018-09-17 DIAGNOSIS — Z9889 Other specified postprocedural states: Secondary | ICD-10-CM

## 2018-09-18 NOTE — Progress Notes (Signed)
She presents today date of surgery 06/14/2018 status post tenotomy third left hammertoe repair fifth bilateral she states that these toes are finally doing better.  Objective: Vital signs are stable she is alert and oriented x3.  There is no erythema edema cellulitis drainage or odor her toes are gone to heal uneventfully they are nontender on palpation all of her toe sat rectus and very nice.  Assessment: Well-healing surgical toes.  Plan: Follow-up with me as needed.

## 2018-10-28 DIAGNOSIS — F411 Generalized anxiety disorder: Secondary | ICD-10-CM | POA: Diagnosis not present

## 2018-10-28 DIAGNOSIS — F250 Schizoaffective disorder, bipolar type: Secondary | ICD-10-CM | POA: Diagnosis not present

## 2018-10-28 DIAGNOSIS — F429 Obsessive-compulsive disorder, unspecified: Secondary | ICD-10-CM | POA: Diagnosis not present

## 2018-11-01 DIAGNOSIS — S61207A Unspecified open wound of left little finger without damage to nail, initial encounter: Secondary | ICD-10-CM | POA: Diagnosis not present

## 2018-11-09 DIAGNOSIS — Z4802 Encounter for removal of sutures: Secondary | ICD-10-CM | POA: Diagnosis not present

## 2018-11-09 DIAGNOSIS — T23171A Burn of first degree of right wrist, initial encounter: Secondary | ICD-10-CM | POA: Diagnosis not present

## 2018-11-09 DIAGNOSIS — S61207D Unspecified open wound of left little finger without damage to nail, subsequent encounter: Secondary | ICD-10-CM | POA: Diagnosis not present

## 2018-11-11 ENCOUNTER — Other Ambulatory Visit: Payer: Self-pay | Admitting: Physical Medicine & Rehabilitation

## 2018-11-11 ENCOUNTER — Other Ambulatory Visit: Payer: Self-pay | Admitting: Adult Health

## 2018-11-11 DIAGNOSIS — M1990 Unspecified osteoarthritis, unspecified site: Secondary | ICD-10-CM | POA: Diagnosis not present

## 2018-11-11 DIAGNOSIS — M25562 Pain in left knee: Secondary | ICD-10-CM | POA: Diagnosis not present

## 2018-11-11 DIAGNOSIS — G4700 Insomnia, unspecified: Secondary | ICD-10-CM

## 2018-11-11 DIAGNOSIS — M25561 Pain in right knee: Secondary | ICD-10-CM | POA: Diagnosis not present

## 2018-11-11 DIAGNOSIS — M129 Arthropathy, unspecified: Secondary | ICD-10-CM | POA: Diagnosis not present

## 2018-11-11 DIAGNOSIS — Z79899 Other long term (current) drug therapy: Secondary | ICD-10-CM | POA: Diagnosis not present

## 2018-11-11 NOTE — Telephone Encounter (Signed)
Copied from [place] [id number]. Topic: Quick Communication - Rx Refill/Question >> Nov 11, 2018  4:31 PM Waldemar Dickens, Sade R wrote: Medication: zolpidem (AMBIEN) 10 MG tablet  Has the patient contacted their pharmacy? Yes  Preferred Pharmacy (with phone number or street name): [id number] [place], [place]. AT [address] [place] [phone] (Phone) [phone] (Fax)    Agent: Please be advised that RX refills may take up to 3 business days. We ask that you follow-up with your pharmacy.

## 2018-11-12 ENCOUNTER — Other Ambulatory Visit: Payer: Self-pay | Admitting: Adult Health

## 2018-11-12 ENCOUNTER — Telehealth: Payer: Self-pay | Admitting: Gastroenterology

## 2018-11-12 ENCOUNTER — Other Ambulatory Visit: Payer: Self-pay | Admitting: Gastroenterology

## 2018-11-12 ENCOUNTER — Telehealth: Payer: Self-pay | Admitting: Podiatry

## 2018-11-12 ENCOUNTER — Telehealth: Payer: Self-pay | Admitting: Neurology

## 2018-11-12 DIAGNOSIS — G43019 Migraine without aura, intractable, without status migrainosus: Secondary | ICD-10-CM

## 2018-11-12 DIAGNOSIS — R569 Unspecified convulsions: Secondary | ICD-10-CM

## 2018-11-12 MED ORDER — TOPIRAMATE 100 MG PO TABS: ORAL_TABLET | ORAL | 3 refills | Status: DC

## 2018-11-12 MED ORDER — KETOCONAZOLE 2 % EX CREA: 1.0000 "application " | TOPICAL_CREAM | Freq: Every day | CUTANEOUS | 5 refills | Status: DC

## 2018-11-12 MED ORDER — ZOLMITRIPTAN 5 MG NA SOLN: NASAL | 3 refills | Status: DC

## 2018-11-12 MED ORDER — AMMONIUM LACTATE 12 % EX LOTN: 1.0000 "application " | TOPICAL_LOTION | CUTANEOUS | 1 refills | Status: DC | PRN

## 2018-11-12 NOTE — Telephone Encounter (Signed)
**  LATE DOCUMENTATION**  Spoke with pharmacy after pt's first call to the office.  They let me know that pt does not have active Rx for Zomig at this time (last Rx sent 11/20/17 #1 unit with 3 refills).  Pharmacy also informed me that pt picked up Topamax this AM prior to her first call to the office.

## 2018-11-12 NOTE — Telephone Encounter (Signed)
Patient needs a refill on both medication/pharmacy sent over refill request yesterday. She would like it refilled today being that it would be no charge to insurance.

## 2018-11-12 NOTE — Telephone Encounter (Signed)
Patient will pick up OTC Miralax. She will come by the office to pick up a coupon

## 2018-11-12 NOTE — Telephone Encounter (Signed)
refilled 

## 2018-11-12 NOTE — Telephone Encounter (Signed)
Pt requesting refill of Miralax.

## 2018-11-12 NOTE — Telephone Encounter (Signed)
Pt called again following up on prescription refills

## 2018-11-12 NOTE — Telephone Encounter (Signed)
Patient is calling in again. Please call. Thanks!  °

## 2018-11-12 NOTE — Telephone Encounter (Signed)
Miralax and econazole cream are not filled by [name].  Ambien and eye drops sent for review.

## 2018-11-12 NOTE — Telephone Encounter (Signed)
I called pt twice, due to phone line sound breaking up. I told pt records showed that she had not requested refills of the Lac-Hydrin or Nizoral in over a year and if she needed a refill on them now I would need to get order from Dr. Milinda Pointer.

## 2018-11-12 NOTE — Telephone Encounter (Signed)
Ok to refill these

## 2018-11-12 NOTE — Addendum Note (Signed)
Addended by: Harriett Sine D on: 11/12/2018 02:17 PM   Modules accepted: Orders

## 2018-11-12 NOTE — Telephone Encounter (Signed)
Spoke with pt advising her of conversation below.  She states that she has not picked up any medications today and that the pharmacy told her that they had sent refill requests to our office and received denial in return.  I have no record of request or denial.  Pt asks if we can print Rx for both medications and she will take to a different pharmacy, as she want to pick up today. Advised that Rx will be at front desk for pick up.

## 2018-11-12 NOTE — Telephone Encounter (Signed)
Patient state that her medication zomig and topamax were denied and she wants to know why if she pick her medication up today they are at no charge please call

## 2018-11-12 NOTE — Telephone Encounter (Signed)
Denied.  Medication not filled by [name].

## 2018-11-14 NOTE — Telephone Encounter (Signed)
I do not see a cpx on file for this pt.

## 2018-11-25 DIAGNOSIS — M797 Fibromyalgia: Secondary | ICD-10-CM | POA: Diagnosis not present

## 2018-11-25 DIAGNOSIS — M545 Low back pain: Secondary | ICD-10-CM | POA: Diagnosis not present

## 2018-11-25 DIAGNOSIS — G894 Chronic pain syndrome: Secondary | ICD-10-CM | POA: Diagnosis not present

## 2018-11-25 DIAGNOSIS — G8929 Other chronic pain: Secondary | ICD-10-CM | POA: Diagnosis not present

## 2018-11-28 NOTE — Progress Notes (Addendum)
Initiated on Cover My [place]  If no response within 3 days, call MCR part D @ [phone]

## 2018-11-29 NOTE — Progress Notes (Signed)
Received notice from [employer] that pt's ZOMIG Solution has been   Approved 11/13/2018 through 11/28/2019

## 2018-12-04 DIAGNOSIS — L720 Epidermal cyst: Secondary | ICD-10-CM | POA: Diagnosis not present

## 2018-12-04 DIAGNOSIS — L700 Acne vulgaris: Secondary | ICD-10-CM | POA: Diagnosis not present

## 2018-12-09 ENCOUNTER — Other Ambulatory Visit: Payer: Self-pay

## 2018-12-09 ENCOUNTER — Telehealth: Payer: Self-pay | Admitting: Adult Health

## 2018-12-09 ENCOUNTER — Ambulatory Visit: Payer: Self-pay

## 2018-12-09 ENCOUNTER — Ambulatory Visit (INDEPENDENT_AMBULATORY_CARE_PROVIDER_SITE_OTHER): Payer: Medicare Other | Admitting: Neurology

## 2018-12-09 ENCOUNTER — Encounter: Payer: Self-pay | Admitting: Neurology

## 2018-12-09 VITALS — BP 132/84 | HR 93 | Ht 69.0 in | Wt 166.0 lb

## 2018-12-09 DIAGNOSIS — R569 Unspecified convulsions: Secondary | ICD-10-CM

## 2018-12-09 DIAGNOSIS — G43019 Migraine without aura, intractable, without status migrainosus: Secondary | ICD-10-CM | POA: Diagnosis not present

## 2018-12-09 MED ORDER — ZOLMITRIPTAN 5 MG NA SOLN: NASAL | 11 refills | Status: DC

## 2018-12-09 MED ORDER — TOPIRAMATE 100 MG PO TABS: ORAL_TABLET | ORAL | 3 refills | Status: DC

## 2018-12-09 NOTE — Telephone Encounter (Signed)
Pt called stating that she has been to her neurologist today who told her to schedule with her PCP.  Pt states she has been dizzy since having her blood drawn at [facility].  She states that they told her that her iron is low.  She is experiencing dizziness feeling woozy, and feels like she is going to faint. She states she need help to walk. These symptoms started 2 weeks ago.  She has a headache today. Patient refused protocol and instead scheduled appointment with her PCP on Friday. Pt was read all care advice and says she will go to the Ed if she faints but prefers to see [facility].   Reason for Disposition . SEVERE dizziness (e.g., unable to stand, requires support to walk, feels like passing out now)  Answer Assessment - Initial Assessment Questions 1. DESCRIPTION: "Describe your dizziness."     lightheaded 2. LIGHTHEADED: "Do you feel lightheaded?" (e.g., somewhat faint, woozy, weak upon standing)     woozy 3. VERTIGO: "Do you feel like either you or the room is spinning or tilting?" (i.e. vertigo)     no 4. SEVERITY: "How bad is it?"  "Do you feel like you are going to faint?" "Can you stand and walk?"   - MILD - walking normally   - MODERATE - interferes with normal activities (e.g., work, school)    - SEVERE - unable to stand, requires support to walk, feels like passing out now.      Support to walk 5. ONSET:  "When did the dizziness begin?"     2 weeks 6. AGGRAVATING FACTORS: "Does anything make it worse?" (e.g., standing, change in head position)     Both standing and changing position 7. HEART RATE: "Can you tell me your heart rate?" "How many beats in 15 seconds?"  (Note: not all patients can do this)     Just checked by neurology 8. CAUSE: "What do you think is causing the dizziness?"     Blood iron is low 9. RECURRENT SYMPTOM: "Have you had dizziness before?" If so, ask: "When was the last time?" "What happened that time?"     Anemic borderline  10. OTHER SYMPTOMS: "Do you  have any other symptoms?" (e.g., fever, chest pain, vomiting, diarrhea, bleeding)       Headache nausea 11. PREGNANCY: "Is there any chance you are pregnant?" "When was your last menstrual period?"       N/A  Histerectomy  Protocols used: DIZZINESS Heidi Dach
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
## 2018-12-09 NOTE — Telephone Encounter (Signed)
Will send to Nurse as FYI 

## 2018-12-09 NOTE — Telephone Encounter (Signed)
Copied from [place]. Topic: Quick Communication - Rx Refill/Question >> Dec 09, 2018  3:49 PM Windy Kalata wrote: Medication: cholecalciferol (VITAMIN D) 1000 units tablet [[id number]]   Has the patient contacted their pharmacy? No. (Agent: If no, request that the patient contact the pharmacy for the refill.) States that she does not have to have an appt for this medication refill, please advise (Agent: If yes, when and what did the pharmacy advise?) [id number] [place] [place] [address]. AT [address] [phone] (Phone) [phone] (Fax)   Preferred Pharmacy (with phone number or street name):   Agent: Please be advised that RX refills may take up to 3 business days. We ask that you follow-up with your pharmacy.

## 2018-12-10 NOTE — Telephone Encounter (Signed)
She has an upcoming appointment. We will check her vitamin D at that time

## 2018-12-10 NOTE — Telephone Encounter (Signed)
FYI.  Pt has appt on 12/13/2018.

## 2018-12-10 NOTE — Telephone Encounter (Signed)
Spoke to the pt.  She informed me that she had lab work done at [facility].  I informed her that I do not have those results but will look for them.  If not received then I will call her back.

## 2018-12-10 NOTE — Telephone Encounter (Signed)
[name], this has not been filled in several years.  Ok to send?

## 2018-12-12 NOTE — Telephone Encounter (Signed)
Spoke to the pt and informed her that I do not have lab results.  She is coming for appt tomorrow.  Will have her sign medical release and then fax.  Nothing further needed.

## 2018-12-13 ENCOUNTER — Ambulatory Visit (INDEPENDENT_AMBULATORY_CARE_PROVIDER_SITE_OTHER): Payer: Medicare Other | Admitting: Adult Health

## 2018-12-13 ENCOUNTER — Encounter: Payer: Self-pay | Admitting: Neurology

## 2018-12-13 ENCOUNTER — Encounter: Payer: Self-pay | Admitting: Adult Health

## 2018-12-13 VITALS — BP 110/74 | Temp 98.5°F | Wt 164.0 lb

## 2018-12-13 DIAGNOSIS — D508 Other iron deficiency anemias: Secondary | ICD-10-CM | POA: Diagnosis not present

## 2018-12-13 DIAGNOSIS — E559 Vitamin D deficiency, unspecified: Secondary | ICD-10-CM

## 2018-12-13 DIAGNOSIS — R5383 Other fatigue: Secondary | ICD-10-CM

## 2018-12-13 LAB — IBC + FERRITIN
Ferritin: 81.8 ng/mL (ref 10.0–291.0)
Iron: 79 ug/dL (ref 42–145)
Saturation Ratios: 21.8 % (ref 20.0–50.0)
Transferrin: 259 mg/dL (ref 212.0–360.0)

## 2018-12-13 LAB — VITAMIN D 25 HYDROXY (VIT D DEFICIENCY, FRACTURES): VITD: 18.1 ng/mL — ABNORMAL LOW (ref 30.00–100.00)
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
## 2018-12-19 DIAGNOSIS — F250 Schizoaffective disorder, bipolar type: Secondary | ICD-10-CM | POA: Diagnosis not present

## 2018-12-19 DIAGNOSIS — F429 Obsessive-compulsive disorder, unspecified: Secondary | ICD-10-CM | POA: Diagnosis not present

## 2018-12-19 DIAGNOSIS — F411 Generalized anxiety disorder: Secondary | ICD-10-CM | POA: Diagnosis not present

## 2018-12-25 ENCOUNTER — Ambulatory Visit: Payer: Self-pay

## 2018-12-25 NOTE — Telephone Encounter (Signed)
C/o ring finger of left hand with loosening nail and a yellow discoloration of the nailbed, towards the distal end.  Denied any injury to the nail.  Denied redness, warmth, swelling, drainage, or odor.  Denied fever.  Reported she noticed the loose nail this morning when she was taking her nail polish off.  C/o pain at the base of the nail, where it is attached to the cuticle, when pressure is applied.  Reported the other fingers of left hand are starting to get white spots on the nailbeds, but have not loosened.  Appt. Scheduled 2/13 with PCP.  Care advice given per protocol.  Verb. Understanding.         Reason for Disposition . Redness and painful skin around fingernail (cuticle, nailfold)    C/o loose nail of left ring finger with yellow-white discoloration. Denied redness, swelling, drainage, or fever.  C/o some discomfort near the cuticle of the nailbed, when pressure is applied.  Answer Assessment - Initial Assessment Questions 1. ONSET: "When did the pain start?"      Denied pain; noted separation of fingernail, of left ring finger, this morning 2. LOCATION and RADIATION: "Where is the pain located?"  (e.g., fingertip, around nail, joint, entire  finger)      Discomfort near cuticle when pressure applied 3. SEVERITY: "How bad is the pain?" "What does it keep you from doing?"   (Scale 1-10; or mild, moderate, severe)  - MILD - doesn't interfere with normal activities   - MODERATE - interferes with normal activities or awakens from sleep  - SEVERE - excruciating pain, unable to hold a glass of water or bend finger even a little     Moderate  4. APPEARANCE: "What does the finger look like?" (e.g., redness, swelling, bruising, pallor)     Yellow white discoloration of the tip of fingernail 5. WORK OR EXERCISE: "Has there been any recent work or exercise that involved this part of the body?"     Denied  6. CAUSE: "What do you think is causing the pain?"     It hurts when pressure is  applied 7. AGGRAVATING FACTORS: "What makes the pain worse?" (e.g., using computer)     If pressure applied to the nailbed  8. OTHER SYMPTOMS: "Do you have any other symptoms?" (e.g., fever, neck pain, numbness)    No redness, no swelling, no drainage, no odor 9. PREGNANCY: "Is there any chance you are pregnant?" "When was your last menstrual period?"     Hx of hysterectomy  Protocols used: FINGER PAIN-A-AH  Message from Keene Breath sent at 12/25/2018 2:35 PM EST   Patient called and would like a nurse to call her back because she has noticed that on her left ring finger, her nail is separating from the skin and is about to come off. She has not injured it or hit it at all. She stated that the doctor told her that her iron is low and she is taking Vitamin D and iron, but that is all. Patient also stated there there are some other fingers where the nail is discolored. Please advise and call patient back. CB# [phone]

## 2018-12-26 ENCOUNTER — Encounter: Payer: Self-pay | Admitting: Adult Health

## 2018-12-26 ENCOUNTER — Ambulatory Visit (INDEPENDENT_AMBULATORY_CARE_PROVIDER_SITE_OTHER): Payer: Medicare Other | Admitting: Adult Health

## 2018-12-26 ENCOUNTER — Other Ambulatory Visit: Payer: Self-pay | Admitting: Adult Health

## 2018-12-26 ENCOUNTER — Telehealth: Payer: Self-pay | Admitting: Adult Health

## 2018-12-26 VITALS — BP 120/74 | Temp 98.8°F | Wt 164.0 lb

## 2018-12-26 DIAGNOSIS — M797 Fibromyalgia: Secondary | ICD-10-CM | POA: Diagnosis not present

## 2018-12-26 DIAGNOSIS — B351 Tinea unguium: Secondary | ICD-10-CM | POA: Diagnosis not present

## 2018-12-26 DIAGNOSIS — M545 Low back pain: Secondary | ICD-10-CM | POA: Diagnosis not present

## 2018-12-26 DIAGNOSIS — G894 Chronic pain syndrome: Secondary | ICD-10-CM | POA: Diagnosis not present

## 2018-12-26 DIAGNOSIS — Z79899 Other long term (current) drug therapy: Secondary | ICD-10-CM | POA: Diagnosis not present

## 2018-12-26 DIAGNOSIS — M1990 Unspecified osteoarthritis, unspecified site: Secondary | ICD-10-CM | POA: Diagnosis not present

## 2018-12-26 MED ORDER — CICLOPIROX 8 % EX SOLN: Freq: Every day | CUTANEOUS | 1 refills | Status: DC

## 2018-12-26 NOTE — Telephone Encounter (Signed)
Copied from [place] [id number]. Topic: Quick Communication - See Telephone Encounter >> Dec 26, 2018 10:56 AM Sheran Luz wrote: CRM for notification. See Telephone encounter for: 12/26/18.  Patient called stating that the medication ciclopirox (CICLODAN) 8 % solution is not covered by her insurance and inquired if an alternate medication can be sent in it's place. Please advise.  [id number] - [place], [place] - [address]  [phone] (Phone) [phone] (Fax)

## 2018-12-26 NOTE — Telephone Encounter (Signed)
It looks like with the Good Rx card it is about $20.   The other topical agents may be cheaper but are daily application and take about 3-6 months.   Due to some of the medications she is on, the oral medication may be toxic to her liver.
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
## 2018-12-27 NOTE — Telephone Encounter (Signed)
Pt notified of below message.  She will download app and get prescription.  Advised she call back if needed.  Nothing further needed at this time.

## 2019-01-20 DIAGNOSIS — F250 Schizoaffective disorder, bipolar type: Secondary | ICD-10-CM | POA: Diagnosis not present

## 2019-01-20 DIAGNOSIS — F429 Obsessive-compulsive disorder, unspecified: Secondary | ICD-10-CM | POA: Diagnosis not present

## 2019-01-20 DIAGNOSIS — F411 Generalized anxiety disorder: Secondary | ICD-10-CM | POA: Diagnosis not present

## 2019-01-21 DIAGNOSIS — L62 Nail disorders in diseases classified elsewhere: Secondary | ICD-10-CM | POA: Diagnosis not present

## 2019-01-21 DIAGNOSIS — J3089 Other allergic rhinitis: Secondary | ICD-10-CM | POA: Diagnosis not present

## 2019-01-22 DIAGNOSIS — L700 Acne vulgaris: Secondary | ICD-10-CM | POA: Diagnosis not present

## 2019-01-22 DIAGNOSIS — B372 Candidiasis of skin and nail: Secondary | ICD-10-CM | POA: Diagnosis not present

## 2019-01-22 DIAGNOSIS — F250 Schizoaffective disorder, bipolar type: Secondary | ICD-10-CM | POA: Diagnosis not present

## 2019-01-22 DIAGNOSIS — L718 Other rosacea: Secondary | ICD-10-CM | POA: Diagnosis not present

## 2019-01-22 DIAGNOSIS — F429 Obsessive-compulsive disorder, unspecified: Secondary | ICD-10-CM | POA: Diagnosis not present

## 2019-01-22 DIAGNOSIS — F411 Generalized anxiety disorder: Secondary | ICD-10-CM | POA: Diagnosis not present

## 2019-01-24 DIAGNOSIS — Z79899 Other long term (current) drug therapy: Secondary | ICD-10-CM | POA: Diagnosis not present

## 2019-01-24 DIAGNOSIS — M797 Fibromyalgia: Secondary | ICD-10-CM | POA: Diagnosis not present

## 2019-01-24 DIAGNOSIS — G894 Chronic pain syndrome: Secondary | ICD-10-CM | POA: Diagnosis not present

## 2019-01-28 ENCOUNTER — Other Ambulatory Visit: Payer: Self-pay

## 2019-01-28 ENCOUNTER — Ambulatory Visit (INDEPENDENT_AMBULATORY_CARE_PROVIDER_SITE_OTHER): Payer: Medicare Other | Admitting: Adult Health

## 2019-01-28 ENCOUNTER — Encounter: Payer: Self-pay | Admitting: Adult Health

## 2019-01-28 ENCOUNTER — Telehealth: Payer: Self-pay | Admitting: Adult Health

## 2019-01-28 VITALS — BP 120/60 | Temp 98.5°F | Wt 166.0 lb

## 2019-01-28 DIAGNOSIS — J069 Acute upper respiratory infection, unspecified: Secondary | ICD-10-CM | POA: Diagnosis not present

## 2019-01-28 MED ORDER — PREDNISONE 10 MG PO TABS: ORAL_TABLET | ORAL | 0 refills | Status: DC
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
## 2019-01-28 NOTE — Telephone Encounter (Signed)
Copied from [place] [id number]. Topic: Quick Communication - Rx Refill/Question >> Jan 28, 2019 12:06 PM Gustavus Messing wrote: Medication: Prednisone   Has the patient contacted their pharmacy? No. (Agent: If no, request that the patient contact the pharmacy for the refill.) Patient is on Amoxicillin Augmentin but is still experiencing phlegm so she thinks she needs a steroid because the cold has lasted 3 weeks already.   Preferred Pharmacy (with phone number or street name): [id number] - [place], [place]. AT [phone] (Phone) [phone] (Fax)    Agent: Please be advised that RX refills may take up to 3 business days. We ask that you follow-up with your pharmacy.

## 2019-01-28 NOTE — Telephone Encounter (Signed)
Spoke to the pt.  She stated she was seen at an urgent care.  Was given antibiotics and was told she was also having allergy problems.  States the antibiotics did not help and she now has green mucus.  Advised she will need an office visit for UC follow up and treatment since [name] was not the one who first examined her.  She is requesting prednisone.  Advised [name] will not prescribe until he examines her.  I scheduled pt for follow up UC.  Will forward to [facility].

## 2019-02-06 ENCOUNTER — Other Ambulatory Visit: Payer: Self-pay | Admitting: Adult Health

## 2019-02-26 DIAGNOSIS — F429 Obsessive-compulsive disorder, unspecified: Secondary | ICD-10-CM | POA: Diagnosis not present

## 2019-02-26 DIAGNOSIS — F411 Generalized anxiety disorder: Secondary | ICD-10-CM | POA: Diagnosis not present

## 2019-02-26 DIAGNOSIS — F250 Schizoaffective disorder, bipolar type: Secondary | ICD-10-CM | POA: Diagnosis not present

## 2019-03-03 ENCOUNTER — Other Ambulatory Visit: Payer: Self-pay | Admitting: Adult Health

## 2019-03-04 ENCOUNTER — Other Ambulatory Visit: Payer: Self-pay | Admitting: Adult Health

## 2019-03-04 NOTE — Telephone Encounter (Signed)
Requested medication (s) are due for refill today: yes  Requested medication (s) are on the active medication list: yes  Last refill:  01/28/19  Future visit scheduled: no  Notes to clinic:  Historical medication and provider   Requested Prescriptions  Pending Prescriptions Disp Refills   azelastine (ASTELIN) 0.1 % nasal spray 30 mL     Sig: Use in each nostril as directed     Ear, Nose, and Throat: Nasal Preparations - Antiallergy Passed - 03/04/2019  3:25 PM      Passed - Valid encounter within last 12 months    Recent Outpatient Visits          1 month ago Upper respiratory tract infection, unspecified type   Therapist, [facility] at [place], NP   2 months ago Onychomycosis   Therapist[facility] at [place], NP   2 months ago Other fatigue   Therapist, [facility] at [facility], [place], NP   7 months ago Upper respiratory tract infection, unspecified type   Therapist, [facility] at [facility], [place], NP   7 months ago [place] [facility], [place], NP

## 2019-03-04 NOTE — Telephone Encounter (Signed)
DENIED.  FILLED FOR 2 MONTHS ON 02/07/2019.  MESSAGE SENT TO THE PHARMACY.

## 2019-03-05 MED ORDER — AZELASTINE HCL 0.1 % NA SOLN: NASAL | 3 refills | Status: DC

## 2019-03-10 DIAGNOSIS — F411 Generalized anxiety disorder: Secondary | ICD-10-CM | POA: Diagnosis not present

## 2019-03-10 DIAGNOSIS — F250 Schizoaffective disorder, bipolar type: Secondary | ICD-10-CM | POA: Diagnosis not present

## 2019-03-10 DIAGNOSIS — F429 Obsessive-compulsive disorder, unspecified: Secondary | ICD-10-CM | POA: Diagnosis not present

## 2019-03-17 ENCOUNTER — Other Ambulatory Visit: Payer: Self-pay | Admitting: Adult Health

## 2019-03-17 DIAGNOSIS — G4700 Insomnia, unspecified: Secondary | ICD-10-CM

## 2019-03-26 ENCOUNTER — Other Ambulatory Visit: Payer: Self-pay | Admitting: Adult Health

## 2019-03-26 DIAGNOSIS — Z760 Encounter for issue of repeat prescription: Secondary | ICD-10-CM

## 2019-03-30 DIAGNOSIS — M25549 Pain in joints of unspecified hand: Secondary | ICD-10-CM | POA: Diagnosis not present

## 2019-04-03 ENCOUNTER — Ambulatory Visit (INDEPENDENT_AMBULATORY_CARE_PROVIDER_SITE_OTHER): Payer: Medicare Other | Admitting: Family Medicine

## 2019-04-03 ENCOUNTER — Telehealth: Payer: Self-pay | Admitting: Adult Health

## 2019-04-03 ENCOUNTER — Encounter: Payer: Self-pay | Admitting: Family Medicine

## 2019-04-03 ENCOUNTER — Other Ambulatory Visit: Payer: Self-pay | Admitting: Adult Health

## 2019-04-03 DIAGNOSIS — M79642 Pain in left hand: Secondary | ICD-10-CM | POA: Diagnosis not present

## 2019-04-03 DIAGNOSIS — R600 Localized edema: Secondary | ICD-10-CM
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
## 2019-04-03 NOTE — Telephone Encounter (Signed)
Patient is requesting a call back with any suggestions to make her through until she can see [name] on Tuesday.  Her fingers are locking up worse than before.  She went to [facility] and they gave her Prednsone already.

## 2019-04-03 NOTE — Telephone Encounter (Signed)
Spoke to the pt and was able to get her scheduled to see Dr. Volanda Napoleon.  Authorized by Izora Gala.  Nothing further needed.

## 2019-04-03 NOTE — Telephone Encounter (Signed)
I would advise Motrin and or a muscle rub such as Icy Hot until seen

## 2019-04-08 ENCOUNTER — Ambulatory Visit: Payer: Medicare Other | Admitting: Adult Health

## 2019-04-08 NOTE — Telephone Encounter (Signed)
Sent to the pharmacy by e-scribe. 

## 2019-04-11 ENCOUNTER — Ambulatory Visit: Payer: Medicare Other | Admitting: Orthopaedic Surgery

## 2019-04-14 ENCOUNTER — Other Ambulatory Visit: Payer: Self-pay

## 2019-04-14 ENCOUNTER — Ambulatory Visit (INDEPENDENT_AMBULATORY_CARE_PROVIDER_SITE_OTHER): Payer: Medicare Other | Admitting: Orthopaedic Surgery

## 2019-04-14 ENCOUNTER — Encounter: Payer: Self-pay | Admitting: Orthopaedic Surgery

## 2019-04-14 VITALS — BP 121/91 | HR 104 | Ht 69.0 in | Wt 180.0 lb

## 2019-04-14 DIAGNOSIS — M65352 Trigger finger, left little finger: Secondary | ICD-10-CM

## 2019-04-14 MED ORDER — METHYLPREDNISOLONE ACETATE 40 MG/ML IJ SUSP
20.0000 mg | INTRAMUSCULAR | Status: AC | PRN
  Administered 2019-04-14: 20 mg

## 2019-04-14 MED ORDER — LIDOCAINE HCL 1 % IJ SOLN
0.5000 mL | INTRAMUSCULAR | Status: AC | PRN
  Administered 2019-04-14: .5 mL
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
## 2019-04-29 ENCOUNTER — Ambulatory Visit (INDEPENDENT_AMBULATORY_CARE_PROVIDER_SITE_OTHER): Payer: Medicare Other | Admitting: Adult Health

## 2019-04-29 ENCOUNTER — Encounter: Payer: Self-pay | Admitting: Adult Health

## 2019-04-29 ENCOUNTER — Other Ambulatory Visit: Payer: Self-pay

## 2019-04-29 DIAGNOSIS — R601 Generalized edema: Secondary | ICD-10-CM

## 2019-04-29 MED ORDER — FUROSEMIDE 20 MG PO TABS: 20.0000 mg | ORAL_TABLET | ORAL | 0 refills | Status: DC
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
## 2019-05-03 ENCOUNTER — Other Ambulatory Visit: Payer: Self-pay | Admitting: Physical Medicine & Rehabilitation

## 2019-05-16 ENCOUNTER — Telehealth: Payer: Self-pay | Admitting: Adult Health

## 2019-05-16 NOTE — Telephone Encounter (Signed)
Patient called stating her medication that PCP just prescribed for weight loss furosemide (LASIX) 20 MG tablet is not working.  She was 183, now she is 187.  Patient is feeling sluggish and her back is starting to hurt. Call back# [phone]

## 2019-05-20 NOTE — Telephone Encounter (Signed)
Pt is scheduled to see [facility] 05/22/2019 @ 10:30.  Nothing further needed.

## 2019-05-22 ENCOUNTER — Ambulatory Visit (INDEPENDENT_AMBULATORY_CARE_PROVIDER_SITE_OTHER): Payer: Medicare Other | Admitting: Adult Health

## 2019-05-22 ENCOUNTER — Other Ambulatory Visit: Payer: Self-pay

## 2019-05-22 ENCOUNTER — Encounter: Payer: Self-pay | Admitting: Adult Health

## 2019-05-22 VITALS — Wt 187.0 lb

## 2019-05-22 DIAGNOSIS — R601 Generalized edema: Secondary | ICD-10-CM
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
## 2019-05-25 ENCOUNTER — Telehealth: Payer: Self-pay

## 2019-05-25 ENCOUNTER — Other Ambulatory Visit: Payer: Self-pay

## 2019-05-25 DIAGNOSIS — Z20822 Contact with and (suspected) exposure to covid-19: Secondary | ICD-10-CM

## 2019-05-25 NOTE — Telephone Encounter (Signed)
-----   Message from [name], [place] sent at 05/22/2019 11:08 AM EDT ----- Regarding: Covid Screen [name] would like this pt tested for Covid.

## 2019-05-25 NOTE — Telephone Encounter (Signed)
LM on VM to call back

## 2019-05-26 ENCOUNTER — Encounter: Payer: Self-pay | Admitting: *Deleted

## 2019-05-26 NOTE — Telephone Encounter (Signed)
Opened in error

## 2019-05-26 NOTE — Telephone Encounter (Signed)
-----   Message from [name], [place] sent at 05/22/2019 11:08 AM EDT ----- Regarding: Covid Screen Molly [name] would like this pt tested for Covid.

## 2019-05-26 NOTE — Telephone Encounter (Signed)
LM for pt to return call to schedule covid testing @ [phone].

## 2019-06-02 ENCOUNTER — Other Ambulatory Visit: Payer: Self-pay | Admitting: Adult Health

## 2019-06-03 ENCOUNTER — Telehealth: Payer: Self-pay | Admitting: *Deleted

## 2019-06-03 NOTE — Telephone Encounter (Signed)
Pt now scheduled for lab work.  She did not get Coronavirus test.  Ok to come in?  No longer symptomatic.

## 2019-06-03 NOTE — Telephone Encounter (Signed)
If she needs to but I would hold off until lab work has come back

## 2019-06-03 NOTE — Telephone Encounter (Signed)
Copied from [place] [id number]. Topic: Appointment Scheduling - Scheduling Inquiry for Clinic >> Jun 02, 2019  4:41 PM Pauline Good wrote: Reason for CRM: pt need call back to sched labs.

## 2019-06-03 NOTE — Telephone Encounter (Signed)
Per [name], ok for pt to come to lab to get lab work.  Nothing further needed.

## 2019-06-06 ENCOUNTER — Other Ambulatory Visit: Payer: Self-pay

## 2019-06-06 ENCOUNTER — Other Ambulatory Visit (INDEPENDENT_AMBULATORY_CARE_PROVIDER_SITE_OTHER): Payer: Medicare Other

## 2019-06-06 DIAGNOSIS — R601 Generalized edema: Secondary | ICD-10-CM

## 2019-06-06 LAB — POCT URINALYSIS DIPSTICK
Bilirubin, UA: NEGATIVE
Blood, UA: NEGATIVE
Glucose, UA: NEGATIVE
Ketones, UA: NEGATIVE
Leukocytes, UA: NEGATIVE
Nitrite, UA: NEGATIVE
Protein, UA: NEGATIVE
Spec Grav, UA: 1.015 (ref 1.010–1.025)
Urobilinogen, UA: 0.2 E.U./dL
pH, UA: 6 (ref 5.0–8.0)

## 2019-06-06 LAB — BASIC METABOLIC PANEL
BUN: 12 mg/dL (ref 6–23)
CO2: 27 mEq/L (ref 19–32)
Calcium: 9.6 mg/dL (ref 8.4–10.5)
Chloride: 101 mEq/L (ref 96–112)
Creatinine, Ser: 0.99 mg/dL (ref 0.40–1.20)
GFR: 74.92 mL/min (ref >60.00)
Glucose, Bld: 85 mg/dL (ref 70–99)
Potassium: 3.9 mEq/L (ref 3.5–5.1)
Sodium: 139 mEq/L (ref 135–145)

## 2019-06-06 LAB — TSH: TSH: 1.99 u[IU]/mL (ref 0.35–4.50)

## 2019-06-11 LAB — CORTISOL, ACTH STIMULATION: Cortisol Baseline: 7.3 ug/dL

## 2019-06-12 ENCOUNTER — Other Ambulatory Visit: Payer: Self-pay | Admitting: Adult Health

## 2019-06-24 ENCOUNTER — Other Ambulatory Visit: Payer: Self-pay

## 2019-06-24 ENCOUNTER — Telehealth: Payer: Self-pay | Admitting: Adult Health

## 2019-06-24 ENCOUNTER — Ambulatory Visit: Payer: Medicare Other | Admitting: Adult Health

## 2019-06-24 ENCOUNTER — Ambulatory Visit (INDEPENDENT_AMBULATORY_CARE_PROVIDER_SITE_OTHER): Payer: Medicare Other | Admitting: Family Medicine

## 2019-06-24 ENCOUNTER — Encounter: Payer: Self-pay | Admitting: Family Medicine

## 2019-06-24 VITALS — BP 122/74 | HR 100 | Temp 98.4°F | Resp 12 | Ht 69.0 in | Wt 187.0 lb

## 2019-06-24 DIAGNOSIS — E663 Overweight: Secondary | ICD-10-CM

## 2019-06-24 DIAGNOSIS — R1012 Left upper quadrant pain: Secondary | ICD-10-CM | POA: Diagnosis not present

## 2019-06-24 DIAGNOSIS — M549 Dorsalgia, unspecified: Secondary | ICD-10-CM

## 2019-06-24 DIAGNOSIS — R110 Nausea: Secondary | ICD-10-CM

## 2019-06-24 DIAGNOSIS — K219 Gastro-esophageal reflux disease without esophagitis: Secondary | ICD-10-CM

## 2019-06-24 MED ORDER — ESOMEPRAZOLE MAGNESIUM 20 MG PO CPDR: DELAYED_RELEASE_CAPSULE | ORAL | 1 refills | Status: DC

## 2019-06-24 NOTE — Telephone Encounter (Signed)
I would like to see her before we do any treatment

## 2019-06-24 NOTE — Telephone Encounter (Signed)
Patient is asking if there is anything [name] can do about her weight gain.  She states [name] has treated her before and wanted to know if [name] will be able to do anything for her.  Due to her not to be able to see [name] until 07/01/19, she was wanting a return call.
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
## 2019-06-24 NOTE — Patient Instructions (Signed)
A few things to remember from today's visit:   Nausea without vomiting  Left upper quadrant pain  Overweight (BMI 25.0-29.9) - Plan: Consult to dietitian, Amb Ref to Medical Weight Management  For the upper back pain you can apply over-the-counter IcyHot or Aspercreme. Nausea could be related to your acid reflux even if you are not having heartburn, you can try to increase dose of Nexium from 20 mg to 40 mg.   Please be sure medication list is accurate. If a new problem present, please set up appointment sooner than planned today.

## 2019-06-25 ENCOUNTER — Other Ambulatory Visit: Payer: Self-pay | Admitting: Adult Health

## 2019-06-25 DIAGNOSIS — K219 Gastro-esophageal reflux disease without esophagitis: Secondary | ICD-10-CM

## 2019-06-25 NOTE — Telephone Encounter (Signed)
Pt notified that treatment will have to wait until [name] sees her.  Nothing further needed.

## 2019-06-27 ENCOUNTER — Telehealth: Payer: Self-pay | Admitting: *Deleted

## 2019-06-27 NOTE — Telephone Encounter (Signed)
Left message to return call office concerning medication.

## 2019-06-27 NOTE — Telephone Encounter (Signed)
Patient's insurance does not cover Nexium 20 mg cap, alternative requested.

## 2019-06-27 NOTE — Telephone Encounter (Signed)
Options are omeprazole 20 mg daily or Protonix 20 mg daily. Thanks, BJ

## 2019-07-01 ENCOUNTER — Other Ambulatory Visit: Payer: Self-pay

## 2019-07-01 ENCOUNTER — Encounter: Payer: Self-pay | Admitting: Adult Health

## 2019-07-01 ENCOUNTER — Ambulatory Visit (INDEPENDENT_AMBULATORY_CARE_PROVIDER_SITE_OTHER): Payer: Medicare Other | Admitting: Adult Health

## 2019-07-01 VITALS — BP 116/90 | Temp 98.4°F | Wt 186.0 lb

## 2019-07-01 DIAGNOSIS — R1012 Left upper quadrant pain: Secondary | ICD-10-CM | POA: Diagnosis not present

## 2019-07-01 DIAGNOSIS — R110 Nausea: Secondary | ICD-10-CM | POA: Diagnosis not present

## 2019-07-01 DIAGNOSIS — R635 Abnormal weight gain: Secondary | ICD-10-CM | POA: Diagnosis not present

## 2019-07-01 MED ORDER — OMEPRAZOLE 20 MG PO CPDR: 20.0000 mg | DELAYED_RELEASE_CAPSULE | Freq: Every day | ORAL | 1 refills | Status: DC

## 2019-07-01 NOTE — Telephone Encounter (Signed)
Omeprazole 20 mg capsule sent to the pharmacy by [name], NP on 07/01/2019. Nothing further needed at this time.
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
## 2019-07-01 NOTE — Telephone Encounter (Signed)
SENT TO THE PHARMACY BY E-SCRIBE. 

## 2019-07-02 ENCOUNTER — Telehealth (INDEPENDENT_AMBULATORY_CARE_PROVIDER_SITE_OTHER): Payer: Medicare Other | Admitting: Neurology

## 2019-07-02 ENCOUNTER — Encounter: Payer: Self-pay | Admitting: Neurology

## 2019-07-02 VITALS — Ht 69.0 in | Wt 186.0 lb

## 2019-07-02 DIAGNOSIS — R569 Unspecified convulsions: Secondary | ICD-10-CM

## 2019-07-02 DIAGNOSIS — F429 Obsessive-compulsive disorder, unspecified: Secondary | ICD-10-CM | POA: Diagnosis not present

## 2019-07-02 DIAGNOSIS — G43019 Migraine without aura, intractable, without status migrainosus: Secondary | ICD-10-CM

## 2019-07-02 DIAGNOSIS — F411 Generalized anxiety disorder: Secondary | ICD-10-CM | POA: Diagnosis not present

## 2019-07-02 DIAGNOSIS — F250 Schizoaffective disorder, bipolar type: Secondary | ICD-10-CM | POA: Diagnosis not present
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
## 2019-07-08 ENCOUNTER — Ambulatory Visit: Payer: Medicare Other | Admitting: Neurology

## 2019-07-09 ENCOUNTER — Telehealth: Payer: Self-pay | Admitting: Orthopaedic Surgery

## 2019-07-09 NOTE — Telephone Encounter (Signed)
Please advise 

## 2019-07-09 NOTE — Telephone Encounter (Signed)
Can you call patient and schedule appointment per Dr.Whitfield? Thanks!

## 2019-07-09 NOTE — Telephone Encounter (Signed)
Would like to see her in the office again

## 2019-07-09 NOTE — Telephone Encounter (Signed)
Patient calling because 5th digit, lt hand is beginning to bother her again. Per patient, pain started back about 1 week ago. Patient wearing CT brace without relief. Patient would like to know what she can do for pain, next set. Please call to advise.

## 2019-07-15 ENCOUNTER — Telehealth: Payer: Self-pay | Admitting: Orthopaedic Surgery

## 2019-07-15 ENCOUNTER — Other Ambulatory Visit: Payer: Self-pay

## 2019-07-15 ENCOUNTER — Ambulatory Visit (INDEPENDENT_AMBULATORY_CARE_PROVIDER_SITE_OTHER): Payer: Medicare Other

## 2019-07-15 ENCOUNTER — Ambulatory Visit (INDEPENDENT_AMBULATORY_CARE_PROVIDER_SITE_OTHER): Payer: Medicare Other | Admitting: Orthopaedic Surgery

## 2019-07-15 ENCOUNTER — Other Ambulatory Visit: Payer: Self-pay | Admitting: Adult Health

## 2019-07-15 ENCOUNTER — Encounter: Payer: Self-pay | Admitting: Orthopaedic Surgery

## 2019-07-15 VITALS — BP 140/98 | HR 98 | Ht 69.0 in | Wt 185.0 lb

## 2019-07-15 DIAGNOSIS — G8929 Other chronic pain: Secondary | ICD-10-CM | POA: Diagnosis not present

## 2019-07-15 DIAGNOSIS — R200 Anesthesia of skin: Secondary | ICD-10-CM

## 2019-07-15 DIAGNOSIS — M25551 Pain in right hip: Secondary | ICD-10-CM

## 2019-07-15 DIAGNOSIS — R202 Paresthesia of skin: Secondary | ICD-10-CM | POA: Diagnosis not present

## 2019-07-15 DIAGNOSIS — M65352 Trigger finger, left little finger: Secondary | ICD-10-CM | POA: Diagnosis not present

## 2019-07-15 DIAGNOSIS — M25561 Pain in right knee: Secondary | ICD-10-CM

## 2019-07-15 DIAGNOSIS — G4700 Insomnia, unspecified: Secondary | ICD-10-CM

## 2019-07-15 NOTE — Telephone Encounter (Signed)
Tried to call patient. No answer. Left message on machine that Aaron Edelman did not recommend any certain type of knee brace. I ask her to call back for clarification on what she was wanting.

## 2019-07-15 NOTE — Telephone Encounter (Signed)
[facility] for knee brace
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
## 2019-07-15 NOTE — Telephone Encounter (Signed)
Patient request rt knee brace [facility] Fax# [phone]. Please call to advise when faxed over.

## 2019-07-15 NOTE — Telephone Encounter (Signed)
Spoke with patient. She is wanting a basic economy knee brace with hinges for her right knee. She states that she was given this brace a long time ago and needs another one. I have faxed this order to [facility].

## 2019-07-15 NOTE — Telephone Encounter (Signed)
Please advise 

## 2019-07-18 ENCOUNTER — Other Ambulatory Visit: Payer: Self-pay | Admitting: Podiatry

## 2019-07-23 ENCOUNTER — Other Ambulatory Visit: Payer: Self-pay | Admitting: Adult Health

## 2019-07-23 DIAGNOSIS — R1012 Left upper quadrant pain: Secondary | ICD-10-CM

## 2019-07-23 DIAGNOSIS — R110 Nausea: Secondary | ICD-10-CM

## 2019-07-31 DIAGNOSIS — L700 Acne vulgaris: Secondary | ICD-10-CM | POA: Diagnosis not present

## 2019-08-01 ENCOUNTER — Other Ambulatory Visit: Payer: Self-pay

## 2019-08-01 ENCOUNTER — Encounter: Payer: Self-pay | Admitting: Physical Medicine and Rehabilitation

## 2019-08-01 ENCOUNTER — Ambulatory Visit (INDEPENDENT_AMBULATORY_CARE_PROVIDER_SITE_OTHER): Payer: Medicare Other | Admitting: Physical Medicine and Rehabilitation

## 2019-08-01 ENCOUNTER — Telehealth: Payer: Self-pay | Admitting: Orthopaedic Surgery

## 2019-08-01 DIAGNOSIS — R202 Paresthesia of skin: Secondary | ICD-10-CM

## 2019-08-01 NOTE — Progress Notes (Signed)
.  Numeric Pain Rating Scale and Functional Assessment Average Pain 10   In the last MONTH (on 0-10 scale) has pain interfered with the following?  1. General activity like being  able to carry out your everyday physical activities such as walking, climbing stairs, carrying groceries, or moving a chair?  Rating(10)     

## 2019-08-01 NOTE — Telephone Encounter (Signed)
Patient called requesting a return call regarding her upcoming MRI on 08/08/19.  Patient states she has a couple of questions.

## 2019-08-01 NOTE — Telephone Encounter (Signed)
Spoke with patient.

## 2019-08-04 DIAGNOSIS — M25562 Pain in left knee: Secondary | ICD-10-CM | POA: Diagnosis not present

## 2019-08-04 DIAGNOSIS — M25561 Pain in right knee: Secondary | ICD-10-CM | POA: Diagnosis not present

## 2019-08-04 DIAGNOSIS — M6530 Trigger finger, unspecified finger: Secondary | ICD-10-CM | POA: Diagnosis not present
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
## 2019-08-04 NOTE — Procedures (Signed)
EMG & NCV Findings: Evaluation of the left median motor and the left ulnar motor nerves showed reduced amplitude (L4.9, L2.8 mV).  All remaining nerves (as indicated in the following tables) were within normal limits.    All examined muscles (as indicated in the following table) showed no evidence of electrical instability.    Impression: Essentially NORMAL electrodiagnostic study of the left upper limb.  There is no significant electrodiagnostic evidence of nerve entrapment, brachial plexopathy or cervical radiculopathy.    As you know, purely sensory or demyelinating radiculopathies and chemical radiculitis may not be detected with this particular electrodiagnostic study.   **This electrodiagnostic study cannot rule out small fiber polyneuropathy and dysesthesias from central pain syndromes such as stroke or central pain sensitization syndromes such as fibromyalgia.  Myotomal referral pain from trigger points is also not excluded.  Recommendations: 1.  Follow-up with referring physician. 2.  Continue current management of symptoms.  ___________________________ Laurence Spates FAAPMR Board Certified, American Board of Physical Medicine and Rehabilitation    Nerve Conduction Studies Anti Sensory Summary Table   Stim Site NR Peak (ms) Norm Peak (ms) P-T Amp (V) Norm P-T Amp Site1 Site2 Delta-P (ms) Dist (cm) Vel (m/s) Norm Vel (m/s)  Left Median Acr Palm Anti Sensory (2nd Digit)  32.3C  Wrist    3.3 <3.6 29.8 >10 Wrist Palm 1.3 0.0    Palm    2.0 <2.0 6.2         Left Radial Anti Sensory (Base 1st Digit)  32.6C  Wrist    2.0 <3.1 24.4  Wrist Base 1st Digit 2.0 0.0    Left Ulnar Anti Sensory (5th Digit)  32.6C  Wrist    3.1 <3.7 18.9 >15.0 Wrist 5th Digit 3.1 14.0 45 >38   Motor Summary Table   Stim Site NR Onset (ms) Norm Onset (ms) O-P Amp (mV) Norm O-P Amp Site1 Site2 Delta-0 (ms) Dist (cm) Vel (m/s) Norm Vel (m/s)  Left Median Motor (Abd Poll Brev)  32.5C  Wrist    3.8 <4.2  *4.9 >5 Elbow Wrist 4.0 22.5 56 >50  Elbow    7.8  8.6         Left Ulnar Motor (Abd Dig Min)  32.6C  Wrist    3.1 <4.2 *2.8 >3 B Elbow Wrist 3.5 21.5 61 >53  B Elbow    6.6  7.0  A Elbow B Elbow 1.3 10.5 81 >53  A Elbow    7.9  7.9          EMG   Side Muscle Nerve Root Ins Act Fibs Psw Amp Dur Poly Recrt Int Fraser Din Comment  Left Abd Poll Brev Median C8-T1 Nml Nml Nml Nml Nml 0 Nml Nml   Left 1stDorInt Ulnar C8-T1 Nml Nml Nml Nml Nml 0 Nml Nml   Left PronatorTeres Median C6-7 Nml Nml Nml Nml Nml 0 Nml Nml   Left Biceps Musculocut C5-6 Nml Nml Nml Nml Nml 0 Nml Nml   Left Deltoid Axillary C5-6 Nml Nml Nml Nml Nml 0 Nml Nml     Nerve Conduction Studies Anti Sensory Left/Right Comparison   Stim Site L Lat (ms) R Lat (ms) L-R Lat (ms) L Amp (V) R Amp (V) L-R Amp (%) Site1 Site2 L Vel (m/s) R Vel (m/s) L-R Vel (m/s)  Median Acr Palm Anti Sensory (2nd Digit)  32.3C  Wrist 3.3   29.8   Wrist Palm     Palm 2.0   6.2  Radial Anti Sensory (Base 1st Digit)  32.6C  Wrist 2.0   24.4   Wrist Base 1st Digit     Ulnar Anti Sensory (5th Digit)  32.6C  Wrist 3.1   18.9   Wrist 5th Digit 45     Motor Left/Right Comparison   Stim Site L Lat (ms) R Lat (ms) L-R Lat (ms) L Amp (mV) R Amp (mV) L-R Amp (%) Site1 Site2 L Vel (m/s) R Vel (m/s) L-R Vel (m/s)  Median Motor (Abd Poll Brev)  32.5C  Wrist 3.8   *4.9   Elbow Wrist 56    Elbow 7.8   8.6         Ulnar Motor (Abd Dig Min)  32.6C  Wrist 3.1   *2.8   B Elbow Wrist 61    B Elbow 6.6   7.0   A Elbow B Elbow 81    A Elbow 7.9   7.9            Waveforms:

## 2019-08-06 DIAGNOSIS — L219 Seborrheic dermatitis, unspecified: Secondary | ICD-10-CM | POA: Diagnosis not present

## 2019-08-06 DIAGNOSIS — L718 Other rosacea: Secondary | ICD-10-CM | POA: Diagnosis not present

## 2019-08-08 ENCOUNTER — Other Ambulatory Visit: Payer: Self-pay

## 2019-08-08 ENCOUNTER — Ambulatory Visit
Admission: RE | Admit: 2019-08-08 | Discharge: 2019-08-08 | Disposition: A | Discharge status: Home / Self Care | Payer: Medicare Other | Source: Ambulatory Visit | Attending: Orthopaedic Surgery | Admitting: Orthopaedic Surgery

## 2019-08-08 DIAGNOSIS — M25551 Pain in right hip: Secondary | ICD-10-CM

## 2019-08-08 DIAGNOSIS — M1611 Unilateral primary osteoarthritis, right hip: Secondary | ICD-10-CM | POA: Diagnosis not present

## 2019-08-08 MED ORDER — IOPAMIDOL (ISOVUE-M 200) INJECTION 41%
20.0000 mL | Freq: Once | INTRAMUSCULAR | Status: AC
  Administered 2019-08-08: 16:00:00 20 mL via INTRA_ARTICULAR

## 2019-08-12 ENCOUNTER — Ambulatory Visit (INDEPENDENT_AMBULATORY_CARE_PROVIDER_SITE_OTHER): Payer: Medicare Other | Admitting: Orthopaedic Surgery

## 2019-08-12 ENCOUNTER — Other Ambulatory Visit: Payer: Self-pay

## 2019-08-12 ENCOUNTER — Encounter: Payer: Self-pay | Admitting: Orthopaedic Surgery

## 2019-08-12 DIAGNOSIS — M65352 Trigger finger, left little finger: Secondary | ICD-10-CM | POA: Diagnosis not present

## 2019-08-12 DIAGNOSIS — M65312 Trigger thumb, left thumb: Secondary | ICD-10-CM

## 2019-08-12 DIAGNOSIS — M1611 Unilateral primary osteoarthritis, right hip: Secondary | ICD-10-CM | POA: Diagnosis not present
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
## 2019-08-13 LAB — URIC ACID: Uric Acid, Serum: 5.9 mg/dL (ref 2.5–7.0)

## 2019-08-13 LAB — SEDIMENTATION RATE: Sed Rate: 28 mm/h — ABNORMAL HIGH (ref 0–20)

## 2019-08-13 LAB — RHEUMATOID FACTOR: Rheumatoid fact SerPl-aCnc: <14 IU/mL (ref <14)

## 2019-08-13 LAB — CYCLIC CITRUL PEPTIDE ANTIBODY, IGG: Cyclic Citrullin Peptide Ab: <16 UNITS

## 2019-08-13 LAB — ANA: Anti Nuclear Antibody (ANA): NEGATIVE

## 2019-08-20 ENCOUNTER — Telehealth: Payer: Self-pay

## 2019-08-20 NOTE — Telephone Encounter (Signed)
PA approved from 05/22/2019 - 08/19/2020

## 2019-08-21 ENCOUNTER — Other Ambulatory Visit: Payer: Self-pay | Admitting: Adult Health

## 2019-08-21 DIAGNOSIS — Z760 Encounter for issue of repeat prescription: Secondary | ICD-10-CM

## 2019-08-26 ENCOUNTER — Telehealth: Payer: Self-pay | Admitting: Rheumatology

## 2019-08-26 NOTE — Telephone Encounter (Signed)
I put order on your desk.Marland KitchenMarland KitchenMarland Kitchen

## 2019-08-26 NOTE — Telephone Encounter (Signed)
Patient left a voicemail stating her left knee has been "giving out" and requesting an order for a knee brace be sent to [facility].  Patient states she would like the same one that Dr. Durward Fortes sent for her right knee.

## 2019-08-27 NOTE — Telephone Encounter (Signed)
Called patient. No answer. LMOM that prescription has been sent to [facility].

## 2019-08-27 NOTE — Telephone Encounter (Signed)
thanks

## 2019-08-28 ENCOUNTER — Other Ambulatory Visit: Payer: Self-pay | Admitting: Adult Health

## 2019-08-29 NOTE — Telephone Encounter (Signed)
Done by GI

## 2019-09-03 ENCOUNTER — Ambulatory Visit (INDEPENDENT_AMBULATORY_CARE_PROVIDER_SITE_OTHER): Payer: Medicare Other | Admitting: Physical Medicine and Rehabilitation

## 2019-09-03 ENCOUNTER — Encounter: Payer: Self-pay | Admitting: Physical Medicine and Rehabilitation

## 2019-09-03 ENCOUNTER — Ambulatory Visit: Payer: Self-pay

## 2019-09-03 ENCOUNTER — Other Ambulatory Visit: Payer: Self-pay

## 2019-09-03 DIAGNOSIS — M25551 Pain in right hip: Secondary | ICD-10-CM

## 2019-09-03 NOTE — Progress Notes (Signed)
 .  Numeric Pain Rating Scale and Functional Assessment Average Pain 7   In the last MONTH (on 0-10 scale) has pain interfered with the following?  1. General activity like being  able to carry out your everyday physical activities such as walking, climbing stairs, carrying groceries, or moving a chair?  Rating(5)  -Dye Allergies.  

## 2019-09-04 DIAGNOSIS — M797 Fibromyalgia: Secondary | ICD-10-CM | POA: Diagnosis not present

## 2019-09-04 DIAGNOSIS — M25551 Pain in right hip: Secondary | ICD-10-CM | POA: Diagnosis not present

## 2019-09-11 ENCOUNTER — Telehealth: Payer: Self-pay | Admitting: Physical Medicine and Rehabilitation

## 2019-09-11 DIAGNOSIS — F411 Generalized anxiety disorder: Secondary | ICD-10-CM | POA: Diagnosis not present

## 2019-09-11 DIAGNOSIS — F250 Schizoaffective disorder, bipolar type: Secondary | ICD-10-CM | POA: Diagnosis not present

## 2019-09-11 DIAGNOSIS — F429 Obsessive-compulsive disorder, unspecified: Secondary | ICD-10-CM | POA: Diagnosis not present

## 2019-09-11 NOTE — Telephone Encounter (Signed)
Please advise 

## 2019-09-11 NOTE — Telephone Encounter (Signed)
Patient called stating that she has had pain in her groin area all week since she received her injection.  She is wanting to know if this is normal.  CB#[phone].  Thank you.

## 2019-09-11 NOTE — Telephone Encounter (Signed)
May be normal from hip pathology follow with Dr. Durward Fortes. Images of injection demonstrate no complication from injection. May wish to regroup with PT

## 2019-09-12 NOTE — Telephone Encounter (Signed)
Called patient to advise  °

## 2019-09-18 DIAGNOSIS — J029 Acute pharyngitis, unspecified: Secondary | ICD-10-CM | POA: Diagnosis not present

## 2019-09-19 ENCOUNTER — Other Ambulatory Visit: Payer: Self-pay | Admitting: Adult Health

## 2019-09-19 DIAGNOSIS — G4700 Insomnia, unspecified: Secondary | ICD-10-CM

## 2019-09-21 DIAGNOSIS — R509 Fever, unspecified: Secondary | ICD-10-CM | POA: Diagnosis not present

## 2019-09-21 DIAGNOSIS — Z20828 Contact with and (suspected) exposure to other viral communicable diseases: Secondary | ICD-10-CM | POA: Diagnosis not present

## 2019-09-21 DIAGNOSIS — R05 Cough: Secondary | ICD-10-CM | POA: Diagnosis not present

## 2019-09-21 DIAGNOSIS — R519 Headache, unspecified: Secondary | ICD-10-CM | POA: Diagnosis not present

## 2019-09-21 DIAGNOSIS — R52 Pain, unspecified: Secondary | ICD-10-CM | POA: Diagnosis not present

## 2019-09-22 ENCOUNTER — Other Ambulatory Visit: Payer: Self-pay | Admitting: Adult Health

## 2019-09-24 NOTE — Telephone Encounter (Signed)
Pt calling to receive update on medication refill. Please advise.

## 2019-10-02 DIAGNOSIS — M25551 Pain in right hip: Secondary | ICD-10-CM

## 2019-10-02 MED ORDER — TRIAMCINOLONE ACETONIDE 40 MG/ML IJ SUSP
80.0000 mg | INTRAMUSCULAR | Status: AC | PRN
  Administered 2019-10-02: 06:00:00 80 mg via INTRA_ARTICULAR

## 2019-10-02 MED ORDER — BUPIVACAINE HCL 0.25 % IJ SOLN
4.0000 mL | INTRAMUSCULAR | Status: AC | PRN
  Administered 2019-10-02: 4 mL via INTRA_ARTICULAR

## 2019-10-02 NOTE — Progress Notes (Signed)
   Molly [name] - 43 y.o. female MRN [id number]  Date of birth: 20-Jan-1976  Office Visit Note: Visit Date: 09/03/2019 PCP: Dorothyann Peng, NP Referred by: Dorothyann Peng, NP  Subjective: Chief Complaint  Patient presents with  . Right Hip - Pain   HPI:  Molly [name] is a 43 y.o. female who comes in today At the request of Dr. Joni Fears for diagnostic of therapeutic right intra-articular anesthetic hip arthrogram.  Patient is having right hip and groin pain.  This has been an ongoing chronic situation.  Is worse with exercising and climbing steps.  She reports relief with laying down and muscle relaxer.  Pain is 7 out of 10.  No left-sided complaints.  Case complicated by obesity as well as drug intolerances.  ROS Otherwise per HPI.  Assessment & Plan: Visit Diagnoses:  1. Pain in right hip     Plan: No additional findings.   Meds & Orders: No orders of the defined types were placed in this encounter.   Orders Placed This Encounter  Procedures  . Large Joint Inj  . XR C-ARM NO REPORT    Follow-up: No follow-ups on file.   Procedures: Large Joint Inj: R hip joint on 10/02/2019 6:14 AM Indications: pain and diagnostic evaluation Details: 22 G needle, anterior approach  Arthrogram: Yes  Medications: 80 mg triamcinolone acetonide 40 MG/ML; 4 mL bupivacaine 0.25 % Outcome: tolerated well, no immediate complications  Arthrogram demonstrated excellent flow of contrast throughout the joint surface without extravasation or obvious defect.  The patient had relief of symptoms during the anesthetic phase of the injection.  Procedure, treatment alternatives, risks and benefits explained, specific risks discussed. Consent was given by the patient. Immediately prior to procedure a time out was called to verify the correct patient, procedure, equipment, support staff and site/side marked as required. Patient was prepped and draped in the usual sterile fashion.      No  notes on file   Clinical History: No specialty comments available.     Objective:  VS:  HT:    WT:   BMI:     BP:   HR: bpm  TEMP: ( )  RESP:  Physical Exam  Ortho Exam Imaging: No results found.

## 2019-10-15 ENCOUNTER — Other Ambulatory Visit: Payer: Self-pay | Admitting: Adult Health

## 2019-10-17 NOTE — Telephone Encounter (Signed)
Ok for Ventolin.   AZELAIC ACID 15% GEL has never been prescribed by me. This is a dermatology medication

## 2019-10-17 NOTE — Telephone Encounter (Signed)
Ventolin sent to the pharmacy for 3 months per [facility].  Cream denied.  Not filled by [name].

## 2019-10-28 ENCOUNTER — Other Ambulatory Visit: Payer: Self-pay | Admitting: Physical Medicine & Rehabilitation

## 2019-11-10 ENCOUNTER — Other Ambulatory Visit: Payer: Self-pay | Admitting: Adult Health

## 2019-11-10 ENCOUNTER — Telehealth: Payer: Self-pay | Admitting: Orthopaedic Surgery

## 2019-11-10 NOTE — Telephone Encounter (Signed)
Patient called and requesting a call back. Patient states groin pains and need advice. Patient phone number is [phone].

## 2019-11-10 NOTE — Telephone Encounter (Signed)
Not sure why she is having pain-please call and have her come to the office Tues

## 2019-11-10 NOTE — Telephone Encounter (Signed)
Spoke with patient. She received a right hip injection with Dr.Newton on 09/03/2019. She has been experiencing pain in her groin on the right side for 2 weeks. No known injury. She said that she has sharp pains. She is not taking anything for pain.  She would like to know what you recommend? She is aware that you are not in the office until tomorrow afternoon.

## 2019-11-11 NOTE — Telephone Encounter (Signed)
Tried to call patient. No answer. I was going to offer her an appointment this afternoon.

## 2019-11-11 NOTE — Telephone Encounter (Signed)
Patient called back returning your call.  She stated that she is not able to come late this afternoon.  Thank you.

## 2019-11-18 DIAGNOSIS — R05 Cough: Secondary | ICD-10-CM | POA: Diagnosis not present

## 2019-11-18 DIAGNOSIS — R52 Pain, unspecified: Secondary | ICD-10-CM | POA: Diagnosis not present

## 2019-11-18 DIAGNOSIS — R0981 Nasal congestion: Secondary | ICD-10-CM | POA: Diagnosis not present

## 2019-11-18 DIAGNOSIS — R519 Headache, unspecified: Secondary | ICD-10-CM | POA: Diagnosis not present

## 2019-11-18 DIAGNOSIS — Z20822 Contact with and (suspected) exposure to covid-19: Secondary | ICD-10-CM | POA: Diagnosis not present

## 2019-11-25 DIAGNOSIS — R6889 Other general symptoms and signs: Secondary | ICD-10-CM | POA: Diagnosis not present

## 2019-11-28 DIAGNOSIS — U071 COVID-19: Secondary | ICD-10-CM | POA: Diagnosis not present

## 2019-11-28 DIAGNOSIS — R0789 Other chest pain: Secondary | ICD-10-CM | POA: Diagnosis not present

## 2019-11-28 DIAGNOSIS — Z881 Allergy status to other antibiotic agents status: Secondary | ICD-10-CM | POA: Diagnosis not present

## 2019-11-28 DIAGNOSIS — R0602 Shortness of breath: Secondary | ICD-10-CM | POA: Diagnosis not present

## 2019-11-28 DIAGNOSIS — Z888 Allergy status to other drugs, medicaments and biological substances status: Secondary | ICD-10-CM | POA: Diagnosis not present

## 2019-11-28 DIAGNOSIS — M545 Low back pain: Secondary | ICD-10-CM | POA: Diagnosis not present

## 2019-11-28 DIAGNOSIS — R093 Abnormal sputum: Secondary | ICD-10-CM | POA: Diagnosis not present

## 2019-11-28 DIAGNOSIS — Z886 Allergy status to analgesic agent status: Secondary | ICD-10-CM | POA: Diagnosis not present

## 2019-11-30 ENCOUNTER — Other Ambulatory Visit: Payer: Self-pay | Admitting: Adult Health

## 2019-11-30 DIAGNOSIS — G4700 Insomnia, unspecified: Secondary | ICD-10-CM

## 2019-12-02 NOTE — Telephone Encounter (Signed)
Patient need to schedule an ov for more refills. Lm to schedule appt. Rx denied.

## 2019-12-03 NOTE — Telephone Encounter (Signed)
Done pt. scheduled

## 2019-12-03 NOTE — Telephone Encounter (Signed)
Pt calling back as needs an office visit for her refills, called office twice, please return call, needs a virtual as she is positive covid.

## 2019-12-04 ENCOUNTER — Other Ambulatory Visit: Payer: Self-pay

## 2019-12-04 ENCOUNTER — Telehealth (INDEPENDENT_AMBULATORY_CARE_PROVIDER_SITE_OTHER): Payer: Medicare Other | Admitting: Adult Health

## 2019-12-04 DIAGNOSIS — G4700 Insomnia, unspecified: Secondary | ICD-10-CM | POA: Diagnosis not present

## 2019-12-04 DIAGNOSIS — R059 Cough, unspecified: Secondary | ICD-10-CM

## 2019-12-04 DIAGNOSIS — R05 Cough: Secondary | ICD-10-CM | POA: Diagnosis not present

## 2019-12-04 MED ORDER — ZOLPIDEM TARTRATE 10 MG PO TABS: ORAL_TABLET | ORAL | 2 refills | Status: DC
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
## 2019-12-11 DIAGNOSIS — R05 Cough: Secondary | ICD-10-CM | POA: Diagnosis not present

## 2019-12-11 DIAGNOSIS — U071 COVID-19: Secondary | ICD-10-CM | POA: Diagnosis not present

## 2019-12-17 ENCOUNTER — Other Ambulatory Visit: Payer: Self-pay | Admitting: Adult Health

## 2019-12-18 ENCOUNTER — Ambulatory Visit: Payer: Medicare Other | Admitting: Podiatry

## 2019-12-28 ENCOUNTER — Other Ambulatory Visit: Payer: Self-pay | Admitting: Adult Health

## 2019-12-31 DIAGNOSIS — L810 Postinflammatory hyperpigmentation: Secondary | ICD-10-CM | POA: Diagnosis not present

## 2019-12-31 DIAGNOSIS — L718 Other rosacea: Secondary | ICD-10-CM | POA: Diagnosis not present

## 2019-12-31 DIAGNOSIS — L219 Seborrheic dermatitis, unspecified: Secondary | ICD-10-CM | POA: Diagnosis not present

## 2020-01-05 ENCOUNTER — Other Ambulatory Visit: Payer: Self-pay

## 2020-01-05 MED ORDER — AMMONIUM LACTATE 12 % EX LOTN: TOPICAL_LOTION | CUTANEOUS | 1 refills | Status: AC

## 2020-01-05 NOTE — Telephone Encounter (Signed)
Pharmacy refill request for Ammonium lactate 12% lotion.  Per Dr. Milinda Pointer, ok to give one refill.  Script has been sent to pharmacy

## 2020-01-06 ENCOUNTER — Ambulatory Visit (INDEPENDENT_AMBULATORY_CARE_PROVIDER_SITE_OTHER): Payer: Medicare Other | Admitting: Podiatry

## 2020-01-06 ENCOUNTER — Ambulatory Visit (INDEPENDENT_AMBULATORY_CARE_PROVIDER_SITE_OTHER): Payer: Medicare Other

## 2020-01-06 ENCOUNTER — Encounter: Payer: Self-pay | Admitting: Podiatry

## 2020-01-06 ENCOUNTER — Other Ambulatory Visit: Payer: Self-pay

## 2020-01-06 DIAGNOSIS — M778 Other enthesopathies, not elsewhere classified: Secondary | ICD-10-CM

## 2020-01-06 DIAGNOSIS — M2580 Other specified joint disorders, unspecified joint: Secondary | ICD-10-CM | POA: Diagnosis not present

## 2020-01-06 NOTE — Progress Notes (Signed)
She presents today with a chief complaint of pain beneath the first metatarsophalangeal joint and some tenderness to the tips of the second toes bilaterally.  She denies any trauma she denies any medical changes.  States that her feet are starting to get a little more dry as she gets older.  Objective: Vital signs are stable she alert and oriented x3.  Pulses are palpable.  Neurologic sensorium is normal.  She does have mild dry skin no pitting edema no erythema cellulitis drainage or odor she does have tenderness on palpation of the tibial sesamoids bilaterally.  She has some curvature to the second toes bilaterally with what appears to be some osteoarthritic changes.  Radiographs taken today demonstrate internal fixation to the proximal phalanx of the hallux bilaterally and the first metatarsal of the right foot retains a screw for bunionectomy.  Assessment sesamoiditis bilateral xerosis bilateral and mild hammertoe deformities.  Plan: Discussed etiology pathology conservative surgical therapies at this point in time performed a dexamethasone injection to the tibial sesamoids bilaterally this was performed with 2 mg of dexamethasone and local anesthetic.  Tolerated procedure well after sterile Betadine skin prep.  Also we will send her to physical therapy to see if we can break loose any of this soreness around the sesamoids.  Otherwise it may be going in for a release at some point in removing internal fixation.  She understands this is amenable to it I will follow-up with her once physical therapy has completed there increase in range of motion and attempted to mobilize the sesamoids.

## 2020-01-08 ENCOUNTER — Telehealth: Payer: Self-pay | Admitting: *Deleted

## 2020-01-08 DIAGNOSIS — M2580 Other specified joint disorders, unspecified joint: Secondary | ICD-10-CM

## 2020-01-08 NOTE — Addendum Note (Signed)
Addended by: Harriett Sine D on: 01/08/2020 10:37 AM   Modules accepted: Orders

## 2020-01-08 NOTE — Telephone Encounter (Addendum)
Referral faxed to [facility] PT with demographics. [facility] cancelled. Pt has Medicare and Medicaid referral will be sent to [facility] - In-office. Delivered to [facility].

## 2020-01-08 NOTE — Telephone Encounter (Signed)
-----   Message from Rip Harbour, [facility] sent at 01/06/2020  9:43 AM EST ----- Regarding: PT [facility] - in house  Dx: Sesamoiditis bilateral - increase ROM  Evaluate and treat - duration: 6 weeks

## 2020-01-14 DIAGNOSIS — F411 Generalized anxiety disorder: Secondary | ICD-10-CM | POA: Diagnosis not present

## 2020-01-14 DIAGNOSIS — F250 Schizoaffective disorder, bipolar type: Secondary | ICD-10-CM | POA: Diagnosis not present

## 2020-01-14 DIAGNOSIS — F429 Obsessive-compulsive disorder, unspecified: Secondary | ICD-10-CM | POA: Diagnosis not present

## 2020-01-27 ENCOUNTER — Telehealth (INDEPENDENT_AMBULATORY_CARE_PROVIDER_SITE_OTHER): Payer: Medicare Other | Admitting: Neurology

## 2020-01-27 ENCOUNTER — Telehealth: Payer: Self-pay | Admitting: Adult Health

## 2020-01-27 ENCOUNTER — Encounter: Payer: Self-pay | Admitting: Neurology

## 2020-01-27 ENCOUNTER — Other Ambulatory Visit: Payer: Self-pay

## 2020-01-27 DIAGNOSIS — R635 Abnormal weight gain: Secondary | ICD-10-CM

## 2020-01-27 DIAGNOSIS — E663 Overweight: Secondary | ICD-10-CM

## 2020-01-27 DIAGNOSIS — G43019 Migraine without aura, intractable, without status migrainosus: Secondary | ICD-10-CM

## 2020-01-27 DIAGNOSIS — R569 Unspecified convulsions: Secondary | ICD-10-CM

## 2020-01-27 MED ORDER — AIMOVIG 140 MG/ML ~~LOC~~ SOAJ: 1.0000 | SUBCUTANEOUS | 11 refills | Status: DC

## 2020-01-27 MED ORDER — ZOLMITRIPTAN 5 MG NA SOLN: NASAL | 11 refills | Status: DC

## 2020-01-27 MED ORDER — TOPIRAMATE 100 MG PO TABS: ORAL_TABLET | ORAL | 3 refills | Status: DC

## 2020-01-27 NOTE — Telephone Encounter (Signed)
One of patients providers recommended gastric bypass and she wanted to check to see if that could be done due to the medication she is taking. These medications she give insurance the ok to pay for it.
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
## 2020-01-28 ENCOUNTER — Encounter: Payer: Self-pay | Admitting: Neurology

## 2020-01-28 ENCOUNTER — Telehealth: Payer: Self-pay | Admitting: General Surgery

## 2020-01-28 ENCOUNTER — Ambulatory Visit (INDEPENDENT_AMBULATORY_CARE_PROVIDER_SITE_OTHER): Payer: Medicare Other | Admitting: Gastroenterology

## 2020-01-28 ENCOUNTER — Telehealth: Payer: Self-pay

## 2020-01-28 ENCOUNTER — Encounter: Payer: Self-pay | Admitting: Gastroenterology

## 2020-01-28 VITALS — Ht 69.0 in | Wt 190.0 lb

## 2020-01-28 DIAGNOSIS — K5900 Constipation, unspecified: Secondary | ICD-10-CM

## 2020-01-28 MED ORDER — TRULANCE 3 MG PO TABS: 1.0000 | ORAL_TABLET | Freq: Every day | ORAL | 11 refills | Status: DC

## 2020-01-28 NOTE — Patient Instructions (Addendum)
If you are age 42 or older, your body mass index should be between 23-30. Your Body mass index is 28.06 kg/m. If this is out of the aforementioned range listed, please consider follow up with your Primary Care Provider.  If you are age 39 or younger, your body mass index should be between 19-25. Your Body mass index is 28.06 kg/m. If this is out of the aformentioned range listed, please consider follow up with your Primary Care Provider.   We will prescribe Trulance 3 mg pills 30 days with 11 refills, 1 pill once daily.  I am happy to refill this again in 1 year however if she still needs it in 2 years it would require a return office visit  She knows that she needs to reach out to [place] [facility] to consider gastric bypass  Due to recent changes in healthcare laws, you may see the results of your imaging and laboratory studies on MyChart before your provider has had a chance to review them.  We understand that in some cases there may be results that are confusing or concerning to you. Not all laboratory results come back in the same time frame and the provider may be waiting for multiple results in order to interpret others.  Please give [place] 48 hours in order for your provider to thoroughly review all the results before contacting the office for clarification of your results.   Thank you for choosing me and [facility]

## 2020-01-28 NOTE — Telephone Encounter (Signed)
Tried to contact the patient to go over her meds and update her chart for this mornings virtual visit. Left a message for the patient to contact me back.

## 2020-01-28 NOTE — Telephone Encounter (Signed)
The patient called and we updated her chart for the virtual visit.

## 2020-01-28 NOTE — Progress Notes (Addendum)
Molly [name] (Key: [id number]) Rx #[id number] Aimovig 140MG /ML auto-injectors   Form [employer] Electronic PA Form Created 17 hours ago Sent to Plan 20 minutes ago Plan Response 20 minutes ago Submit Clinical Questions 18 minutes ago Determination Favorable 1 minute ago Message from Drake request has been approved

## 2020-01-28 NOTE — Telephone Encounter (Signed)
Called patient to let her know that her Aimovig medication was approved by her insurance and she can now go pick it up at her pharm. Thanks!

## 2020-01-28 NOTE — Telephone Encounter (Signed)
Pt agreed to referral and order placed in Epic.  Nothing further needed.

## 2020-01-28 NOTE — Telephone Encounter (Signed)
Patient is retuning Chelsea's call regarding a medication denial. Chelsea please call patient
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
## 2020-01-28 NOTE — Telephone Encounter (Signed)
She does not fit criteria based on BMI for bariatric surgery. If she would like we can refer to [facility]

## 2020-02-03 ENCOUNTER — Telehealth: Payer: Self-pay | Admitting: Neurology

## 2020-02-03 NOTE — Telephone Encounter (Signed)
Pt called no answer voice mail left for pt to call back 

## 2020-02-03 NOTE — Telephone Encounter (Signed)
Spoke to pt went over where to give the aimovig and gave her the [email or web address]-[email or web address] web site. Pt asked if there is a numbing medication she can use before she takes the medication because the pharmacy told her she would need it because it hurts. Pt advised ive never been told it hurt that bad because it is a small needle.

## 2020-02-03 NOTE — Telephone Encounter (Signed)
Patient called requested a call back about how to administer Aimovig.   She said the pharmacist could not tell her where the shot goes and also recommended she request some numbing medication for the injection site.  [facility] [address]

## 2020-02-11 ENCOUNTER — Encounter: Payer: Self-pay | Admitting: Orthopaedic Surgery

## 2020-02-11 ENCOUNTER — Ambulatory Visit (INDEPENDENT_AMBULATORY_CARE_PROVIDER_SITE_OTHER): Payer: Medicare Other | Admitting: Orthopaedic Surgery

## 2020-02-11 ENCOUNTER — Other Ambulatory Visit: Payer: Self-pay

## 2020-02-11 ENCOUNTER — Ambulatory Visit (INDEPENDENT_AMBULATORY_CARE_PROVIDER_SITE_OTHER): Payer: Medicare Other

## 2020-02-11 VITALS — Ht 69.0 in | Wt 192.0 lb

## 2020-02-11 DIAGNOSIS — M1611 Unilateral primary osteoarthritis, right hip: Secondary | ICD-10-CM | POA: Diagnosis not present

## 2020-02-11 DIAGNOSIS — F250 Schizoaffective disorder, bipolar type: Secondary | ICD-10-CM | POA: Diagnosis not present

## 2020-02-11 DIAGNOSIS — F411 Generalized anxiety disorder: Secondary | ICD-10-CM | POA: Diagnosis not present

## 2020-02-11 DIAGNOSIS — M25551 Pain in right hip: Secondary | ICD-10-CM

## 2020-02-11 DIAGNOSIS — F429 Obsessive-compulsive disorder, unspecified: Secondary | ICD-10-CM | POA: Diagnosis not present
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
## 2020-02-25 ENCOUNTER — Other Ambulatory Visit: Payer: Self-pay | Admitting: Adult Health

## 2020-02-25 DIAGNOSIS — Z760 Encounter for issue of repeat prescription: Secondary | ICD-10-CM

## 2020-02-27 ENCOUNTER — Telehealth: Payer: Self-pay | Admitting: Orthopaedic Surgery

## 2020-02-27 ENCOUNTER — Other Ambulatory Visit: Payer: Self-pay

## 2020-02-27 ENCOUNTER — Encounter: Payer: Self-pay | Admitting: Physical Medicine and Rehabilitation

## 2020-02-27 ENCOUNTER — Ambulatory Visit (INDEPENDENT_AMBULATORY_CARE_PROVIDER_SITE_OTHER): Payer: Medicare Other | Admitting: Physical Medicine and Rehabilitation

## 2020-02-27 DIAGNOSIS — M25551 Pain in right hip: Secondary | ICD-10-CM

## 2020-02-27 DIAGNOSIS — M1611 Unilateral primary osteoarthritis, right hip: Secondary | ICD-10-CM

## 2020-02-27 NOTE — Progress Notes (Signed)
Pt states pain in right hip (groin pain) that radiates on the inside of the right thigh. Pt states pain started back a month ago after last injection 09/03/19 that helped a lot. Pt states pain happens more when she walks and nothing helps with pain.   .Numeric Pain Rating Scale and Functional Assessment Average Pain 10   In the last MONTH (on 0-10 scale) has pain interfered with the following?  1. General activity like being  able to carry out your everyday physical activities such as walking, climbing stairs, carrying groceries, or moving a chair?  Rating(10)    -Dye Allergies.

## 2020-02-27 NOTE — Telephone Encounter (Signed)
Patient called. She would like a referral for water therapy. She did not say where to. Her call back number is [phone]

## 2020-02-27 NOTE — Telephone Encounter (Signed)
She has been seen for her Right hip OA. Please advise

## 2020-02-27 NOTE — Telephone Encounter (Signed)
Ok for referral?

## 2020-02-27 NOTE — Telephone Encounter (Signed)
Order placed for Water PT at [facility] on [address].

## 2020-03-04 DIAGNOSIS — F411 Generalized anxiety disorder: Secondary | ICD-10-CM | POA: Diagnosis not present

## 2020-03-04 DIAGNOSIS — F429 Obsessive-compulsive disorder, unspecified: Secondary | ICD-10-CM | POA: Diagnosis not present

## 2020-03-04 DIAGNOSIS — F250 Schizoaffective disorder, bipolar type: Secondary | ICD-10-CM | POA: Diagnosis not present

## 2020-03-06 DIAGNOSIS — F411 Generalized anxiety disorder: Secondary | ICD-10-CM | POA: Diagnosis not present

## 2020-03-06 DIAGNOSIS — F258 Other schizoaffective disorders: Secondary | ICD-10-CM | POA: Diagnosis not present

## 2020-03-06 DIAGNOSIS — F429 Obsessive-compulsive disorder, unspecified: Secondary | ICD-10-CM | POA: Diagnosis not present

## 2020-03-12 ENCOUNTER — Telehealth: Payer: Self-pay | Admitting: Adult Health

## 2020-03-12 NOTE — Telephone Encounter (Signed)
Pt wanted to inform [name] that she would need a new nutritionist. The one she was referred to said that she was one point off the [facility] scale. She has stopped eating because she has gained so much weight she also has had numbness in her thumb and index finger for about 4 weeks.

## 2020-03-12 NOTE — Telephone Encounter (Signed)
Scheduled

## 2020-03-17 ENCOUNTER — Other Ambulatory Visit: Payer: Self-pay

## 2020-03-18 ENCOUNTER — Ambulatory Visit: Payer: Medicare Other | Admitting: Adult Health

## 2020-03-18 ENCOUNTER — Ambulatory Visit: Payer: Medicare Other | Admitting: Physical Medicine and Rehabilitation

## 2020-03-18 ENCOUNTER — Other Ambulatory Visit: Payer: Self-pay

## 2020-03-18 ENCOUNTER — Other Ambulatory Visit: Payer: Self-pay | Admitting: Adult Health

## 2020-03-18 DIAGNOSIS — G4700 Insomnia, unspecified: Secondary | ICD-10-CM

## 2020-03-18 NOTE — Telephone Encounter (Signed)
1 month supply sent to the pharmacy.  Pt due for cpx.

## 2020-03-19 ENCOUNTER — Other Ambulatory Visit: Payer: Self-pay | Admitting: Adult Health

## 2020-03-19 ENCOUNTER — Telehealth: Payer: Self-pay | Admitting: *Deleted

## 2020-03-19 ENCOUNTER — Ambulatory Visit (INDEPENDENT_AMBULATORY_CARE_PROVIDER_SITE_OTHER): Payer: Medicare Other | Admitting: Internal Medicine

## 2020-03-19 ENCOUNTER — Encounter: Payer: Self-pay | Admitting: Internal Medicine

## 2020-03-19 VITALS — BP 130/90 | HR 81 | Temp 98.2°F | Wt 186.5 lb

## 2020-03-19 DIAGNOSIS — G5602 Carpal tunnel syndrome, left upper limb: Secondary | ICD-10-CM | POA: Diagnosis not present

## 2020-03-19 NOTE — Telephone Encounter (Signed)
Error/ Duplicate

## 2020-03-19 NOTE — Patient Instructions (Signed)
-Nice seeing you today!!  -Wear your wrist splint on the left hand for at least 18 hours a day.  Come back to see [place] in 4-6 weeks if no better.   Carpal Tunnel Syndrome  Carpal tunnel syndrome is a condition that causes pain in your hand and arm. The carpal tunnel is a narrow area that is on the palm side of your wrist. Repeated wrist motion or certain diseases may cause swelling in the tunnel. This swelling can pinch the main nerve in the wrist (median nerve). What are the causes? This condition may be caused by:  Repeated wrist motions.  Wrist injuries.  Arthritis.  A sac of fluid (cyst) or abnormal growth (tumor) in the carpal tunnel.  Fluid buildup during pregnancy. Sometimes the cause is not known. What increases the risk? The following factors may make you more likely to develop this condition:  Having a job in which you move your wrist in the same way many times. This includes jobs like being a Software engineer or a Scientist, water quality.  Being a woman.  Having other health conditions, such as: ? Diabetes. ? Obesity. ? A thyroid gland that is not active enough (hypothyroidism). ? Kidney failure. What are the signs or symptoms? Symptoms of this condition include:  A tingling feeling in your fingers.  Tingling or a loss of feeling (numbness) in your hand.  Pain in your entire arm. This pain may get worse when you bend your wrist and elbow for a long time.  Pain in your wrist that goes up your arm to your shoulder.  Pain that goes down into your palm or fingers.  A weak feeling in your hands. You may find it hard to grab and hold items. You may feel worse at night. How is this diagnosed? This condition is diagnosed with a medical history and physical exam. You may also have tests, such as:  Electromyogram (EMG). This test checks the signals that the nerves send to the muscles.  Nerve conduction study. This test checks how well signals pass through your nerves.  Imaging tests,  such as X-rays, ultrasound, and MRI. These tests check for what might be the cause of your condition. How is this treated? This condition may be treated with:  Lifestyle changes. You will be asked to stop or change the activity that caused your problem.  Doing exercise and activities that make bones and muscles stronger (physical therapy).  Learning how to use your hand again (occupational therapy).  Medicines for pain and swelling (inflammation). You may have injections in your wrist.  A wrist splint.  Surgery. Follow these instructions at home: If you have a splint:  Wear the splint as told by your doctor. Remove it only as told by your doctor.  Loosen the splint if your fingers: ? Tingle. ? Lose feeling (become numb). ? Turn cold and blue.  Keep the splint clean.  If the splint is not waterproof: ? Do not let it get wet. ? Cover it with a watertight covering when you take a bath or a shower. Managing pain, stiffness, and swelling   If told, put ice on the painful area: ? If you have a removable splint, remove it as told by your doctor. ? Put ice in a plastic bag. ? Place a towel between your skin and the bag. ? Leave the ice on for 20 minutes, 2-3 times per day. General instructions  Take over-the-counter and prescription medicines only as told by your doctor.  Rest  your wrist from any activity that may cause pain. If needed, talk with your boss at work about changes that can help your wrist heal.  Do any exercises as told by your doctor, physical therapist, or occupational therapist.  Keep all follow-up visits as told by your doctor. This is important. Contact a doctor if:  You have new symptoms.  Medicine does not help your pain.  Your symptoms get worse. Get help right away if:  You have very bad numbness or tingling in your wrist or hand. Summary  Carpal tunnel syndrome is a condition that causes pain in your hand and arm.  It is often caused by  repeated wrist motions.  Lifestyle changes and medicines are used to treat this problem. Surgery may help in very bad cases.  Follow your doctor's instructions about wearing a splint, resting your wrist, keeping follow-up visits, and calling for help. This information is not intended to replace advice given to you by your health care provider. Make sure you discuss any questions you have with your health care provider. Document Revised: 03/08/2018 Document Reviewed: 03/08/2018 Elsevier Patient Education  [employer].

## 2020-03-19 NOTE — Telephone Encounter (Signed)
Medication Refill:  Cetirizine  Ph[facility]acy: [facility] [address]  Joylene Igo: [phone]   Pt stated she can not go the weekend without this medication and wonders if he will send it in ASAP. Informed pt that I cannot guarantee due to being so close to the end of the day.

## 2020-03-19 NOTE — Telephone Encounter (Signed)
The one I referred her to takes her insurance. I am not sure who else in the area takes her insurance. She can call around but most are self pay.
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
## 2020-03-19 NOTE — Telephone Encounter (Signed)
Patient is requesting a referral to a nutritionists.  She would prefer not to go to the one she has seen before.

## 2020-03-23 NOTE — Telephone Encounter (Signed)
30 DAY SUPPLY SENT TO THE PHARMACY.  PT IS PAST DUE FOR CPX. 

## 2020-03-24 ENCOUNTER — Ambulatory Visit (INDEPENDENT_AMBULATORY_CARE_PROVIDER_SITE_OTHER): Payer: Medicare Other | Admitting: Family Medicine

## 2020-03-24 ENCOUNTER — Ambulatory Visit: Payer: Medicare Other | Admitting: Adult Health

## 2020-03-24 NOTE — Telephone Encounter (Signed)
PT notified to contact her insurance and return call.  Nothing further needed at this time.

## 2020-03-25 ENCOUNTER — Other Ambulatory Visit: Payer: Self-pay

## 2020-03-25 ENCOUNTER — Emergency Department (HOSPITAL_BASED_OUTPATIENT_CLINIC_OR_DEPARTMENT_OTHER)
Admission: EM | Admit: 2020-03-25 | Discharge: 2020-03-25 | Disposition: A | Discharge status: Home / Self Care | Payer: Medicare Other | Attending: Emergency Medicine | Admitting: Emergency Medicine

## 2020-03-25 ENCOUNTER — Emergency Department (HOSPITAL_COMMUNITY)
Admission: EM | Admit: 2020-03-25 | Discharge: 2020-03-25 | Discharge status: Against Medical Advice | Payer: Medicare Other

## 2020-03-25 ENCOUNTER — Encounter (HOSPITAL_BASED_OUTPATIENT_CLINIC_OR_DEPARTMENT_OTHER): Payer: Self-pay

## 2020-03-25 DIAGNOSIS — X58XXXA Exposure to other specified factors, initial encounter: Secondary | ICD-10-CM | POA: Diagnosis not present

## 2020-03-25 DIAGNOSIS — Z5321 Procedure and treatment not carried out due to patient leaving prior to being seen by health care provider: Secondary | ICD-10-CM | POA: Diagnosis not present

## 2020-03-25 DIAGNOSIS — Y999 Unspecified external cause status: Secondary | ICD-10-CM | POA: Diagnosis not present

## 2020-03-25 DIAGNOSIS — Y939 Activity, unspecified: Secondary | ICD-10-CM | POA: Insufficient documentation

## 2020-03-25 DIAGNOSIS — Y929 Unspecified place or not applicable: Secondary | ICD-10-CM | POA: Insufficient documentation

## 2020-03-25 DIAGNOSIS — S61012A Laceration without foreign body of left thumb without damage to nail, initial encounter: Secondary | ICD-10-CM | POA: Diagnosis not present

## 2020-03-25 NOTE — ED Triage Notes (Signed)
Approx 45 mins ago, left thumb lac, bleeding controlled

## 2020-03-25 NOTE — ED Notes (Signed)
Per registration, pt LWBS after triage

## 2020-03-25 NOTE — ED Triage Notes (Signed)
Unable to locate patient at triage/waiting area multiple times by staff.

## 2020-03-26 DIAGNOSIS — S61012A Laceration without foreign body of left thumb without damage to nail, initial encounter: Secondary | ICD-10-CM | POA: Diagnosis not present

## 2020-03-26 DIAGNOSIS — W260XXA Contact with knife, initial encounter: Secondary | ICD-10-CM | POA: Diagnosis not present

## 2020-04-03 ENCOUNTER — Other Ambulatory Visit: Payer: Self-pay | Admitting: Adult Health

## 2020-04-06 DIAGNOSIS — F429 Obsessive-compulsive disorder, unspecified: Secondary | ICD-10-CM | POA: Diagnosis not present

## 2020-04-06 DIAGNOSIS — F250 Schizoaffective disorder, bipolar type: Secondary | ICD-10-CM | POA: Diagnosis not present

## 2020-04-06 DIAGNOSIS — F411 Generalized anxiety disorder: Secondary | ICD-10-CM | POA: Diagnosis not present

## 2020-04-06 NOTE — Telephone Encounter (Signed)
SENT TO THE PHARMACY BY E-SCRIBE FOR 1 INHALER.  PT HAS UPCOMING CPX.

## 2020-04-07 ENCOUNTER — Ambulatory Visit: Payer: Medicare Other | Admitting: Adult Health

## 2020-04-10 ENCOUNTER — Other Ambulatory Visit: Payer: Self-pay | Admitting: Adult Health

## 2020-04-14 ENCOUNTER — Institutional Professional Consult (permissible substitution): Payer: Medicare Other | Admitting: Internal Medicine

## 2020-04-14 NOTE — Telephone Encounter (Signed)
SENT TO THE PHARMACY BY E-SCRIBE.  PT IS SCHEDULED FOR UPCOMING CPX.

## 2020-04-22 ENCOUNTER — Other Ambulatory Visit: Payer: Self-pay

## 2020-04-22 ENCOUNTER — Encounter: Payer: Self-pay | Admitting: Internal Medicine

## 2020-04-22 ENCOUNTER — Telehealth: Payer: Self-pay | Admitting: Adult Health

## 2020-04-22 ENCOUNTER — Ambulatory Visit (INDEPENDENT_AMBULATORY_CARE_PROVIDER_SITE_OTHER): Payer: Medicare Other

## 2020-04-22 ENCOUNTER — Ambulatory Visit (INDEPENDENT_AMBULATORY_CARE_PROVIDER_SITE_OTHER): Payer: Medicare Other | Admitting: Internal Medicine

## 2020-04-22 DIAGNOSIS — R110 Nausea: Secondary | ICD-10-CM

## 2020-04-22 DIAGNOSIS — R058 Other specified cough: Secondary | ICD-10-CM

## 2020-04-22 DIAGNOSIS — R05 Cough: Secondary | ICD-10-CM | POA: Diagnosis not present

## 2020-04-22 DIAGNOSIS — R1012 Left upper quadrant pain: Secondary | ICD-10-CM | POA: Diagnosis not present

## 2020-04-22 LAB — CBC WITH DIFFERENTIAL/PLATELET
Basophils Absolute: 0 10*3/uL (ref 0.0–0.1)
Basophils Relative: 0.2 % (ref 0.0–3.0)
Eosinophils Absolute: 0 10*3/uL (ref 0.0–0.7)
Eosinophils Relative: 0.1 % (ref 0.0–5.0)
HCT: 37.1 % (ref 36.0–46.0)
Hemoglobin: 12.6 g/dL (ref 12.0–15.0)
Lymphocytes Relative: 28.4 % (ref 12.0–46.0)
Lymphs Abs: 2.2 10*3/uL (ref 0.7–4.0)
MCHC: 34 g/dL (ref 30.0–36.0)
MCV: 93.2 fl (ref 78.0–100.0)
Monocytes Absolute: 0.5 10*3/uL (ref 0.1–1.0)
Monocytes Relative: 6.8 % (ref 3.0–12.0)
Neutro Abs: 5 10*3/uL (ref 1.4–7.7)
Neutrophils Relative %: 64.5 % (ref 43.0–77.0)
Platelets: 243 10*3/uL (ref 150.0–400.0)
RBC: 3.98 Mil/uL (ref 3.87–5.11)
RDW: 13.1 % (ref 11.5–15.5)
WBC: 7.7 10*3/uL (ref 4.0–10.5)

## 2020-04-22 MED ORDER — CETIRIZINE HCL 10 MG PO TABS: 10.0000 mg | ORAL_TABLET | Freq: Every day | ORAL | 1 refills | Status: AC

## 2020-04-22 MED ORDER — OMEPRAZOLE 20 MG PO CPDR: DELAYED_RELEASE_CAPSULE | ORAL | 3 refills | Status: DC

## 2020-04-22 MED ORDER — PREDNISONE 10 MG PO TABS: ORAL_TABLET | ORAL | 0 refills | Status: DC
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
## 2020-04-22 NOTE — Telephone Encounter (Signed)
Pt is requesting a refill on Zyrtec 10 mg. Pt is out of medication and has a physical in July, 2021. Pt uses [facility]-[id number] [place]

## 2020-04-22 NOTE — Telephone Encounter (Signed)
Sent to the pharmacy by e-scribe. 

## 2020-04-22 NOTE — Patient Instructions (Addendum)
Zyrtec should be in am and should also add another dose of flonase and astelin in am   For drainage / throat tickle try take CHLORPHENIRAMINE  4 mg  (Chlortab 4mg   at [employer] should be easiest to find in the green box)  take one every 4 hours as needed - available over the counter- may cause drowsiness so start with just a bedtime dose or two and see how you tolerate it before trying in daytime    Prednisone 10 mg take  4 each am x 2 days,   2 each am x 2 days,  1 each am x 2 days and stop    Prilosec should be Take 30- 60 min before your first and last meals of the day   GERD (REFLUX)  is an extremely common cause of respiratory symptoms just like yours , many times with no obvious heartburn at all.    It can be treated with medication, but also with lifestyle changes including elevation of the head of your bed (ideally with 6 -8inch blocks under the headboard of your bed),  Smoking cessation, avoidance of late meals, excessive alcohol, and avoid fatty foods, chocolate, peppermint, colas, red wine, and acidic juices such as orange juice.  NO MINT OR MENTHOL PRODUCTS SO NO COUGH DROPS  USE SUGARLESS CANDY INSTEAD (Jolley ranchers or Stover's or Life Savers) or even ice chips will also do - the key is to swallow to prevent all throat clearing. NO OIL BASED VITAMINS - use powdered substitutes.  Avoid fish oil when coughing.    Please remember to go to the lab and x-ray department   for your tests - we will call you with the results when they are available.    Please schedule a follow up office visit in 6 weeks, call sooner if needed with all medications /inhalers/ solutions in hand so we can verify exactly what you are taking. This includes all medications from all doctors and over the counters

## 2020-04-23 ENCOUNTER — Encounter: Payer: Self-pay | Admitting: Internal Medicine

## 2020-04-23 LAB — RESPIRATORY ALLERGY PROFILE REGION II ~~LOC~~
Allergen, A. alternata, m6: <0.1 kU/L
Allergen, Cedar tree, t12: 4.05 kU/L — ABNORMAL HIGH
Allergen, Comm Silver Birch, t9: <0.1 kU/L
Allergen, Cottonwood, t14: <0.1 kU/L
Allergen, D pternoyssinus,d7: <0.1 kU/L
Allergen, Mouse Urine Protein, e78: <0.1 kU/L
Allergen, Mulberry, t76: <0.1 kU/L
Allergen, Oak,t7: <0.1 kU/L
Allergen, P. notatum, m1: <0.1 kU/L
Aspergillus fumigatus, m3: <0.1 kU/L
Bermuda Grass: <0.1 kU/L
Box Elder IgE: <0.1 kU/L
CLADOSPORIUM HERBARUM (M2) IGE: <0.1 kU/L
COMMON RAGWEED (SHORT) (W1) IGE: <0.1 kU/L
Cat Dander: <0.1 kU/L
Class: 0
Class: 0
Class: 0
Class: 0
Class: 0
Class: 0
Class: 0
Class: 0
Class: 0
Class: 0
Class: 0
Class: 0
Class: 0
Class: 0
Class: 0
Class: 0
Class: 0
Class: 0
Class: 0
Class: 0
Class: 0
Class: 0
Class: 0
Class: 3
Cockroach: <0.1 kU/L
D. farinae: <0.1 kU/L
Dog Dander: <0.1 kU/L
Elm IgE: <0.1 kU/L
IgE (Immunoglobulin E), Serum: 35 kU/L (ref <=114)
Johnson Grass: <0.1 kU/L
Pecan/Hickory Tree IgE: <0.1 kU/L
Rough Pigweed  IgE: <0.1 kU/L
Sheep Sorrel IgE: <0.1 kU/L
Timothy Grass: <0.1 kU/L

## 2020-04-23 LAB — INTERPRETATION:

## 2020-04-23 NOTE — Progress Notes (Signed)
Spoke with patient and provided her CXR results.  She verbalized understanding.

## 2020-04-23 NOTE — Assessment & Plan Note (Addendum)
Onset age 44  - Allergy profile 04/22/2020 >  Eos 0.0 /  IgE  Pending  - 04/22/2020 rx max for gerd/ pnds with 1st gen H1 blockers per guidelines    The most common causes of chronic cough in immunocompetent adults include the following: upper airway cough syndrome (UACS), previously referred to as postnasal drip syndrome (PNDS), which is caused by variety of rhinosinus conditions; (2) asthma; (3) GERD; (4) chronic bronchitis from cigarette smoking or other inhaled environmental irritants; (5) nonasthmatic eosinophilic bronchitis; and (6) bronchiectasis.   These conditions, singly or in combination, have accounted for up to 94% of the causes of chronic cough in prospective studies.   Other conditions have constituted no >6% of the causes in prospective studies These have included bronchogenic carcinoma, chronic interstitial pneumonia, sarcoidosis, left ventricular failure, ACEI-induced cough, and aspiration from a condition associated with pharyngeal dysfunction.    Chronic cough is often simultaneously caused by more than one condition. A single cause has been found from 38 to 82% of the time, multiple causes from 18 to 62%. Multiply caused cough has been the result of three diseases up to 42% of the time.       Most likely this is not asthma but rather Upper airway cough syndrome (previously labeled PNDS),  is so named because it's frequently impossible to sort out how much is  CR/sinusitis with freq throat clearing (which can be related to primary GERD)   vs  causing  secondary (" extra esophageal")  GERD from wide swings in gastric pressure that occur with throat clearing, often  promoting self use of mint and menthol lozenges that reduce the lower esophageal sphincter tone and exacerbate the problem further in a cyclical fashion.   These are the same pts (now being labeled as having "irritable larynx syndrome" by some cough centers) who not infrequently have a history of having failed to tolerate  ace inhibitors,  dry powder inhalers or biphosphonates or report having atypical/extraesophageal reflux symptoms that don't respond to standard doses of PPI  and are easily confused as having aecopd or asthma flares by even experienced allergists/ pulmonologists (myself included).   Rec:  Start short course prednisone and add  1st gen H1 blockers per guidelines  Plus  max rx for gerd and f/u in 6 weeks  with all meds in hand using a trust but verify approach to confirm accurate Medication  Reconciliation The principal here is that until we are certain that the  patients are doing what we've asked, it makes no sense to ask them to do more.    Advised: The standardized cough guidelines published in Chest by Lissa Morales in 2006 are still the best available and consist of a multiple step process (up to 12!) , not a single office visit,  and are intended  to address this problem logically,  with an alogrithm dependent on response to empiric treatment at  each progressive step  to determine a specific diagnosis with  minimal addtional testing needed. Therefore if adherence is an issue or can't be accurately verified,  it's very unlikely the standard evaluation and treatment will be successful here.    Furthermore, response to therapy (other than acute cough suppression, which should only be used short term with avoidance of narcotic containing cough syrups if possible), can be a gradual process for which the patient is not likely to  perceive immediate benefit.  Unlike going to an eye doctor where the best perscription is almost always  the first one and is immediately effective, this is almost never the case in the management of chronic cough syndromes. Therefore the patient needs to commit up front to consistently adhere to recommendations  for up to 6 weeks of therapy directed at the likely underlying problem(s) before the response can be reasonably evaluated.           Each maintenance medication was  reviewed in detail including emphasizing most importantly the difference between maintenance and prns and under what circumstances the prns are to be triggered using an action plan format where appropriate.  Total time for H and P, chart review, counseling, teaching device (nasal applications)  and generating customized AVS unique to this office visit / charting = 45 min

## 2020-04-26 NOTE — Progress Notes (Signed)
Spoke with pt and notified of results per Dr. Wert. Pt verbalized understanding and denied any questions. 

## 2020-04-28 ENCOUNTER — Other Ambulatory Visit: Payer: Self-pay | Admitting: Adult Health

## 2020-05-03 ENCOUNTER — Other Ambulatory Visit: Payer: Self-pay | Admitting: Adult Health

## 2020-05-03 ENCOUNTER — Encounter: Payer: Self-pay | Admitting: Neurology

## 2020-05-03 NOTE — Progress Notes (Signed)
error 

## 2020-05-03 NOTE — Progress Notes (Addendum)
Molly [name] (KeyWendy Poet) Rx #: [id number] Aimovig 140MG /ML auto-injectors   Form [employer] Electronic PA Form [phone] NCPDP) Created 1 hour ago Sent to Plan 30 minutes ago Plan Response 30 minutes ago Submit Clinical Questions 29 minutes ago Determination Favorable 4 minutes ago Message from Plan Your request has been approved through 05/03/21.

## 2020-05-04 NOTE — Telephone Encounter (Signed)
DENIED.  SENT TO THE PHARMACY BY E-SCRIBE ON 04/22/20.

## 2020-05-12 ENCOUNTER — Other Ambulatory Visit: Payer: Self-pay | Admitting: Adult Health

## 2020-05-13 NOTE — Telephone Encounter (Signed)
SENT TO THE PHARMACY BY E-SCRIBE. 

## 2020-05-19 ENCOUNTER — Encounter: Payer: Medicare Other | Admitting: Adult Health

## 2020-05-19 ENCOUNTER — Other Ambulatory Visit (INDEPENDENT_AMBULATORY_CARE_PROVIDER_SITE_OTHER): Payer: Medicare Other

## 2020-05-19 ENCOUNTER — Ambulatory Visit (INDEPENDENT_AMBULATORY_CARE_PROVIDER_SITE_OTHER): Payer: Medicare Other | Admitting: Adult Health

## 2020-05-19 ENCOUNTER — Other Ambulatory Visit: Payer: Self-pay

## 2020-05-19 ENCOUNTER — Encounter: Payer: Self-pay | Admitting: Adult Health

## 2020-05-19 ENCOUNTER — Telehealth: Payer: Self-pay | Admitting: Gastroenterology

## 2020-05-19 VITALS — BP 116/76 | Temp 98.0°F | Ht 69.0 in | Wt 176.0 lb

## 2020-05-19 DIAGNOSIS — F310 Bipolar disorder, current episode hypomanic: Secondary | ICD-10-CM

## 2020-05-19 DIAGNOSIS — G40909 Epilepsy, unspecified, not intractable, without status epilepticus: Secondary | ICD-10-CM

## 2020-05-19 DIAGNOSIS — G43001 Migraine without aura, not intractable, with status migrainosus: Secondary | ICD-10-CM

## 2020-05-19 DIAGNOSIS — E7800 Pure hypercholesterolemia, unspecified: Secondary | ICD-10-CM

## 2020-05-19 DIAGNOSIS — Z1231 Encounter for screening mammogram for malignant neoplasm of breast: Secondary | ICD-10-CM | POA: Diagnosis not present

## 2020-05-19 DIAGNOSIS — F329 Major depressive disorder, single episode, unspecified: Secondary | ICD-10-CM | POA: Diagnosis not present

## 2020-05-19 DIAGNOSIS — G4700 Insomnia, unspecified: Secondary | ICD-10-CM

## 2020-05-19 DIAGNOSIS — F419 Anxiety disorder, unspecified: Secondary | ICD-10-CM

## 2020-05-19 LAB — CBC WITH DIFFERENTIAL/PLATELET
Basophils Absolute: 0 10*3/uL (ref 0.0–0.1)
Basophils Relative: 0.4 % (ref 0.0–3.0)
Eosinophils Absolute: 0 10*3/uL (ref 0.0–0.7)
Eosinophils Relative: 0.2 % (ref 0.0–5.0)
HCT: 36.1 % (ref 36.0–46.0)
Hemoglobin: 12.1 g/dL (ref 12.0–15.0)
Lymphocytes Relative: 30.3 % (ref 12.0–46.0)
Lymphs Abs: 2.1 10*3/uL (ref 0.7–4.0)
MCHC: 33.6 g/dL (ref 30.0–36.0)
MCV: 92.6 fl (ref 78.0–100.0)
Monocytes Absolute: 0.6 10*3/uL (ref 0.1–1.0)
Monocytes Relative: 7.9 % (ref 3.0–12.0)
Neutro Abs: 4.3 10*3/uL (ref 1.4–7.7)
Neutrophils Relative %: 61.2 % (ref 43.0–77.0)
Platelets: 245 10*3/uL (ref 150.0–400.0)
RBC: 3.9 Mil/uL (ref 3.87–5.11)
RDW: 12.9 % (ref 11.5–15.5)
WBC: 7 10*3/uL (ref 4.0–10.5)

## 2020-05-19 LAB — COMPREHENSIVE METABOLIC PANEL
ALT: 24 U/L (ref 0–35)
AST: 20 U/L (ref 0–37)
Albumin: 4.5 g/dL (ref 3.5–5.2)
Alkaline Phosphatase: 93 U/L (ref 39–117)
BUN: 13 mg/dL (ref 6–23)
CO2: 31 mEq/L (ref 19–32)
Calcium: 10.3 mg/dL (ref 8.4–10.5)
Chloride: 101 mEq/L (ref 96–112)
Creatinine, Ser: 1 mg/dL (ref 0.40–1.20)
GFR: 73.71 mL/min (ref >60.00)
Glucose, Bld: 90 mg/dL (ref 70–99)
Potassium: 3.6 mEq/L (ref 3.5–5.1)
Sodium: 139 mEq/L (ref 135–145)
Total Bilirubin: 0.3 mg/dL (ref 0.2–1.2)
Total Protein: 7.8 g/dL (ref 6.0–8.3)

## 2020-05-19 LAB — LIPID PANEL
Cholesterol: 270 mg/dL — ABNORMAL HIGH (ref 0–200)
HDL: 54 mg/dL (ref >39.00)
LDL Cholesterol: 181 mg/dL — ABNORMAL HIGH (ref 0–99)
NonHDL: 216.19
Total CHOL/HDL Ratio: 5
Triglycerides: 178 mg/dL — ABNORMAL HIGH (ref 0.0–149.0)
VLDL: 35.6 mg/dL (ref 0.0–40.0)

## 2020-05-19 LAB — TSH: TSH: 2.02 u[IU]/mL (ref 0.35–4.50)

## 2020-05-19 MED ORDER — QUETIAPINE FUMARATE 400 MG PO TABS: 400.0000 mg | ORAL_TABLET | Freq: Every day | ORAL | 0 refills | Status: DC

## 2020-05-19 MED ORDER — TRAZODONE HCL 100 MG PO TABS: 100.0000 mg | ORAL_TABLET | Freq: Every day | ORAL | 0 refills | Status: DC

## 2020-05-19 MED ORDER — TRINTELLIX 5 MG PO TABS: ORAL_TABLET | ORAL | 0 refills | Status: DC
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
## 2020-05-20 MED ORDER — POLYETHYLENE GLYCOL 3350 17 GM/SCOOP PO POWD
1.0000 | Freq: Every day | ORAL | 3 refills | Status: DC
Start: 2020-05-20 — End: 2022-10-03

## 2020-05-20 MED ORDER — TRULANCE 3 MG PO TABS: 1.0000 | ORAL_TABLET | Freq: Every day | ORAL | 6 refills | Status: DC

## 2020-05-20 NOTE — Telephone Encounter (Signed)
Refill for Trulance sent to pharmacy as requested.  Patient also states that insurance will no longer cover polyethylene glycol packets, but will cover powder.  She requested Rx to be sent to pharmacy.  Patient advised she could pick Miralax (polyethylene glycol) over-the-counter and it might be cheaper.  Patient agreed to plan and verbalized understanding. No further questions.

## 2020-05-21 ENCOUNTER — Other Ambulatory Visit: Payer: Self-pay | Admitting: Adult Health

## 2020-05-21 DIAGNOSIS — Z760 Encounter for issue of repeat prescription: Secondary | ICD-10-CM

## 2020-05-21 MED ORDER — ROSUVASTATIN CALCIUM 40 MG PO TABS: 40.0000 mg | ORAL_TABLET | Freq: Every day | ORAL | 0 refills | Status: DC

## 2020-05-24 ENCOUNTER — Telehealth: Payer: Self-pay | Admitting: Adult Health

## 2020-05-24 NOTE — Telephone Encounter (Signed)
Pt is having pain in her groin area and would like to know if she should see Dr. Joni Fears that gave her a shot for the grinding and popping in her hip or [facility].  Pt decline an appointment with [name] she wanted to know which one she should be seeing.  Pt would a call back.

## 2020-05-25 DIAGNOSIS — F429 Obsessive-compulsive disorder, unspecified: Secondary | ICD-10-CM | POA: Diagnosis not present

## 2020-05-25 DIAGNOSIS — F411 Generalized anxiety disorder: Secondary | ICD-10-CM | POA: Diagnosis not present

## 2020-05-25 DIAGNOSIS — F250 Schizoaffective disorder, bipolar type: Secondary | ICD-10-CM | POA: Diagnosis not present

## 2020-05-25 NOTE — Telephone Encounter (Signed)
If it is the same pain she had when she saw Dr. Durward Fortes the first time, then yes, follow up with him.   If this is something new, I would be happy to see her first

## 2020-05-25 NOTE — Telephone Encounter (Signed)
Left a message for a return call.

## 2020-05-26 NOTE — Telephone Encounter (Signed)
Spoke to the pt.  She stated the pain is the same but more towards her groin.  She will follow up with Dr. Durward Fortes.  Will call back if needed.

## 2020-06-01 DIAGNOSIS — F315 Bipolar disorder, current episode depressed, severe, with psychotic features: Secondary | ICD-10-CM | POA: Diagnosis not present

## 2020-06-01 DIAGNOSIS — F209 Schizophrenia, unspecified: Secondary | ICD-10-CM | POA: Diagnosis not present

## 2020-06-01 DIAGNOSIS — F4310 Post-traumatic stress disorder, unspecified: Secondary | ICD-10-CM | POA: Diagnosis not present

## 2020-06-02 ENCOUNTER — Ambulatory Visit
Admission: RE | Admit: 2020-06-02 | Discharge: 2020-06-02 | Disposition: A | Discharge status: Home / Self Care | Payer: Medicare Other | Source: Ambulatory Visit | Attending: Adult Health | Admitting: Adult Health

## 2020-06-02 ENCOUNTER — Ambulatory Visit: Payer: Medicare Other

## 2020-06-02 ENCOUNTER — Telehealth: Payer: Self-pay | Admitting: Physical Medicine and Rehabilitation

## 2020-06-02 ENCOUNTER — Other Ambulatory Visit: Payer: Self-pay

## 2020-06-02 ENCOUNTER — Ambulatory Visit: Payer: Medicare Other | Admitting: Orthopaedic Surgery

## 2020-06-02 DIAGNOSIS — Z1231 Encounter for screening mammogram for malignant neoplasm of breast: Secondary | ICD-10-CM

## 2020-06-02 NOTE — Telephone Encounter (Signed)
Patient stopped by  She is requesting a call back to establish whether or not her issue is something she should see Dr.Newton for   Call back: [phone]

## 2020-06-02 NOTE — Telephone Encounter (Signed)
Called patient and she is scheduled to see Dr. Durward Fortes regarding her hip/ groin pain.

## 2020-06-04 ENCOUNTER — Other Ambulatory Visit: Payer: Self-pay | Admitting: Adult Health

## 2020-06-04 DIAGNOSIS — R928 Other abnormal and inconclusive findings on diagnostic imaging of breast: Secondary | ICD-10-CM

## 2020-06-07 ENCOUNTER — Other Ambulatory Visit: Payer: Self-pay | Admitting: Neurology

## 2020-06-07 ENCOUNTER — Other Ambulatory Visit: Payer: Self-pay

## 2020-06-07 MED ORDER — ZOLMITRIPTAN 5 MG NA SOLN: NASAL | 4 refills | Status: DC

## 2020-06-08 ENCOUNTER — Ambulatory Visit: Payer: Medicare Other | Admitting: Physical Medicine and Rehabilitation

## 2020-06-08 ENCOUNTER — Encounter: Payer: Self-pay | Admitting: Orthopaedic Surgery

## 2020-06-08 ENCOUNTER — Ambulatory Visit (INDEPENDENT_AMBULATORY_CARE_PROVIDER_SITE_OTHER): Payer: Medicare Other | Admitting: Orthopaedic Surgery

## 2020-06-08 ENCOUNTER — Other Ambulatory Visit: Payer: Self-pay

## 2020-06-08 DIAGNOSIS — M1611 Unilateral primary osteoarthritis, right hip: Secondary | ICD-10-CM
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
## 2020-06-08 NOTE — Telephone Encounter (Signed)
Sent to the pharmacy by e-scribe. 

## 2020-06-10 ENCOUNTER — Ambulatory Visit (INDEPENDENT_AMBULATORY_CARE_PROVIDER_SITE_OTHER): Payer: Medicare Other | Admitting: Physical Medicine and Rehabilitation

## 2020-06-10 ENCOUNTER — Ambulatory Visit: Payer: Self-pay

## 2020-06-10 ENCOUNTER — Encounter: Payer: Self-pay | Admitting: Physical Medicine and Rehabilitation

## 2020-06-10 ENCOUNTER — Other Ambulatory Visit: Payer: Self-pay

## 2020-06-10 ENCOUNTER — Other Ambulatory Visit: Payer: Self-pay | Admitting: Adult Health

## 2020-06-10 DIAGNOSIS — M25551 Pain in right hip: Secondary | ICD-10-CM

## 2020-06-10 MED ORDER — BUPIVACAINE HCL 0.25 % IJ SOLN
4.0000 mL | INTRAMUSCULAR | Status: AC | PRN
  Administered 2020-06-10: 4 mL via INTRA_ARTICULAR

## 2020-06-10 MED ORDER — BETAMETHASONE SOD PHOS & ACET 6 (3-3) MG/ML IJ SUSP
12.0000 mg | Freq: Once | INTRAMUSCULAR | Status: AC
  Administered 2020-06-10: 12 mg

## 2020-06-10 NOTE — Progress Notes (Signed)
Pt States right groin pain. Pt state walking and standing makes a shoot pain. Pt states nothing makes it better.  Numeric Pain Rating Scale and Functional Assessment Average Pain 8   In the last MONTH (on 0-10 scale) has pain interfered with the following?  1. General activity like being  able to carry out your everyday physical activities such as walking, climbing stairs, carrying groceries, or moving a chair?  Rating(10)   +Driver, -BT, -Dye Allergies.

## 2020-06-10 NOTE — Progress Notes (Signed)
   [name] - 44 y.o. female MRN [id number]  Date of birth: 04/05/1976  Office Visit Note: Visit Date: 06/10/2020 PCP: Dorothyann Peng, NP Referred by: Dorothyann Peng, NP  Subjective: Chief Complaint  Patient presents with  . Right Hip - Pain   HPI:  [name] is a 44 y.o. female who comes in today at the request of Dr. Joni Fears for planned Right anesthetic hip arthrogram with fluoroscopic guidance.  The patient has failed conservative care including home exercise, medications, time and activity modification.  This injection will be diagnostic and hopefully therapeutic.  Please see requesting physician notes for further details and justification.  ROS Otherwise per HPI.  Assessment & Plan: Visit Diagnoses:  1. Pain in right hip     Plan: Findings:  Some relief during anesthetic phase.    Meds & Orders:  Meds ordered this encounter  Medications  . betamethasone acetate-betamethasone sodium phosphate (CELESTONE) injection 12 mg    Orders Placed This Encounter  Procedures  . Large Joint Inj: R hip joint  . XR C-ARM NO REPORT    Follow-up: No follow-ups on file.   Procedures: Large Joint Inj: R hip joint on 06/10/2020 9:37 AM Indications: pain and diagnostic evaluation Details: 22 G needle, anterior approach  Arthrogram: Yes  Medications: 4 mL bupivacaine 0.25 % Outcome: tolerated well, no immediate complications  Arthrogram demonstrated excellent flow of contrast throughout the joint surface without extravasation or obvious defect.  The patient had SOME relief of symptoms during the anesthetic phase of the injection.  Procedure, treatment alternatives, risks and benefits explained, specific risks discussed. Consent was given by the patient. Immediately prior to procedure a time out was called to verify the correct patient, procedure, equipment, support staff and site/side marked as required. Patient was prepped and draped in the usual sterile fashion.        No notes on file   Clinical History: MRI OF THE RIGHT HIP WITH CONTRAST (MR Arthrogram)  TECHNIQUE: Multiplanar, multisequence MR imaging of the hip was performed immediately following contrast injection into the hip joint under fluoroscopic guidance. No intravenous contrast was administered.  COMPARISON:  Right hip radiographs 07/15/2019  FINDINGS: Both hips are normally located. There are mild bilateral hip joint degenerative changes for age with early joint space narrowing and early spurring. No stress fracture or AVN. No bone lesions.  There is a small cartilage defect or deep chondral fissure involving the central acetabular cartilage. No osteochondral lesion. Minimal subchondral cystic change.  No definite labral tears are demonstrated.  No periarticular fluid collections to suggest a paralabral cyst.  The pubic symphysis and SI joints are intact. No pelvic fractures or bone lesions.  The surrounding hip and pelvic musculature appear normal. No muscle tear, myositis or mass.  No significant intrapelvic abnormalities.  IMPRESSION: 1. Early degenerative changes. 2. No stress fracture or AVN. 3. No definite labral tear or osteochondral lesion. 4. Normal appearance of the surrounding hip and pelvic musculature.   Electronically Signed   By: Marijo Sanes M.D.   On: 08/09/2019 11:40     Objective:  VS:  HT:    WT:   BMI:     BP:   HR: bpm  TEMP: ( )  RESP:  Physical Exam   Imaging: No results found.

## 2020-06-12 ENCOUNTER — Other Ambulatory Visit: Payer: Self-pay | Admitting: Adult Health

## 2020-06-15 NOTE — Telephone Encounter (Signed)
SENT TO THE PHARMACY BY E-SCRIBE. 

## 2020-06-16 ENCOUNTER — Encounter: Payer: Self-pay | Admitting: Internal Medicine

## 2020-06-16 ENCOUNTER — Ambulatory Visit
Admission: RE | Admit: 2020-06-16 | Discharge: 2020-06-16 | Disposition: A | Discharge status: Home / Self Care | Payer: Medicare Other | Source: Ambulatory Visit | Attending: Adult Health | Admitting: Adult Health

## 2020-06-16 ENCOUNTER — Ambulatory Visit (INDEPENDENT_AMBULATORY_CARE_PROVIDER_SITE_OTHER): Payer: Medicare Other | Admitting: Internal Medicine

## 2020-06-16 ENCOUNTER — Other Ambulatory Visit: Payer: Self-pay

## 2020-06-16 DIAGNOSIS — R928 Other abnormal and inconclusive findings on diagnostic imaging of breast: Secondary | ICD-10-CM

## 2020-06-16 DIAGNOSIS — R05 Cough: Secondary | ICD-10-CM

## 2020-06-16 DIAGNOSIS — N6012 Diffuse cystic mastopathy of left breast: Secondary | ICD-10-CM | POA: Diagnosis not present

## 2020-06-16 DIAGNOSIS — N6323 Unspecified lump in the left breast, lower outer quadrant: Secondary | ICD-10-CM | POA: Diagnosis not present

## 2020-06-16 DIAGNOSIS — R058 Other specified cough: Secondary | ICD-10-CM
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
## 2020-06-16 NOTE — Patient Instructions (Addendum)
No change in medications    I very strongly recommend you get the moderna or pfizer vaccine as soon as possible based on your risk of dying from the virus  and the proven safety and benefit of these vaccines against even the delta variant.  This can save your life as well as  those of your loved ones,  especially if they are also not vaccinated.     Please schedule a follow up visit in 3 months but call sooner if needed

## 2020-06-17 ENCOUNTER — Encounter: Payer: Self-pay | Admitting: Internal Medicine

## 2020-06-17 NOTE — Assessment & Plan Note (Signed)
Onset age 44  - Allergy profile 04/22/2020 >  Eos 0.0 /  IgE  35 RAST  pos cedar tree - 04/22/2020 rx max for gerd/ pnds with 1st gen H1 blockers per guidelines    She is exposed to cedar trees near her house but much better on combination of gerd rx and  1st gen H1 blockers per guidelines  So no change rx needed  Pt informed of the seriousness of COVID 19 infection as a direct risk to lung health  and safey and to close contacts and should continue to wear a facemask in public and minimize exposure to public locations but especially avoid any area or activity where non-close contacts are not observing distancing or wearing an appropriate face mask.  I strongly recommended she take either of the vaccines available through local drugstores based on updated information on millions of Americans treated with the [place] products  which have proven both safe and  effective even against the new delta variant.     F/u in 3 m, call sooner if needed         Each maintenance medication was reviewed in detail including emphasizing most importantly the difference between maintenance and prns and under what circumstances the prns are to be triggered using an action plan format where appropriate.  Total time for H and P, chart review, counseling,   and generating customized AVS unique to this office visit / charting = 20 min

## 2020-06-23 DIAGNOSIS — F411 Generalized anxiety disorder: Secondary | ICD-10-CM | POA: Diagnosis not present

## 2020-06-23 DIAGNOSIS — F429 Obsessive-compulsive disorder, unspecified: Secondary | ICD-10-CM | POA: Diagnosis not present

## 2020-06-23 DIAGNOSIS — F250 Schizoaffective disorder, bipolar type: Secondary | ICD-10-CM | POA: Diagnosis not present

## 2020-06-25 DIAGNOSIS — F315 Bipolar disorder, current episode depressed, severe, with psychotic features: Secondary | ICD-10-CM | POA: Diagnosis not present

## 2020-06-25 DIAGNOSIS — F209 Schizophrenia, unspecified: Secondary | ICD-10-CM | POA: Diagnosis not present

## 2020-06-25 DIAGNOSIS — F4310 Post-traumatic stress disorder, unspecified: Secondary | ICD-10-CM | POA: Diagnosis not present

## 2020-07-06 ENCOUNTER — Other Ambulatory Visit: Payer: Self-pay | Admitting: Adult Health

## 2020-07-07 ENCOUNTER — Other Ambulatory Visit: Payer: Self-pay | Admitting: Adult Health

## 2020-07-07 DIAGNOSIS — G4700 Insomnia, unspecified: Secondary | ICD-10-CM

## 2020-07-08 NOTE — Telephone Encounter (Signed)
Last filled 05/2020 for 90 tabs. Denied pt should still have medication left.

## 2020-07-09 ENCOUNTER — Other Ambulatory Visit: Payer: Self-pay | Admitting: Adult Health

## 2020-07-09 DIAGNOSIS — G4700 Insomnia, unspecified: Secondary | ICD-10-CM

## 2020-07-15 DIAGNOSIS — F250 Schizoaffective disorder, bipolar type: Secondary | ICD-10-CM | POA: Diagnosis not present

## 2020-07-15 DIAGNOSIS — F429 Obsessive-compulsive disorder, unspecified: Secondary | ICD-10-CM | POA: Diagnosis not present

## 2020-07-15 DIAGNOSIS — F411 Generalized anxiety disorder: Secondary | ICD-10-CM | POA: Diagnosis not present

## 2020-07-25 ENCOUNTER — Other Ambulatory Visit: Payer: Self-pay | Admitting: Adult Health

## 2020-07-25 DIAGNOSIS — Z760 Encounter for issue of repeat prescription: Secondary | ICD-10-CM

## 2020-07-28 ENCOUNTER — Other Ambulatory Visit: Payer: Self-pay | Admitting: Adult Health

## 2020-08-09 ENCOUNTER — Other Ambulatory Visit: Payer: Self-pay | Admitting: Adult Health

## 2020-08-09 DIAGNOSIS — F310 Bipolar disorder, current episode hypomanic: Secondary | ICD-10-CM

## 2020-08-09 DIAGNOSIS — F419 Anxiety disorder, unspecified: Secondary | ICD-10-CM

## 2020-08-10 ENCOUNTER — Other Ambulatory Visit: Payer: Self-pay | Admitting: Adult Health

## 2020-08-10 DIAGNOSIS — F310 Bipolar disorder, current episode hypomanic: Secondary | ICD-10-CM

## 2020-08-10 DIAGNOSIS — F32A Depression, unspecified: Secondary | ICD-10-CM

## 2020-08-10 NOTE — Telephone Encounter (Signed)
Has she found a new psychiatrist yet?

## 2020-08-11 ENCOUNTER — Other Ambulatory Visit: Payer: Self-pay | Admitting: Adult Health

## 2020-08-11 ENCOUNTER — Other Ambulatory Visit: Payer: Self-pay | Admitting: Internal Medicine

## 2020-08-11 DIAGNOSIS — R110 Nausea: Secondary | ICD-10-CM

## 2020-08-11 DIAGNOSIS — R1012 Left upper quadrant pain: Secondary | ICD-10-CM

## 2020-08-11 NOTE — Telephone Encounter (Signed)
Message received from pharmacy:  Changes Requested   omeprazole (PRILOSEC) 40 MG capsule       Changed from: omeprazole (PRILOSEC) 20 MG capsule    Possible duplicate: Hover to review recent actions on this medication   Sig: N/A   Disp:  Not specified  Refills:  0   Start: 08/11/2020   Class: Normal   For: Nausea without vomiting, Left upper quadrant pain   Last ordered: 3 months ago by Tanda Rockers, MD Last refill: 08/11/2020   Rx #: [id number]   Pharmacy comment: Alternative Requested:INS WILL ONLY COVED 1 CAPSULE DAILY, PLEASE TRY FOR PRIOR AUTH OR CONSIDER 40 MG CAPSULES?       Dr. Melvyn Novas, please advise.

## 2020-08-21 ENCOUNTER — Other Ambulatory Visit: Payer: Self-pay | Admitting: Adult Health

## 2020-08-23 ENCOUNTER — Telehealth: Payer: Self-pay | Admitting: Orthopaedic Surgery

## 2020-08-23 ENCOUNTER — Telehealth: Payer: Self-pay | Admitting: Physical Medicine and Rehabilitation

## 2020-08-23 NOTE — Telephone Encounter (Signed)
Patient had right hip injection on 7/ Please advise.

## 2020-08-23 NOTE — Telephone Encounter (Signed)
Patient called asked if Dr Tanda Rockers would write a Rx for (PT) for her. Patient said she would like to go to facility that offer water exercises. Patient asked if she would need a Rx for that as well? The number to contact patient is [phone]

## 2020-08-23 NOTE — Telephone Encounter (Signed)
Pt called stating she would like to have the referral for PT sent to the [facility] PT facility on [address] Since they have water exercises  [phone]

## 2020-08-23 NOTE — Telephone Encounter (Signed)
Pt called wanting to get scheduled for an injection; pt would also like to discuss if there's anything else she can do other than the injections to help  [phone]

## 2020-08-23 NOTE — Telephone Encounter (Signed)
She should f/up with Dr. Durward Fortes for [facility] of hip and ideas on other treatment.

## 2020-08-23 NOTE — Telephone Encounter (Signed)
Please advise 

## 2020-08-24 ENCOUNTER — Other Ambulatory Visit: Payer: Self-pay

## 2020-08-24 DIAGNOSIS — F250 Schizoaffective disorder, bipolar type: Secondary | ICD-10-CM | POA: Diagnosis not present

## 2020-08-24 DIAGNOSIS — F429 Obsessive-compulsive disorder, unspecified: Secondary | ICD-10-CM | POA: Diagnosis not present

## 2020-08-24 DIAGNOSIS — F411 Generalized anxiety disorder: Secondary | ICD-10-CM | POA: Diagnosis not present

## 2020-08-24 DIAGNOSIS — M1611 Unilateral primary osteoarthritis, right hip: Secondary | ICD-10-CM

## 2020-08-24 NOTE — Telephone Encounter (Signed)
Scheduled for OV with Dr. Durward Fortes.

## 2020-08-24 NOTE — Telephone Encounter (Signed)
Spoke with patient and ordered PT.

## 2020-08-24 NOTE — Telephone Encounter (Signed)
Ok for PT as outlined

## 2020-08-25 ENCOUNTER — Ambulatory Visit: Payer: Medicare Other | Admitting: Orthopaedic Surgery

## 2020-08-25 DIAGNOSIS — F315 Bipolar disorder, current episode depressed, severe, with psychotic features: Secondary | ICD-10-CM | POA: Diagnosis not present

## 2020-08-25 DIAGNOSIS — F209 Schizophrenia, unspecified: Secondary | ICD-10-CM | POA: Diagnosis not present

## 2020-08-25 DIAGNOSIS — F4310 Post-traumatic stress disorder, unspecified: Secondary | ICD-10-CM | POA: Diagnosis not present

## 2020-08-25 DIAGNOSIS — H6191 Disorder of right external ear, unspecified: Secondary | ICD-10-CM | POA: Insufficient documentation

## 2020-08-27 ENCOUNTER — Ambulatory Visit (INDEPENDENT_AMBULATORY_CARE_PROVIDER_SITE_OTHER): Payer: Medicare Other | Admitting: Otolaryngology

## 2020-09-06 ENCOUNTER — Telehealth: Payer: Self-pay | Admitting: Neurology

## 2020-09-06 ENCOUNTER — Telehealth (INDEPENDENT_AMBULATORY_CARE_PROVIDER_SITE_OTHER): Payer: Medicare Other | Admitting: Neurology

## 2020-09-06 ENCOUNTER — Other Ambulatory Visit: Payer: Self-pay

## 2020-09-06 ENCOUNTER — Encounter: Payer: Self-pay | Admitting: Neurology

## 2020-09-06 ENCOUNTER — Telehealth: Payer: Self-pay

## 2020-09-06 VITALS — Ht 69.0 in | Wt 175.0 lb

## 2020-09-06 DIAGNOSIS — G43019 Migraine without aura, intractable, without status migrainosus: Secondary | ICD-10-CM | POA: Diagnosis not present

## 2020-09-06 DIAGNOSIS — R569 Unspecified convulsions: Secondary | ICD-10-CM | POA: Diagnosis not present

## 2020-09-06 MED ORDER — ZOLMITRIPTAN 5 MG NA SOLN: NASAL | 4 refills | Status: DC

## 2020-09-06 MED ORDER — TOPIRAMATE 100 MG PO TABS: ORAL_TABLET | ORAL | 3 refills | Status: DC

## 2020-09-06 NOTE — Telephone Encounter (Signed)
Can You start prior authorizaton, see [place] note fax. Thanks
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
## 2020-09-07 ENCOUNTER — Encounter: Payer: Self-pay | Admitting: Neurology

## 2020-09-07 NOTE — Progress Notes (Addendum)
Patient's Pharm Benefit info: ID- [id number] Dies: [id number], PCN: [id number], Group: [id number]    Guadelupe Sabin Key: [id number] - PA Case ID: [id number] Need help? Call us at [phone] Outcome Approvedtoday Your request has been approved Drug ZOLMitriptan 5MG  solution Form [employer] Electronic [place] Form [phone] NCPDP) Approved 06/09/20 to 09/07/21.

## 2020-09-10 NOTE — Telephone Encounter (Signed)
HYDROcodone-acetaminophen (NORCO/VICODIN) 5-325 MG tablet Last filled 11/25/2018  ibuprofen (ADVIL,MOTRIN) 800 MG tablet  last filled 02/26/2018 to 05/22/2019  Shots are not working any more and making her gain a lot of weight  She's in a lot of pain and it's 2 weeks out for water therapy   [id number] - [place],  - [address] AT [address] Phone:  [phone]  Fax:  [phone]

## 2020-09-14 ENCOUNTER — Other Ambulatory Visit: Payer: Self-pay

## 2020-09-14 ENCOUNTER — Ambulatory Visit: Payer: Medicare Other | Attending: Orthopaedic Surgery | Admitting: Physical Therapy

## 2020-09-14 ENCOUNTER — Other Ambulatory Visit: Payer: Self-pay | Admitting: Adult Health

## 2020-09-14 DIAGNOSIS — R296 Repeated falls: Secondary | ICD-10-CM | POA: Diagnosis not present

## 2020-09-14 DIAGNOSIS — R2689 Other abnormalities of gait and mobility: Secondary | ICD-10-CM | POA: Insufficient documentation

## 2020-09-14 DIAGNOSIS — M6281 Muscle weakness (generalized): Secondary | ICD-10-CM | POA: Diagnosis not present

## 2020-09-14 DIAGNOSIS — M25551 Pain in right hip: Secondary | ICD-10-CM | POA: Insufficient documentation

## 2020-09-14 MED ORDER — IBUPROFEN 800 MG PO TABS: 800.0000 mg | ORAL_TABLET | Freq: Three times a day (TID) | ORAL | 1 refills | Status: DC | PRN
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
## 2020-09-14 NOTE — Telephone Encounter (Signed)
Left a detailed message with the information below at the pts cell number. 

## 2020-09-14 NOTE — Telephone Encounter (Signed)
I do not prescribe her narcotic pain medication. She will have to follow up with whoever does   I have sent in her prescription for Motrin

## 2020-09-14 NOTE — Patient Instructions (Signed)
Aquatic Therapy: What to Expect!  Where:  [facility]           NOTE: You will receive an automated phone message [place]           reminding you of your appointment and it will say the  [place], [place]  [address]           appointment is at the [facility] clinic.  We are  [phone]             working to fix this - just know that you will meet [place] at                pool!   How to Prepare:  Please make sure you drink 8 ounces of water about one hour prior to your pool session  A caregiver may attend if needed with the patient to help assist as needed. A caregiver can sit in the bleachers next to the pool.  Please arrive IN YOUR SUIT and 15 minutes prior to your appointment - this helps to avoid delays in starting your session.  Please make sure to attend to any toileting needs prior to entering the pool  [place] rooms for changing are located to the right of the check -in desk.  There is direct access to the pool deck form the locker room.  You can lock your belongings in a locker but you must bring you own lock.  Once on the pool deck your therapist will ask you to sign the Patient  Consent and Assignment of Benefits form  Your therapist may take your blood pressure prior to, during and after your session if indicated  We usually try and create a home exercise program based on activities we do in the pool.  Please be thinking about who might be able to assist you in the pool should you need to participate in an aquatic home exercise program at the time of discharge if you need assistance.  Some patients do not want to or do not have the ability to participate in an aquatic home program - this is not a barrier in any way to you participating in aquatic therapy as part of your current therapy plan!  After Discharge from PT, you can continue using home program at  the [facility], there is a drop-in fee for $5 ($45 a month)or for 60 years   or older $4.00 ($40 a month for seniors )    About the pool: 1. Entering the pool Your therapist will assist you; there are multiple ways to enter including stairs with railings, a walk in ramp, a roll in chair and a mechanical lift. Your therapist will determine the most appropriate way for you. 2. Water temperature is usually between 86-87 degrees 3. There may be other swimmers in the pool at the same time 4. There is availability at the pool for     Contact Info:             Appointments: Please call the [facility] if you need to cancel or reschedule an appointment. [place], [place]            All sessions are 45 minutes      [phone]

## 2020-09-16 ENCOUNTER — Ambulatory Visit: Payer: Medicare Other | Admitting: Internal Medicine

## 2020-09-16 ENCOUNTER — Ambulatory Visit: Payer: Medicare Other | Admitting: Primary Care
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
## 2020-09-23 ENCOUNTER — Ambulatory Visit: Payer: Medicare Other | Admitting: Physical Therapy

## 2020-09-23 ENCOUNTER — Telehealth: Payer: Self-pay | Admitting: Physical Therapy

## 2020-09-23 DIAGNOSIS — F209 Schizophrenia, unspecified: Secondary | ICD-10-CM | POA: Diagnosis not present

## 2020-09-23 DIAGNOSIS — F315 Bipolar disorder, current episode depressed, severe, with psychotic features: Secondary | ICD-10-CM | POA: Diagnosis not present

## 2020-09-23 DIAGNOSIS — F4310 Post-traumatic stress disorder, unspecified: Secondary | ICD-10-CM | POA: Diagnosis not present

## 2020-09-23 NOTE — Telephone Encounter (Signed)
Spoke with patient regarding missed appointment today. She stated since her last session her knee was bothering her and giving way. I discussed when her next appointment is and if she cannot make it to call and we can cancel that appointment for her, she verbalized understanding.   Rasheka Denard PT, DPT, LAT, ATC  09/23/20  5:54 PM

## 2020-09-30 ENCOUNTER — Other Ambulatory Visit: Payer: Self-pay

## 2020-09-30 ENCOUNTER — Encounter: Payer: Self-pay | Admitting: Physical Therapy

## 2020-09-30 ENCOUNTER — Ambulatory Visit: Payer: Medicare Other | Admitting: Physical Therapy

## 2020-09-30 DIAGNOSIS — R296 Repeated falls: Secondary | ICD-10-CM

## 2020-09-30 DIAGNOSIS — R2689 Other abnormalities of gait and mobility: Secondary | ICD-10-CM

## 2020-09-30 DIAGNOSIS — M25551 Pain in right hip: Secondary | ICD-10-CM

## 2020-09-30 DIAGNOSIS — M6281 Muscle weakness (generalized): Secondary | ICD-10-CM | POA: Diagnosis not present
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
## 2020-09-30 NOTE — Patient Instructions (Addendum)
\     Instead of resisted hip flexion supine with knees straight do the following   Voncille Lo, PT, [place] Certified Exercise Expert for the Aging Adult  09/30/20 9:24 AM Phone: [phone] Fax: [phone]

## 2020-10-05 ENCOUNTER — Ambulatory Visit: Payer: Medicare Other | Admitting: Physical Therapy

## 2020-10-05 ENCOUNTER — Encounter: Payer: Self-pay | Admitting: Physical Therapy

## 2020-10-05 ENCOUNTER — Other Ambulatory Visit: Payer: Self-pay

## 2020-10-05 DIAGNOSIS — F250 Schizoaffective disorder, bipolar type: Secondary | ICD-10-CM | POA: Diagnosis not present

## 2020-10-05 DIAGNOSIS — R2689 Other abnormalities of gait and mobility: Secondary | ICD-10-CM

## 2020-10-05 DIAGNOSIS — F429 Obsessive-compulsive disorder, unspecified: Secondary | ICD-10-CM | POA: Diagnosis not present

## 2020-10-05 DIAGNOSIS — M25551 Pain in right hip: Secondary | ICD-10-CM | POA: Diagnosis not present

## 2020-10-05 DIAGNOSIS — M6281 Muscle weakness (generalized): Secondary | ICD-10-CM

## 2020-10-05 DIAGNOSIS — R296 Repeated falls: Secondary | ICD-10-CM

## 2020-10-05 DIAGNOSIS — F411 Generalized anxiety disorder: Secondary | ICD-10-CM | POA: Diagnosis not present

## 2020-10-05 NOTE — Patient Instructions (Addendum)
Trigger Point Dry Needling  . What is Trigger Point Dry Needling (DN)? o DN is a physical therapy technique used to treat muscle pain and dysfunction. Specifically, DN helps deactivate muscle trigger points (muscle knots).  o A thin filiform needle is used to penetrate the skin and stimulate the underlying trigger point. The goal is for a local twitch response (LTR) to occur and for the trigger point to relax. No medication of any kind is injected during the procedure.   . What Does Trigger Point Dry Needling Feel Like?  o The procedure feels different for each individual patient. Some patients report that they do not actually feel the needle enter the skin and overall the process is not painful. Very mild bleeding may occur. However, many patients feel a deep cramping in the muscle in which the needle was inserted. This is the local twitch response.   Marland Kitchen How Will I feel after the treatment? o Soreness is normal, and the onset of soreness may not occur for a few hours. Typically this soreness does not last longer than two days.  o Bruising is uncommon, however; ice can be used to decrease any possible bruising.  o In rare cases feeling tired or nauseous after the treatment is normal. In addition, your symptoms may get worse before they get better, this period will typically not last longer than 24 hours.   . What Can I do After My Treatment? o Increase your hydration by drinking more water for the next 24 hours. o You may place ice or heat on the areas treated that have become sore, however, do not use heat on inflamed or bruised areas. Heat often brings more relief post needling. o You can continue your regular activities, but vigorous activity is not recommended initially after the treatment for 24 hours. o DN is best combined with other physical therapy such as strengthening, stretching, and other therapies.   Aquatic Therapy: What to Expect!  Where:  [facility]           NOTE: You will receive an automated phone message [place]           reminding you of your appointment and it will say the  [place], [place]  [address]           appointment is at the [place] [facility] clinic.  We are  [phone]             working to fix this - just know that you will meet [place] at                pool!   How to Prepare: . Please make sure you drink 8 ounces of water about one hour prior to your pool session . A caregiver may attend if needed with the patient to help assist as needed. A caregiver can sit in the bleachers next to the pool. . Please arrive IN YOUR SUIT and 15 minutes prior to your appointment - this helps to avoid delays in starting your session. . Please make sure to attend to any toileting needs prior to entering the pool . [facility] rooms for changing are located to the right of the check -in desk.  There is direct access to the pool deck form the locker room.  You can lock your belongings in a locker but you must bring you own lock. . Once on the pool deck your therapist will ask you  to sign the Patient  Consent and Assignment of Benefits form . Your therapist may take your blood pressure prior to, during and after your session if indicated . We usually try and create a home exercise program based on activities we do in the pool.  Please be thinking about who might be able to assist you in the pool should you need to participate in an aquatic home exercise program at the time of discharge if you need assistance.  Some patients do not want to or do not have the ability to participate in an aquatic home program - this is not a barrier in any way to you participating in aquatic therapy as part of your current therapy plan! . After Discharge from PT, you can continue using home program at  the [facility], there is a drop-in fee for $5 ($45 a month)or for 60 years  or older $4.00 ($40 a month for seniors )    About the  pool: 1. Entering the pool Your therapist will assist you; there are multiple ways to enter including stairs with railings, a walk in ramp, a roll in chair and a mechanical lift. Your therapist will determine the most appropriate way for you. 2. Water temperature is usually between 86-87 degrees 3. There may be other swimmers in the pool at the same time 4. There is availability at the pool for     Contact Info:             Appointments: Please call the [place] if you need to cancel or reschedule an appointment. [place]   [place], [place]            All sessions are 45 minutes      [place], PT, [facility] Certified Exercise Expert for the Aging Adult  10/05/20 10:05 AM Phone: [phone] Fax: [phone]
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
## 2020-10-12 ENCOUNTER — Ambulatory Visit: Payer: Medicare Other | Admitting: Primary Care

## 2020-10-12 DIAGNOSIS — Z20828 Contact with and (suspected) exposure to other viral communicable diseases: Secondary | ICD-10-CM | POA: Diagnosis not present

## 2020-10-13 ENCOUNTER — Other Ambulatory Visit: Payer: Self-pay | Admitting: Adult Health

## 2020-10-13 ENCOUNTER — Ambulatory Visit: Payer: Medicare Other | Admitting: Physical Therapy

## 2020-10-13 DIAGNOSIS — G4700 Insomnia, unspecified: Secondary | ICD-10-CM

## 2020-10-14 NOTE — Telephone Encounter (Signed)
#  20 tabs given until can be seen by PCP

## 2020-10-15 DIAGNOSIS — Z20828 Contact with and (suspected) exposure to other viral communicable diseases: Secondary | ICD-10-CM | POA: Diagnosis not present

## 2020-10-19 DIAGNOSIS — U071 COVID-19: Secondary | ICD-10-CM | POA: Diagnosis not present

## 2020-10-19 DIAGNOSIS — J029 Acute pharyngitis, unspecified: Secondary | ICD-10-CM | POA: Diagnosis not present

## 2020-10-20 ENCOUNTER — Ambulatory Visit: Payer: Medicare Other | Admitting: Physical Therapy

## 2020-10-26 ENCOUNTER — Other Ambulatory Visit: Payer: Self-pay | Admitting: Adult Health

## 2020-10-26 ENCOUNTER — Other Ambulatory Visit: Payer: Self-pay | Admitting: Internal Medicine

## 2020-10-26 DIAGNOSIS — F310 Bipolar disorder, current episode hypomanic: Secondary | ICD-10-CM

## 2020-10-26 DIAGNOSIS — R110 Nausea: Secondary | ICD-10-CM

## 2020-10-26 DIAGNOSIS — F32A Depression, unspecified: Secondary | ICD-10-CM

## 2020-10-26 DIAGNOSIS — R1012 Left upper quadrant pain: Secondary | ICD-10-CM

## 2020-10-26 NOTE — Telephone Encounter (Signed)
Can we find out if she has a psychiatrist yet?

## 2020-10-27 ENCOUNTER — Ambulatory Visit: Payer: Medicare Other | Admitting: Physical Therapy

## 2020-10-27 NOTE — Telephone Encounter (Signed)
Spoke with the pt and she stated she was seen by [facility] and thought they would refill this as they changed a few of her medications.  Patient agreed to contact the psychiatric office for refills.  Message sent to PCP.

## 2020-10-28 ENCOUNTER — Ambulatory Visit: Payer: Medicare Other | Admitting: Physical Therapy

## 2020-11-01 ENCOUNTER — Other Ambulatory Visit: Payer: Self-pay | Admitting: Adult Health

## 2020-11-02 ENCOUNTER — Encounter: Payer: Medicare Other | Admitting: Physical Therapy

## 2020-11-04 ENCOUNTER — Other Ambulatory Visit: Payer: Self-pay | Admitting: Adult Health

## 2020-11-04 DIAGNOSIS — R053 Chronic cough: Secondary | ICD-10-CM | POA: Diagnosis not present

## 2020-11-04 DIAGNOSIS — U071 COVID-19: Secondary | ICD-10-CM | POA: Diagnosis not present

## 2020-11-04 DIAGNOSIS — J40 Bronchitis, not specified as acute or chronic: Secondary | ICD-10-CM | POA: Diagnosis not present

## 2020-11-04 DIAGNOSIS — R059 Cough, unspecified: Secondary | ICD-10-CM | POA: Diagnosis not present

## 2020-11-08 ENCOUNTER — Other Ambulatory Visit: Payer: Self-pay | Admitting: Internal Medicine

## 2020-11-08 ENCOUNTER — Ambulatory Visit: Payer: Medicare Other | Admitting: Physical Therapy

## 2020-11-08 DIAGNOSIS — G4700 Insomnia, unspecified: Secondary | ICD-10-CM

## 2020-11-16 DIAGNOSIS — F411 Generalized anxiety disorder: Secondary | ICD-10-CM | POA: Diagnosis not present

## 2020-11-16 DIAGNOSIS — F250 Schizoaffective disorder, bipolar type: Secondary | ICD-10-CM | POA: Diagnosis not present

## 2020-11-16 DIAGNOSIS — F429 Obsessive-compulsive disorder, unspecified: Secondary | ICD-10-CM | POA: Diagnosis not present

## 2020-11-17 ENCOUNTER — Other Ambulatory Visit: Payer: Self-pay | Admitting: Adult Health

## 2020-11-17 DIAGNOSIS — Z760 Encounter for issue of repeat prescription: Secondary | ICD-10-CM

## 2020-11-24 ENCOUNTER — Other Ambulatory Visit: Payer: Self-pay | Admitting: Adult Health

## 2020-11-24 NOTE — Telephone Encounter (Signed)
Sent to the pharmacy by e-scribe. 

## 2020-11-30 ENCOUNTER — Ambulatory Visit: Payer: Medicare Other | Admitting: Internal Medicine

## 2020-12-02 DIAGNOSIS — Z20828 Contact with and (suspected) exposure to other viral communicable diseases: Secondary | ICD-10-CM | POA: Diagnosis not present

## 2020-12-07 ENCOUNTER — Ambulatory Visit (INDEPENDENT_AMBULATORY_CARE_PROVIDER_SITE_OTHER): Payer: Medicare Other

## 2020-12-07 ENCOUNTER — Other Ambulatory Visit: Payer: Self-pay

## 2020-12-07 DIAGNOSIS — Z0000 Encounter for general adult medical examination without abnormal findings: Secondary | ICD-10-CM

## 2020-12-07 DIAGNOSIS — Z599 Problem related to housing and economic circumstances, unspecified: Secondary | ICD-10-CM

## 2020-12-07 DIAGNOSIS — Z79899 Other long term (current) drug therapy: Secondary | ICD-10-CM
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
## 2020-12-07 NOTE — Patient Instructions (Addendum)
Molly [name] , Thank you for taking time to come for your Medicare Wellness Visit. I appreciate your ongoing commitment to your health goals. Please review the following plan we discussed and let me know if I can assist you in the future.   Screening recommendations/referrals: Colonoscopy: Done 09/21/17 Mammogram: Done 06/16/20 Recommended yearly ophthalmology/optometry visit for glaucoma screening and checkup Recommended yearly dental visit for hygiene and checkup  Vaccinations: Influenza vaccine: Declined  Pneumococcal vaccine: Declined  Tdap vaccine: done 06/26/17  Covid-19: declined and discussed  Advanced directives: Advance directive discussed with you today. Even though you declined this today please call our office should you change your mind and we can give you the proper paperwork for you to fill out.   Conditions/risks identified: Relief from pain  Next appointment: Follow up in one year for your annual wellness visit.   Preventive Care 40-64 Years, Female Preventive care refers to lifestyle choices and visits with your health care provider that can promote health and wellness. What does preventive care include?  A yearly physical exam. This is also called an annual well check.  Dental exams once or twice a year.  Routine eye exams. Ask your health care provider how often you should have your eyes checked.  Personal lifestyle choices, including:  Daily care of your teeth and gums.  Regular physical activity.  Eating a healthy diet.  Avoiding tobacco and drug use.  Limiting alcohol use.  Practicing safe sex.  Taking low-dose aspirin daily starting at age 56.  Taking vitamin and mineral supplements as recommended by your health care provider. What happens during an annual well check? The services and screenings done by your health care provider during your annual well check will depend on your age, overall health, lifestyle risk factors, and family history of  disease. Counseling  Your health care provider may ask you questions about your:  Alcohol use.  Tobacco use.  Drug use.  Emotional well-being.  Home and relationship well-being.  Sexual activity.  Eating habits.  Work and work Statistician.  Method of birth control.  Menstrual cycle.  Pregnancy history. Screening  You may have the following tests or measurements:  Height, weight, and BMI.  Blood pressure.  Lipid and cholesterol levels. These may be checked every 5 years, or more frequently if you are over 108 years old.  Skin check.  Lung cancer screening. You may have this screening every year starting at age 53 if you have a 30-pack-year history of smoking and currently smoke or have quit within the past 15 years.  Fecal occult blood test (FOBT) of the stool. You may have this test every year starting at age 42.  Flexible sigmoidoscopy or colonoscopy. You may have a sigmoidoscopy every 5 years or a colonoscopy every 10 years starting at age 75.  Hepatitis C blood test.  Hepatitis B blood test.  Sexually transmitted disease (STD) testing.  Diabetes screening. This is done by checking your blood sugar (glucose) after you have not eaten for a while (fasting). You may have this done every 1-3 years.  Mammogram. This may be done every 1-2 years. Talk to your health care provider about when you should start having regular mammograms. This may depend on whether you have a family history of breast cancer.  BRCA-related cancer screening. This may be done if you have a family history of breast, ovarian, tubal, or peritoneal cancers.  Pelvic exam and Pap test. This may be done every 3 years starting at age  21. Starting at age 30, this may be done every 5 years if you have a Pap test in combination with an HPV test.  Bone density scan. This is done to screen for osteoporosis. You may have this scan if you are at high risk for osteoporosis. Discuss your test results,  treatment options, and if necessary, the need for more tests with your health care provider. Vaccines  Your health care provider may recommend certain vaccines, such as:  Influenza vaccine. This is recommended every year.  Tetanus, diphtheria, and acellular pertussis (Tdap, Td) vaccine. You may need a Td booster every 10 years.  Zoster vaccine. You may need this after age 60.  Pneumococcal 13-valent conjugate (PCV13) vaccine. You may need this if you have certain conditions and were not previously vaccinated.  Pneumococcal polysaccharide (PPSV23) vaccine. You may need one or two doses if you smoke cigarettes or if you have certain conditions. Talk to your health care provider about which screenings and vaccines you need and how often you need them. This information is not intended to replace advice given to you by your health care provider. Make sure you discuss any questions you have with your health care provider. Document Released: 11/26/2015 Document Revised: 07/19/2016 Document Reviewed: 08/31/2015 Elsevier Interactive Patient Education  2017 Elsevier Inc.    Fall Prevention in the Home Falls can cause injuries. They can happen to people of all ages. There are many things you can do to make your home safe and to help prevent falls. What can I do on the outside of my home?  Regularly fix the edges of walkways and driveways and fix any cracks.  Remove anything that might make you trip as you walk through a door, such as a raised step or threshold.  Trim any bushes or trees on the path to your home.  Use bright outdoor lighting.  Clear any walking paths of anything that might make someone trip, such as rocks or tools.  Regularly check to see if handrails are loose or broken. Make sure that both sides of any steps have handrails.  Any raised decks and porches should have guardrails on the edges.  Have any leaves, snow, or ice cleared regularly.  Use sand or salt on walking  paths during winter.  Clean up any spills in your garage right away. This includes oil or grease spills. What can I do in the bathroom?  Use night lights.  Install grab bars by the toilet and in the tub and shower. Do not use towel bars as grab bars.  Use non-skid mats or decals in the tub or shower.  If you need to sit down in the shower, use a plastic, non-slip stool.  Keep the floor dry. Clean up any water that spills on the floor as soon as it happens.  Remove soap buildup in the tub or shower regularly.  Attach bath mats securely with double-sided non-slip rug tape.  Do not have throw rugs and other things on the floor that can make you trip. What can I do in the bedroom?  Use night lights.  Make sure that you have a light by your bed that is easy to reach.  Do not use any sheets or blankets that are too big for your bed. They should not hang down onto the floor.  Have a firm chair that has side arms. You can use this for support while you get dressed.  Do not have throw rugs and other things on   the floor that can make you trip. What can I do in the kitchen?  Clean up any spills right away.  Avoid walking on wet floors.  Keep items that you use a lot in easy-to-reach places.  If you need to reach something above you, use a strong step stool that has a grab bar.  Keep electrical cords out of the way.  Do not use floor polish or wax that makes floors slippery. If you must use wax, use non-skid floor wax.  Do not have throw rugs and other things on the floor that can make you trip. What can I do with my stairs?  Do not leave any items on the stairs.  Make sure that there are handrails on both sides of the stairs and use them. Fix handrails that are broken or loose. Make sure that handrails are as long as the stairways.  Check any carpeting to make sure that it is firmly attached to the stairs. Fix any carpet that is loose or worn.  Avoid having throw rugs at  the top or bottom of the stairs. If you do have throw rugs, attach them to the floor with carpet tape.  Make sure that you have a light switch at the top of the stairs and the bottom of the stairs. If you do not have them, ask someone to add them for you. What else can I do to help prevent falls?  Wear shoes that:  Do not have high heels.  Have rubber bottoms.  Are comfortable and fit you well.  Are closed at the toe. Do not wear sandals.  If you use a stepladder:  Make sure that it is fully opened. Do not climb a closed stepladder.  Make sure that both sides of the stepladder are locked into place.  Ask someone to hold it for you, if possible.  Clearly mark and make sure that you can see:  Any grab bars or handrails.  First and last steps.  Where the edge of each step is.  Use tools that help you move around (mobility aids) if they are needed. These include:  Canes.  Walkers.  Scooters.  Crutches.  Turn on the lights when you go into a dark area. Replace any light bulbs as soon as they burn out.  Set up your furniture so you have a clear path. Avoid moving your furniture around.  If any of your floors are uneven, fix them.  If there are any pets around you, be aware of where they are.  Review your medicines with your doctor. Some medicines can make you feel dizzy. This can increase your chance of falling. Ask your doctor what other things that you can do to help prevent falls. This information is not intended to replace advice given to you by your health care provider. Make sure you discuss any questions you have with your health care provider. Document Released: 08/26/2009 Document Revised: 04/06/2016 Document Reviewed: 12/04/2014 Elsevier Interactive Patient Education  2017 Elsevier Inc.   

## 2020-12-13 ENCOUNTER — Other Ambulatory Visit: Payer: Self-pay | Admitting: Neurology

## 2020-12-13 ENCOUNTER — Other Ambulatory Visit: Payer: Self-pay | Admitting: Adult Health

## 2020-12-13 DIAGNOSIS — F310 Bipolar disorder, current episode hypomanic: Secondary | ICD-10-CM

## 2020-12-13 DIAGNOSIS — F419 Anxiety disorder, unspecified: Secondary | ICD-10-CM

## 2020-12-13 DIAGNOSIS — F32A Depression, unspecified: Secondary | ICD-10-CM

## 2020-12-14 NOTE — Telephone Encounter (Signed)
Ok to refuse these medications. She is being seen by Psychiatry now

## 2020-12-23 DIAGNOSIS — Z20828 Contact with and (suspected) exposure to other viral communicable diseases: Secondary | ICD-10-CM | POA: Diagnosis not present

## 2021-02-09 DIAGNOSIS — F250 Schizoaffective disorder, bipolar type: Secondary | ICD-10-CM | POA: Diagnosis not present

## 2021-02-09 DIAGNOSIS — F3181 Bipolar II disorder: Secondary | ICD-10-CM | POA: Diagnosis not present

## 2021-02-09 DIAGNOSIS — F429 Obsessive-compulsive disorder, unspecified: Secondary | ICD-10-CM | POA: Diagnosis not present

## 2021-02-20 ENCOUNTER — Other Ambulatory Visit: Payer: Self-pay | Admitting: Adult Health

## 2021-02-20 DIAGNOSIS — Z760 Encounter for issue of repeat prescription: Secondary | ICD-10-CM

## 2021-02-22 ENCOUNTER — Other Ambulatory Visit: Payer: Self-pay

## 2021-02-22 DIAGNOSIS — Z760 Encounter for issue of repeat prescription: Secondary | ICD-10-CM

## 2021-02-22 MED ORDER — CYCLOBENZAPRINE HCL 10 MG PO TABS: ORAL_TABLET | ORAL | 2 refills | Status: DC

## 2021-03-02 ENCOUNTER — Other Ambulatory Visit: Payer: Self-pay | Admitting: Adult Health

## 2021-03-02 DIAGNOSIS — F209 Schizophrenia, unspecified: Secondary | ICD-10-CM | POA: Diagnosis not present

## 2021-03-02 DIAGNOSIS — F4310 Post-traumatic stress disorder, unspecified: Secondary | ICD-10-CM | POA: Diagnosis not present

## 2021-03-02 DIAGNOSIS — F315 Bipolar disorder, current episode depressed, severe, with psychotic features: Secondary | ICD-10-CM | POA: Diagnosis not present

## 2021-03-02 DIAGNOSIS — G4700 Insomnia, unspecified: Secondary | ICD-10-CM

## 2021-03-02 NOTE — Telephone Encounter (Signed)
Pt called in requesting PCP to fill this med, looks like PCP/ Assistant declined meds on 12/13/20 and 02/20/21. Pt called in directly asking PCP to fill med. Will route to PCP for review   Last filled on 11/09/20 #30 tabs with 3 refills

## 2021-03-23 ENCOUNTER — Other Ambulatory Visit: Payer: Self-pay | Admitting: Adult Health

## 2021-03-23 DIAGNOSIS — F3181 Bipolar II disorder: Secondary | ICD-10-CM | POA: Diagnosis not present

## 2021-03-23 DIAGNOSIS — F419 Anxiety disorder, unspecified: Secondary | ICD-10-CM

## 2021-03-23 DIAGNOSIS — F310 Bipolar disorder, current episode hypomanic: Secondary | ICD-10-CM

## 2021-03-23 DIAGNOSIS — F250 Schizoaffective disorder, bipolar type: Secondary | ICD-10-CM | POA: Diagnosis not present

## 2021-03-23 DIAGNOSIS — F32A Depression, unspecified: Secondary | ICD-10-CM

## 2021-03-23 DIAGNOSIS — F429 Obsessive-compulsive disorder, unspecified: Secondary | ICD-10-CM | POA: Diagnosis not present

## 2021-03-30 DIAGNOSIS — F4310 Post-traumatic stress disorder, unspecified: Secondary | ICD-10-CM | POA: Diagnosis not present

## 2021-03-30 DIAGNOSIS — F315 Bipolar disorder, current episode depressed, severe, with psychotic features: Secondary | ICD-10-CM | POA: Diagnosis not present

## 2021-03-30 DIAGNOSIS — F209 Schizophrenia, unspecified: Secondary | ICD-10-CM | POA: Diagnosis not present

## 2021-04-07 ENCOUNTER — Other Ambulatory Visit: Payer: Self-pay | Admitting: Adult Health

## 2021-04-16 ENCOUNTER — Other Ambulatory Visit: Payer: Self-pay | Admitting: Neurology

## 2021-05-09 ENCOUNTER — Telehealth (INDEPENDENT_AMBULATORY_CARE_PROVIDER_SITE_OTHER): Payer: Medicare Other | Admitting: Neurology

## 2021-05-09 ENCOUNTER — Encounter: Payer: Self-pay | Admitting: Neurology

## 2021-05-09 ENCOUNTER — Other Ambulatory Visit: Payer: Self-pay

## 2021-05-09 VITALS — Ht 69.0 in | Wt 175.0 lb

## 2021-05-09 DIAGNOSIS — G43019 Migraine without aura, intractable, without status migrainosus: Secondary | ICD-10-CM

## 2021-05-09 DIAGNOSIS — R569 Unspecified convulsions: Secondary | ICD-10-CM | POA: Diagnosis not present

## 2021-05-09 MED ORDER — AIMOVIG 140 MG/ML ~~LOC~~ SOAJ: SUBCUTANEOUS | 11 refills | Status: DC

## 2021-05-09 MED ORDER — TOPIRAMATE 100 MG PO TABS: ORAL_TABLET | ORAL | 3 refills | Status: DC

## 2021-05-09 NOTE — Patient Instructions (Signed)
Reduce Topiramate 100mg : take 1 and 1/2 tablets twice a day  Continue Aimovig  Continue follow-up with Behavioral Health  Follow-up in 6-8 months, call for any changes   Seizure Precautions: 1. If medication has been prescribed for you to prevent seizures, take it exactly as directed.  Do not stop taking the medicine without talking to your doctor first, even if you have not had a seizure in a long time.   2. Avoid activities in which a seizure would cause danger to yourself or to others.  Don't operate dangerous machinery, swim alone, or climb in high or dangerous places, such as on ladders, roofs, or girders.  Do not drive unless your doctor says you may.  3. If you have any warning that you may have a seizure, lay down in a safe place where you can't hurt yourself.    4.  No driving for 6 months from last seizure, as per [facility].   Please refer to the following link on the [place] website for more information: http://www.epilepsyfoundation.org/answerplace/Social/driving/drivingu.cfm   5.  Maintain good sleep hygiene. Avoid alcohol.  6.  Notify your neurology if you are planning pregnancy or if you become pregnant.  7.  Contact your doctor if you have any problems that may be related to the medicine you are taking.  8.  Call 911 and bring the patient back to the ED if:        A.  The seizure lasts longer than 5 minutes.       B.  The patient doesn't awaken shortly after the seizure  C.  The patient has new problems such as difficulty seeing, speaking or moving  D.  The patient was injured during the seizure  E.  The patient has a temperature over 102 F (39C)  F.  The patient vomited and now is having trouble breathing
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
## 2021-05-11 DIAGNOSIS — F3181 Bipolar II disorder: Secondary | ICD-10-CM | POA: Diagnosis not present

## 2021-05-11 DIAGNOSIS — F429 Obsessive-compulsive disorder, unspecified: Secondary | ICD-10-CM | POA: Diagnosis not present

## 2021-05-11 DIAGNOSIS — F250 Schizoaffective disorder, bipolar type: Secondary | ICD-10-CM | POA: Diagnosis not present

## 2021-05-18 DIAGNOSIS — R059 Cough, unspecified: Secondary | ICD-10-CM | POA: Diagnosis not present

## 2021-05-18 DIAGNOSIS — J029 Acute pharyngitis, unspecified: Secondary | ICD-10-CM | POA: Diagnosis not present

## 2021-05-18 DIAGNOSIS — Z20822 Contact with and (suspected) exposure to covid-19: Secondary | ICD-10-CM | POA: Diagnosis not present

## 2021-05-21 ENCOUNTER — Other Ambulatory Visit: Payer: Self-pay | Admitting: Adult Health

## 2021-05-25 DIAGNOSIS — F4310 Post-traumatic stress disorder, unspecified: Secondary | ICD-10-CM | POA: Diagnosis not present

## 2021-05-25 DIAGNOSIS — F315 Bipolar disorder, current episode depressed, severe, with psychotic features: Secondary | ICD-10-CM | POA: Diagnosis not present

## 2021-05-25 DIAGNOSIS — F209 Schizophrenia, unspecified: Secondary | ICD-10-CM | POA: Diagnosis not present

## 2021-05-27 NOTE — Progress Notes (Unsigned)
PA dismissed PA granted already.

## 2021-06-09 ENCOUNTER — Telehealth: Payer: Self-pay | Admitting: Internal Medicine

## 2021-06-09 NOTE — Telephone Encounter (Signed)
Dr Ander Slade has reviewed msg, states this can wait until tomorrow when MW returns   Forwarding to Dr Melvyn Novas

## 2021-06-09 NOTE — Telephone Encounter (Signed)
Called and spoke with pt who states she has had complaints of a cough and increased SOB x1 month now. Pt said that she is coughing up green phlegm occasionally. States that the phlegm is very watery so due to that, she first thought it was due to acid reflux so she began to take nexium.  Pt said that it hurts to take a deep breath. States when she tries to laugh, she will start to cough. Pt has coughed so much to the point that she vomits.  Asked pt if she felt like she might have a temp and she said that she believes she might as she has been breaking out in a sweat, but she does not have a thermometer that she can check her temp with.  Pt said that she was checked for both strep and covid about 2 weeks ago and both came back negative.  Pt has not had her covid vaccines.  Pt has been using her rescue inhaler at least 3 times daily.  States that she had some leftover meds including amoxicillin, penicillin, as well as prednisone that she has taken which did help ease up the symptoms but did not fully help them to go away.  Pt wants to know if there is anything that we can recommend. Pt does have an upcoming appt with MW 8/2.  Dr. Melvyn Novas, please advise.   Assessment & Plan Note by Tanda Rockers, MD at 06/17/2020 8:20 AM  Author: Tanda Rockers, MD Author Type: Physician Filed: 06/17/2020  8:21 AM  Note Status: Written Cosign: Cosign Not Required Encounter Date: 06/16/2020  Problem: Upper airway cough syndrome  Editor: Tanda Rockers, MD (Physician)               Onset age 45 - Allergy profile 04/22/2020 >  Eos 0.0 /  IgE  35 RAST  pos cedar tree - 04/22/2020 rx max for gerd/ pnds with 1st gen H1 blockers per guidelines     She is exposed to cedar trees near her house but much better on combination of gerd rx and  1st gen H1 blockers per guidelines  So no change rx needed   Pt informed of the seriousness of COVID 19 infection as a direct risk to lung health  and safey and to close contacts and  should continue to wear a facemask in public and minimize exposure to public locations but especially avoid any area or activity where non-close contacts are not observing distancing or wearing an appropriate face mask.  I strongly recommended she take either of the vaccines available through local drugstores based on updated information on millions of Americans treated with the [employer] products  which have proven both safe and  effective even against the new delta variant.       F/u in 3 m, call sooner if needed           Each maintenance medication was reviewed in detail including emphasizing most importantly the difference between maintenance and prns and under what circumstances the prns are to be triggered using an action plan format where appropriate.   Total time for H and P, chart review, counseling,   and generating customized AVS unique to this office visit / charting = 20 min

## 2021-06-10 MED ORDER — PREDNISONE 10 MG PO TABS: ORAL_TABLET | ORAL | 0 refills | Status: DC

## 2021-06-10 MED ORDER — AZITHROMYCIN 250 MG PO TABS: 250.0000 mg | ORAL_TABLET | ORAL | 0 refills | Status: DC

## 2021-06-10 NOTE — Telephone Encounter (Signed)
Spoke with the pt and notified of response per Dr Melvyn Novas  She verbalized understanding  Rxs sent to pharm  Pt to keep planned f/u

## 2021-06-10 NOTE — Telephone Encounter (Signed)
Zpak/ Prednisone 10 mg take  4 each am x 2 days,   2 each am x 2 days,  1 each am x 2 days and stop  

## 2021-06-14 ENCOUNTER — Encounter: Payer: Self-pay | Admitting: Internal Medicine

## 2021-06-14 ENCOUNTER — Ambulatory Visit (INDEPENDENT_AMBULATORY_CARE_PROVIDER_SITE_OTHER): Payer: Medicare Other | Admitting: Internal Medicine

## 2021-06-14 ENCOUNTER — Other Ambulatory Visit: Payer: Self-pay

## 2021-06-14 ENCOUNTER — Other Ambulatory Visit: Payer: Self-pay | Admitting: Internal Medicine

## 2021-06-14 DIAGNOSIS — R058 Other specified cough: Secondary | ICD-10-CM

## 2021-06-14 MED ORDER — FAMOTIDINE 20 MG PO TABS: ORAL_TABLET | ORAL | 2 refills | Status: DC

## 2021-06-14 MED ORDER — ESOMEPRAZOLE MAGNESIUM 40 MG PO CPDR: DELAYED_RELEASE_CAPSULE | ORAL | 2 refills | Status: DC

## 2021-06-14 NOTE — Patient Instructions (Addendum)
Nexium  40 mg (or omeparzole)    Take  30-60 min before first meal of the day and Pepcid (famotidine)  20 mg after supper until return to office - this is the best way to tell whether stomach acid is contributing to your problem.    GERD (REFLUX)  is an extremely common cause of respiratory symptoms just like yours , many times with no obvious heartburn at all.    It can be treated with medication, but also with lifestyle changes including elevation of the head of your bed (ideally with 6 -8inch blocks under the headboard of your bed),  Smoking cessation, avoidance of late meals, excessive alcohol, and avoid fatty foods, chocolate, peppermint, colas, red wine, and acidic juices such as orange juice.  NO MINT OR MENTHOL PRODUCTS SO NO COUGH DROPS  USE SUGARLESS CANDY INSTEAD (Jolley ranchers or Stover's or Life Savers) or even ice chips will also do - the key is to swallow to prevent all throat clearing. NO OIL BASED VITAMINS - use powdered substitutes.  Avoid fish oil when coughing.   Dr Redmond Baseman is ENT   Dr Ardis Hughs is your GI   If not happy with results I'm happy to see you again to see you but you have to bring all your medications with you including all inhalers
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
## 2021-06-15 ENCOUNTER — Encounter: Payer: Self-pay | Admitting: Internal Medicine

## 2021-06-15 NOTE — Assessment & Plan Note (Addendum)
Onset age 45  - Allergy profile 04/22/2020 >  Eos 0.0 /  IgE  35 RAST  pos [place] - 04/22/2020 rx max for gerd/ pnds with 1st gen H1 blockers per guidelines  Cough is either related to sinus dx / pnds or gerd and is being followed by both GI and ENT with no cough on activity nor sob with nl cxr post covid.  She should start by treating UACS as rec previously with max acid suppression and gerd diet/ bed blocks see avs for instructions unique to this ov    Comment: Upper airway cough syndrome (previously labeled PNDS),  is so named because it's frequently impossible to sort out how much is  CR/sinusitis with freq throat clearing (which can be related to primary GERD)   vs  causing  secondary (" extra esophageal")  GERD from wide swings in gastric pressure that occur with throat clearing, often  promoting self use of mint and menthol lozenges that reduce the lower esophageal sphincter tone and exacerbate the problem further in a cyclical fashion.   These are the same pts (now being labeled as having "irritable larynx syndrome" by some cough centers) who not infrequently have a history of having failed to tolerate ace inhibitors,  dry powder inhalers or biphosphonates or report having atypical/extraesophageal reflux symptoms that don't respond to standard doses of PPI  and are easily confused as having aecopd or asthma flares by even experienced allergists/ pulmonologists (myself included).   The standardized cough guidelines published in Chest by Lissa Morales in 2006 are still the best available and consist of a multiple step process (up to 12!) , not a single office visit,  and are intended  to address this problem logically,  with an alogrithm dependent on response to empiric treatment at  each progressive step  to determine a specific diagnosis with  minimal addtional testing needed. Therefore if adherence is an issue or can't be accurately verified,  it's very unlikely the standard evaluation and  treatment will be successful here.    Furthermore, response to therapy (other than acute cough suppression, which should only be used short term with avoidance of narcotic containing cough syrups if possible), can be a gradual process for which the patient is not likely to  perceive immediate benefit.  Unlike going to an eye doctor where the best perscription is almost always the first one and is immediately effective, this is almost never the case in the management of chronic cough syndromes. Therefore the patient needs to commit up front to consistently adhere to recommendations  for up to 6 weeks of therapy directed at the likely underlying problem(s) before the response can be reasonably evaluated.   I would be happy to work with her to control or eliminate the cough is she has done that is being asked of her by ENT/ GI providers but return with all meds in hand using a trust but verify approach to confirm accurate Medication  Reconciliation The principal here is that until we are certain that the  patients are doing what we've asked, it makes no sense to ask them to do more.   Pulmonary f/u is prn          Each maintenance medication was reviewed in detail including emphasizing most importantly the difference between maintenance and prns and under what circumstances the prns are to be triggered using an action plan format where appropriate.  Total time for H and P, chart review, counseling, reviewing  hfa device(s) and generating customized AVS unique to this office visit / same day charting = 34 min

## 2021-06-21 ENCOUNTER — Other Ambulatory Visit: Payer: Self-pay | Admitting: Adult Health

## 2021-06-21 DIAGNOSIS — Z760 Encounter for issue of repeat prescription: Secondary | ICD-10-CM

## 2021-06-24 ENCOUNTER — Other Ambulatory Visit: Payer: Self-pay | Admitting: Adult Health

## 2021-07-20 ENCOUNTER — Other Ambulatory Visit: Payer: Self-pay | Admitting: Internal Medicine

## 2021-07-20 ENCOUNTER — Telehealth: Payer: Self-pay | Admitting: Adult Health

## 2021-07-20 DIAGNOSIS — Z760 Encounter for issue of repeat prescription: Secondary | ICD-10-CM

## 2021-07-20 DIAGNOSIS — G4700 Insomnia, unspecified: Secondary | ICD-10-CM

## 2021-07-21 ENCOUNTER — Telehealth: Payer: Self-pay | Admitting: Adult Health

## 2021-07-21 MED ORDER — ZOLPIDEM TARTRATE 10 MG PO TABS
ORAL_TABLET | ORAL | 0 refills | Status: DC
Start: 2021-07-21 — End: 2021-07-25

## 2021-07-21 MED ORDER — CYCLOBENZAPRINE HCL 10 MG PO TABS: ORAL_TABLET | ORAL | 0 refills | Status: DC

## 2021-07-21 MED ORDER — IBUPROFEN 800 MG PO TABS: 800.0000 mg | ORAL_TABLET | Freq: Three times a day (TID) | ORAL | 0 refills | Status: DC | PRN

## 2021-07-21 NOTE — Telephone Encounter (Signed)
Pt has been scheduled for CPE advised to fast. No further actions needed.

## 2021-07-21 NOTE — Addendum Note (Signed)
Addended by: Gwenyth Ober R on: 07/21/2021 11:50 AM   Modules accepted: Orders

## 2021-07-21 NOTE — Telephone Encounter (Signed)
Patient called today stating that her hand brace for her carpal tunnel is broken. She says that she spoke with [facility] and was told if a prescription for a hand brace was sent in, they would fix it for her.  Patient is wondering if a prescription for a hand brace could be sent to [facility].  [facility]'s phone number is [phone].  Please advise.

## 2021-07-21 NOTE — Telephone Encounter (Signed)
Pt needs a visit for refills

## 2021-07-21 NOTE — Telephone Encounter (Signed)
PT has a apt schedule for med refills for next Wednesday Sept 14.

## 2021-07-22 ENCOUNTER — Telehealth: Payer: Self-pay

## 2021-07-22 ENCOUNTER — Other Ambulatory Visit: Payer: Self-pay | Admitting: Adult Health

## 2021-07-22 DIAGNOSIS — G4700 Insomnia, unspecified: Secondary | ICD-10-CM

## 2021-07-22 NOTE — Telephone Encounter (Signed)
Patient called stating that she did not receive her refill for zolpidem (AMBIEN) 10 MG tablet But received all other Rx patient requesting a Rx refill today pt has been out for a few days and is suffering  my migraines

## 2021-07-25 ENCOUNTER — Other Ambulatory Visit: Payer: Self-pay

## 2021-07-25 DIAGNOSIS — G4700 Insomnia, unspecified: Secondary | ICD-10-CM

## 2021-07-25 MED ORDER — ZOLPIDEM TARTRATE 10 MG PO TABS: ORAL_TABLET | ORAL | 0 refills | Status: DC

## 2021-07-25 NOTE — Telephone Encounter (Signed)
Ok for Rx for hand brace?

## 2021-07-25 NOTE — Telephone Encounter (Signed)
Spoke to pharmacist and they stated that they have not received Rx. New Rx sent to pharmacy.

## 2021-07-27 ENCOUNTER — Telehealth: Payer: Medicare Other | Admitting: Adult Health

## 2021-07-27 NOTE — Telephone Encounter (Signed)
Called [facility] to obtain fax number. Rx has been faxed to [phone].

## 2021-07-28 NOTE — Telephone Encounter (Signed)
PT called to advise she needs a refill of their:  cyclobenzaprine (FLEXERIL) 10 MG tablet Zolpidem (AMBIEN) 10 MG tablet  Please advise and send to the pharmacy as they are out of it and have not been able to get it filled for awhile.

## 2021-07-29 NOTE — Telephone Encounter (Signed)
Pharmacy stated that pt picked medication up yesterday. No further actions needed!

## 2021-08-04 ENCOUNTER — Other Ambulatory Visit: Payer: Self-pay

## 2021-08-05 ENCOUNTER — Ambulatory Visit: Payer: Medicare Other | Admitting: Adult Health

## 2021-08-16 ENCOUNTER — Ambulatory Visit: Payer: Medicare Other | Admitting: Adult Health

## 2021-08-19 ENCOUNTER — Other Ambulatory Visit: Payer: Self-pay | Admitting: Adult Health

## 2021-08-23 ENCOUNTER — Other Ambulatory Visit: Payer: Self-pay | Admitting: Adult Health

## 2021-08-23 DIAGNOSIS — Z760 Encounter for issue of repeat prescription: Secondary | ICD-10-CM

## 2021-08-28 ENCOUNTER — Other Ambulatory Visit: Payer: Self-pay | Admitting: Adult Health

## 2021-08-28 DIAGNOSIS — G4700 Insomnia, unspecified: Secondary | ICD-10-CM

## 2021-08-31 ENCOUNTER — Other Ambulatory Visit: Payer: Self-pay

## 2021-08-31 ENCOUNTER — Ambulatory Visit (INDEPENDENT_AMBULATORY_CARE_PROVIDER_SITE_OTHER): Payer: Medicare Other | Admitting: Adult Health

## 2021-08-31 ENCOUNTER — Encounter: Payer: Self-pay | Admitting: Adult Health

## 2021-08-31 VITALS — BP 132/80 | HR 105 | Temp 98.9°F | Ht 68.0 in | Wt 175.0 lb

## 2021-08-31 DIAGNOSIS — G5602 Carpal tunnel syndrome, left upper limb: Secondary | ICD-10-CM

## 2021-08-31 DIAGNOSIS — G4700 Insomnia, unspecified: Secondary | ICD-10-CM | POA: Diagnosis not present

## 2021-08-31 DIAGNOSIS — E7800 Pure hypercholesterolemia, unspecified: Secondary | ICD-10-CM

## 2021-08-31 DIAGNOSIS — F310 Bipolar disorder, current episode hypomanic: Secondary | ICD-10-CM | POA: Diagnosis not present

## 2021-08-31 DIAGNOSIS — F32A Depression, unspecified: Secondary | ICD-10-CM | POA: Diagnosis not present

## 2021-08-31 DIAGNOSIS — F419 Anxiety disorder, unspecified: Secondary | ICD-10-CM | POA: Diagnosis not present

## 2021-08-31 DIAGNOSIS — G40909 Epilepsy, unspecified, not intractable, without status epilepticus: Secondary | ICD-10-CM | POA: Diagnosis not present

## 2021-08-31 DIAGNOSIS — G43001 Migraine without aura, not intractable, with status migrainosus: Secondary | ICD-10-CM | POA: Diagnosis not present

## 2021-08-31 MED ORDER — PREDNISONE 10 MG PO TABS: ORAL_TABLET | ORAL | 0 refills | Status: DC

## 2021-08-31 MED ORDER — ZOLPIDEM TARTRATE 10 MG PO TABS: ORAL_TABLET | ORAL | 2 refills | Status: DC
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
## 2021-09-01 LAB — CBC WITH DIFFERENTIAL/PLATELET
Basophils Absolute: 0 10*3/uL (ref 0.0–0.1)
Basophils Relative: 0.6 % (ref 0.0–3.0)
Eosinophils Absolute: 0.1 10*3/uL (ref 0.0–0.7)
Eosinophils Relative: 1 % (ref 0.0–5.0)
HCT: 35.7 % — ABNORMAL LOW (ref 36.0–46.0)
Hemoglobin: 11.9 g/dL — ABNORMAL LOW (ref 12.0–15.0)
Lymphocytes Relative: 29.1 % (ref 12.0–46.0)
Lymphs Abs: 2.1 10*3/uL (ref 0.7–4.0)
MCHC: 33.4 g/dL (ref 30.0–36.0)
MCV: 91.6 fl (ref 78.0–100.0)
Monocytes Absolute: 0.5 10*3/uL (ref 0.1–1.0)
Monocytes Relative: 7.2 % (ref 3.0–12.0)
Neutro Abs: 4.5 10*3/uL (ref 1.4–7.7)
Neutrophils Relative %: 62.1 % (ref 43.0–77.0)
Platelets: 202 10*3/uL (ref 150.0–400.0)
RBC: 3.9 Mil/uL (ref 3.87–5.11)
RDW: 12.6 % (ref 11.5–15.5)
WBC: 7.2 10*3/uL (ref 4.0–10.5)

## 2021-09-01 LAB — COMPREHENSIVE METABOLIC PANEL
ALT: 59 U/L — ABNORMAL HIGH (ref 0–35)
AST: 37 U/L (ref 0–37)
Albumin: 4.5 g/dL (ref 3.5–5.2)
Alkaline Phosphatase: 87 U/L (ref 39–117)
BUN: 11 mg/dL (ref 6–23)
CO2: 30 mEq/L (ref 19–32)
Calcium: 9.6 mg/dL (ref 8.4–10.5)
Chloride: 101 mEq/L (ref 96–112)
Creatinine, Ser: 1.16 mg/dL (ref 0.40–1.20)
GFR: 57.99 mL/min — ABNORMAL LOW (ref >60.00)
Glucose, Bld: 78 mg/dL (ref 70–99)
Potassium: 3.9 mEq/L (ref 3.5–5.1)
Sodium: 139 mEq/L (ref 135–145)
Total Bilirubin: 0.5 mg/dL (ref 0.2–1.2)
Total Protein: 7.6 g/dL (ref 6.0–8.3)

## 2021-09-01 LAB — LIPID PANEL
Cholesterol: 122 mg/dL (ref 0–200)
HDL: 52.6 mg/dL (ref >39.00)
LDL Cholesterol: 49 mg/dL (ref 0–99)
NonHDL: 69.14
Total CHOL/HDL Ratio: 2
Triglycerides: 99 mg/dL (ref 0.0–149.0)
VLDL: 19.8 mg/dL (ref 0.0–40.0)

## 2021-09-01 LAB — TSH: TSH: 2.08 u[IU]/mL (ref 0.35–5.50)

## 2021-09-02 ENCOUNTER — Other Ambulatory Visit: Payer: Self-pay | Admitting: Adult Health

## 2021-09-02 DIAGNOSIS — R748 Abnormal levels of other serum enzymes: Secondary | ICD-10-CM

## 2021-09-02 MED ORDER — ROSUVASTATIN CALCIUM 20 MG PO TABS: 20.0000 mg | ORAL_TABLET | Freq: Every day | ORAL | 3 refills | Status: DC

## 2021-09-24 ENCOUNTER — Other Ambulatory Visit: Payer: Self-pay | Admitting: Adult Health

## 2021-09-24 DIAGNOSIS — Z760 Encounter for issue of repeat prescription: Secondary | ICD-10-CM

## 2021-10-05 DIAGNOSIS — R6883 Chills (without fever): Secondary | ICD-10-CM | POA: Diagnosis not present

## 2021-10-05 DIAGNOSIS — R059 Cough, unspecified: Secondary | ICD-10-CM | POA: Diagnosis not present

## 2021-10-05 DIAGNOSIS — Z8701 Personal history of pneumonia (recurrent): Secondary | ICD-10-CM | POA: Diagnosis not present

## 2021-10-05 DIAGNOSIS — R0981 Nasal congestion: Secondary | ICD-10-CM | POA: Diagnosis not present

## 2021-10-05 DIAGNOSIS — Z20822 Contact with and (suspected) exposure to covid-19: Secondary | ICD-10-CM | POA: Diagnosis not present

## 2021-10-10 ENCOUNTER — Telehealth: Payer: Self-pay | Admitting: Adult Health

## 2021-10-10 NOTE — Telephone Encounter (Signed)
Patient is requesting a prescription for an iron supplement.  Previous pill 50,000 units.  Has no energy--negative for Covid.    Pharmacy- [facility].

## 2021-10-12 NOTE — Telephone Encounter (Signed)
Pt notified of lab results from October. Pt also advised that she can pick the iron supplements up OTC. Pt declined appt. But will call back if sx persist or worsens.

## 2021-10-23 ENCOUNTER — Other Ambulatory Visit: Payer: Self-pay | Admitting: Neurology

## 2021-10-23 DIAGNOSIS — G43019 Migraine without aura, intractable, without status migrainosus: Secondary | ICD-10-CM

## 2021-10-23 DIAGNOSIS — R569 Unspecified convulsions: Secondary | ICD-10-CM

## 2021-10-31 ENCOUNTER — Other Ambulatory Visit: Payer: Self-pay | Admitting: Adult Health

## 2021-10-31 DIAGNOSIS — Z760 Encounter for issue of repeat prescription: Secondary | ICD-10-CM

## 2021-11-07 ENCOUNTER — Other Ambulatory Visit: Payer: Self-pay | Admitting: Adult Health

## 2021-11-11 ENCOUNTER — Other Ambulatory Visit: Payer: Self-pay | Admitting: Internal Medicine

## 2021-11-11 ENCOUNTER — Other Ambulatory Visit: Payer: Self-pay | Admitting: Adult Health

## 2021-11-24 ENCOUNTER — Other Ambulatory Visit: Payer: Self-pay | Admitting: Adult Health

## 2021-12-02 ENCOUNTER — Other Ambulatory Visit: Payer: Self-pay | Admitting: Adult Health

## 2021-12-05 ENCOUNTER — Ambulatory Visit: Payer: Medicare Other | Admitting: Neurology

## 2021-12-09 ENCOUNTER — Telehealth: Payer: Self-pay | Admitting: Adult Health

## 2021-12-09 ENCOUNTER — Other Ambulatory Visit: Payer: Self-pay | Admitting: Adult Health

## 2021-12-09 ENCOUNTER — Other Ambulatory Visit: Payer: Self-pay | Admitting: Neurology

## 2021-12-09 DIAGNOSIS — F315 Bipolar disorder, current episode depressed, severe, with psychotic features: Secondary | ICD-10-CM | POA: Diagnosis not present

## 2021-12-09 DIAGNOSIS — G4700 Insomnia, unspecified: Secondary | ICD-10-CM

## 2021-12-09 DIAGNOSIS — F4310 Post-traumatic stress disorder, unspecified: Secondary | ICD-10-CM | POA: Diagnosis not present

## 2021-12-09 DIAGNOSIS — F209 Schizophrenia, unspecified: Secondary | ICD-10-CM | POA: Diagnosis not present

## 2021-12-09 MED ORDER — ZOLPIDEM TARTRATE 10 MG PO TABS: ORAL_TABLET | ORAL | 2 refills | Status: DC

## 2021-12-09 NOTE — Telephone Encounter (Signed)
Patient is requesting for zolpidem (AMBIEN) 10 MG tablet [[id number]]  to be sent to her pharmacy for a refill.  Patient could be contacted at [phone].  Please advise.

## 2021-12-09 NOTE — Telephone Encounter (Signed)
Per pt this Rx is needing a PA. Pt stated she was advised of this per pharmacy this morning. I let pt now that we have not received the PA form and I didn't see anything in the electronic PA system. I did however articulate to the pt that I would be on the look out and if  I did not see anything I would call pharmacy to advise.

## 2021-12-09 NOTE — Telephone Encounter (Signed)
Per pt this Rx is needing a PA. Pt stated her pharmacy called and advised her this am. I let pt know that when I see the PA ppw bc I did not see it in fax as well as electronic database. Pt verbalized understanding.

## 2021-12-12 ENCOUNTER — Ambulatory Visit (INDEPENDENT_AMBULATORY_CARE_PROVIDER_SITE_OTHER): Payer: Medicare Other

## 2021-12-12 VITALS — Ht 69.0 in | Wt 175.0 lb

## 2021-12-12 DIAGNOSIS — Z0000 Encounter for general adult medical examination without abnormal findings: Secondary | ICD-10-CM

## 2021-12-12 DIAGNOSIS — Z1231 Encounter for screening mammogram for malignant neoplasm of breast: Secondary | ICD-10-CM

## 2021-12-12 DIAGNOSIS — F32A Depression, unspecified: Secondary | ICD-10-CM
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
## 2021-12-12 NOTE — Patient Instructions (Addendum)
Molly [name] , Thank you for taking time to come for your Medicare Wellness Visit. I appreciate your ongoing commitment to your health goals. Please review the following plan we discussed and let me know if I can assist you in the future.   Screening recommendations/referrals: Colonoscopy: 09/21/17 Mammogram: 06/16/20 repeat yearly, Order sent today Bone Density: Due at age 44 Recommended yearly ophthalmology/optometry visit for glaucoma screening and checkup Recommended yearly dental visit for hygiene and checkup  Vaccinations: Influenza vaccine: Declined Pneumococcal vaccine: Due at age 45 Tdap vaccine: 06/26/17 Shingles vaccine: Due at age 48  Covid-19: Declined  Advanced directives: Advance directive discussed with you today. I have provided a copy for you to complete at home and have notarized. Once this is complete please bring a copy in to our office so we can scan it into your chart.   Conditions/risks identified: Increase activity daily as tolerated   Next appointment: Follow up in one year for your annual wellness visit.   Preventive Care 40-64 Years, Female Preventive care refers to lifestyle choices and visits with your health care provider that can promote health and wellness. What does preventive care include? A yearly physical exam. This is also called an annual well check. Dental exams once or twice a year. Routine eye exams. Ask your health care provider how often you should have your eyes checked. Personal lifestyle choices, including: Daily care of your teeth and gums. Regular physical activity. Eating a healthy diet. Avoiding tobacco and drug use. Limiting alcohol use. Practicing safe sex. Taking low-dose aspirin daily starting at age 30. Taking vitamin and mineral supplements as recommended by your health care provider. What happens during an annual well check? The services and screenings done by your health care provider during your annual well check will  depend on your age, overall health, lifestyle risk factors, and family history of disease. Counseling  Your health care provider may ask you questions about your: Alcohol use. Tobacco use. Drug use. Emotional well-being. Home and relationship well-being. Sexual activity. Eating habits. Work and work Statistician. Method of birth control. Menstrual cycle. Pregnancy history. Screening  You may have the following tests or measurements: Height, weight, and BMI. Blood pressure. Lipid and cholesterol levels. These may be checked every 5 years, or more frequently if you are over 56 years old. Skin check. Lung cancer screening. You may have this screening every year starting at age 53 if you have a 30-pack-year history of smoking and currently smoke or have quit within the past 15 years. Fecal occult blood test (FOBT) of the stool. You may have this test every year starting at age 25. Flexible sigmoidoscopy or colonoscopy. You may have a sigmoidoscopy every 5 years or a colonoscopy every 10 years starting at age 72. Hepatitis C blood test. Hepatitis B blood test. Sexually transmitted disease (STD) testing. Diabetes screening. This is done by checking your blood sugar (glucose) after you have not eaten for a while (fasting). You may have this done every 1-3 years. Mammogram. This may be done every 1-2 years. Talk to your health care provider about when you should start having regular mammograms. This may depend on whether you have a family history of breast cancer. BRCA-related cancer screening. This may be done if you have a family history of breast, ovarian, tubal, or peritoneal cancers. Pelvic exam and Pap test. This may be done every 3 years starting at age 53. Starting at age 17, this may be done every 5 years if you have  a Pap test in combination with an HPV test. Bone density scan. This is done to screen for osteoporosis. You may have this scan if you are at high risk for  osteoporosis. Discuss your test results, treatment options, and if necessary, the need for more tests with your health care provider. Vaccines  Your health care provider may recommend certain vaccines, such as: Influenza vaccine. This is recommended every year. Tetanus, diphtheria, and acellular pertussis (Tdap, Td) vaccine. You may need a Td booster every 10 years. Zoster vaccine. You may need this after age 37. Pneumococcal 13-valent conjugate (PCV13) vaccine. You may need this if you have certain conditions and were not previously vaccinated. Pneumococcal polysaccharide (PPSV23) vaccine. You may need one or two doses if you smoke cigarettes or if you have certain conditions. Talk to your health care provider about which screenings and vaccines you need and how often you need them. This information is not intended to replace advice given to you by your health care provider. Make sure you discuss any questions you have with your health care provider. Document Released: 11/26/2015 Document Revised: 07/19/2016 Document Reviewed: 08/31/2015 Elsevier Interactive Patient Education  2017 [place] Prevention in the Home Falls can cause injuries. They can happen to people of all ages. There are many things you can do to make your home safe and to help prevent falls. What can I do on the outside of my home? Regularly fix the edges of walkways and driveways and fix any cracks. Remove anything that might make you trip as you walk through a door, such as a raised step or threshold. Trim any bushes or trees on the path to your home. Use bright outdoor lighting. Clear any walking paths of anything that might make someone trip, such as rocks or tools. Regularly check to see if handrails are loose or broken. Make sure that both sides of any steps have handrails. Any raised decks and porches should have guardrails on the edges. Have any leaves, snow, or ice cleared regularly. Use sand or  salt on walking paths during winter. Clean up any spills in your garage right away. This includes oil or grease spills. What can I do in the bathroom? Use night lights. Install grab bars by the toilet and in the tub and shower. Do not use towel bars as grab bars. Use non-skid mats or decals in the tub or shower. If you need to sit down in the shower, use a plastic, non-slip stool. Keep the floor dry. Clean up any water that spills on the floor as soon as it happens. Remove soap buildup in the tub or shower regularly. Attach bath mats securely with double-sided non-slip rug tape. Do not have throw rugs and other things on the floor that can make you trip. What can I do in the bedroom? Use night lights. Make sure that you have a light by your bed that is easy to reach. Do not use any sheets or blankets that are too big for your bed. They should not hang down onto the floor. Have a firm chair that has side arms. You can use this for support while you get dressed. Do not have throw rugs and other things on the floor that can make you trip. What can I do in the kitchen? Clean up any spills right away. Avoid walking on wet floors. Keep items that you use a lot in easy-to-reach places. If you need to reach something above you, use  a strong step stool that has a grab bar. Keep electrical cords out of the way. Do not use floor polish or wax that makes floors slippery. If you must use wax, use non-skid floor wax. Do not have throw rugs and other things on the floor that can make you trip. What can I do with my stairs? Do not leave any items on the stairs. Make sure that there are handrails on both sides of the stairs and use them. Fix handrails that are broken or loose. Make sure that handrails are as long as the stairways. Check any carpeting to make sure that it is firmly attached to the stairs. Fix any carpet that is loose or worn. Avoid having throw rugs at the top or bottom of the stairs. If  you do have throw rugs, attach them to the floor with carpet tape. Make sure that you have a light switch at the top of the stairs and the bottom of the stairs. If you do not have them, ask someone to add them for you. What else can I do to help prevent falls? Wear shoes that: Do not have high heels. Have rubber bottoms. Are comfortable and fit you well. Are closed at the toe. Do not wear sandals. If you use a stepladder: Make sure that it is fully opened. Do not climb a closed stepladder. Make sure that both sides of the stepladder are locked into place. Ask someone to hold it for you, if possible. Clearly mark and make sure that you can see: Any grab bars or handrails. First and last steps. Where the edge of each step is. Use tools that help you move around (mobility aids) if they are needed. These include: Canes. Walkers. Scooters. Crutches. Turn on the lights when you go into a dark area. Replace any light bulbs as soon as they burn out. Set up your furniture so you have a clear path. Avoid moving your furniture around. If any of your floors are uneven, fix them. If there are any pets around you, be aware of where they are. Review your medicines with your doctor. Some medicines can make you feel dizzy. This can increase your chance of falling. Ask your doctor what other things that you can do to help prevent falls. This information is not intended to replace advice given to you by your health care provider. Make sure you discuss any questions you have with your health care provider. Document Released: 08/26/2009 Document Revised: 04/06/2016 Document Reviewed: 12/04/2014 Elsevier Interactive Patient Education  2017 [employer].

## 2021-12-13 ENCOUNTER — Ambulatory Visit: Payer: Medicare Other

## 2021-12-20 ENCOUNTER — Ambulatory Visit (INDEPENDENT_AMBULATORY_CARE_PROVIDER_SITE_OTHER): Payer: Medicare Other | Admitting: Neurology

## 2021-12-20 ENCOUNTER — Encounter: Payer: Self-pay | Admitting: Neurology

## 2021-12-20 ENCOUNTER — Other Ambulatory Visit: Payer: Self-pay

## 2021-12-20 VITALS — BP 145/86 | HR 123 | Resp 18 | Ht 68.0 in | Wt 177.0 lb

## 2021-12-20 DIAGNOSIS — R569 Unspecified convulsions: Secondary | ICD-10-CM | POA: Diagnosis not present

## 2021-12-20 DIAGNOSIS — G43019 Migraine without aura, intractable, without status migrainosus: Secondary | ICD-10-CM | POA: Diagnosis not present

## 2021-12-20 DIAGNOSIS — G4700 Insomnia, unspecified: Secondary | ICD-10-CM

## 2021-12-20 MED ORDER — XCOPRI 14 X 12.5 MG & 14 X 25 MG PO TBPK: ORAL_TABLET | ORAL | 0 refills | Status: DC

## 2021-12-20 MED ORDER — XCOPRI 14 X 50 MG & 14 X100 MG PO TBPK: ORAL_TABLET | ORAL | 0 refills | Status: DC

## 2021-12-20 MED ORDER — AIMOVIG 140 MG/ML ~~LOC~~ SOAJ
SUBCUTANEOUS | 11 refills | Status: DC
Start: 2021-12-20 — End: 2022-04-28

## 2021-12-20 MED ORDER — XCOPRI 100 MG PO TABS: ORAL_TABLET | ORAL | 5 refills | Status: DC

## 2021-12-20 MED ORDER — TOPIRAMATE 100 MG PO TABS: ORAL_TABLET | ORAL | 11 refills | Status: DC

## 2021-12-20 MED ORDER — ZOLMITRIPTAN 5 MG NA SOLN
NASAL | 6 refills | Status: DC
Start: 2021-12-20 — End: 2022-03-08

## 2021-12-20 NOTE — Patient Instructions (Addendum)
Reduce Topamax (Topiramate) 100mg : take 1 and 1/2 tablet twice a day  2. Continue Aimovig every month, and Maxalt nasal spray as needed  3. Start Xcopri (Cenobamate) starter pack 12.5mg  tablet every night for 2 weeks, then 25mg  tablet every night for 2 weeks. Then start the next starter pack of 50mg  tablet every night for 2 weeks, then 100mg  every night and continue on this dose  4. Discuss blood pressure with PCP  5. Referral will be sent to sleep specialist  6. Follow-up in 4 months, call for any changes  Seizure Precautions: 1. If medication has been prescribed for you to prevent seizures, take it exactly as directed.  Do not stop taking the medicine without talking to your doctor first, even if you have not had a seizure in a long time.   2. Avoid activities in which a seizure would cause danger to yourself or to others.  Don't operate dangerous machinery, swim alone, or climb in high or dangerous places, such as on ladders, roofs, or girders.  Do not drive unless your doctor says you may.  3. If you have any warning that you may have a seizure, lay down in a safe place where you can't hurt yourself.    4.  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   Please refer to the following link on the [place] website for more information: http://[email or web address]/[email or web address]/driving/drivingu.cfm   5.  Maintain good sleep hygiene. Avoid alcohol.  6.  Notify your neurology if you are planning pregnancy or if you become pregnant.  7.  Contact your doctor if you have any problems that may be related to the medicine you are taking.  8.  Call 911 and bring the patient back to the ED if:        A.  The seizure lasts longer than 5 minutes.       B.  The patient doesn't awaken shortly after the seizure  C.  The patient has new problems such as difficulty seeing, speaking or moving  D.  The patient was injured during the  seizure  E.  The patient has a temperature over 102 F (39C)  F.  The patient vomited and now is having trouble breathing
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
## 2021-12-21 ENCOUNTER — Telehealth: Payer: Self-pay | Admitting: Adult Health

## 2021-12-21 ENCOUNTER — Telehealth: Payer: Self-pay

## 2021-12-21 NOTE — Telephone Encounter (Signed)
Patient came into  the office and wants to schedule an apt with Dr, Ernestina Patches   Please advise patient has been seen in the past

## 2021-12-21 NOTE — Telephone Encounter (Signed)
Pt called and states that the injections stopped working for her so she would like to come in an consults about what the next steps are   CB [phone]

## 2021-12-21 NOTE — Telephone Encounter (Signed)
Please advise 

## 2021-12-21 NOTE — Telephone Encounter (Signed)
Per pt request pt has been scheduled for a video visit 12/28/2021

## 2021-12-21 NOTE — Telephone Encounter (Signed)
Pt call and stated she need a new RX for a Coppertone wrist braces for her left and right sent to [facility] fax # is [phone] also stated they need the office note sent with it.

## 2021-12-28 ENCOUNTER — Telehealth (INDEPENDENT_AMBULATORY_CARE_PROVIDER_SITE_OTHER): Payer: Medicare Other | Admitting: Adult Health

## 2021-12-28 ENCOUNTER — Encounter: Payer: Self-pay | Admitting: Adult Health

## 2021-12-28 VITALS — Ht 68.0 in | Wt 177.0 lb

## 2021-12-28 DIAGNOSIS — G5603 Carpal tunnel syndrome, bilateral upper limbs: Secondary | ICD-10-CM | POA: Diagnosis not present

## 2021-12-28 DIAGNOSIS — R053 Chronic cough: Secondary | ICD-10-CM | POA: Diagnosis not present
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
## 2021-12-29 DIAGNOSIS — R059 Cough, unspecified: Secondary | ICD-10-CM | POA: Insufficient documentation

## 2021-12-29 DIAGNOSIS — Z8616 Personal history of COVID-19: Secondary | ICD-10-CM | POA: Diagnosis not present

## 2022-01-02 ENCOUNTER — Ambulatory Visit (INDEPENDENT_AMBULATORY_CARE_PROVIDER_SITE_OTHER): Payer: Medicare Other | Admitting: Clinical

## 2022-01-02 ENCOUNTER — Other Ambulatory Visit: Payer: Self-pay

## 2022-01-02 ENCOUNTER — Ambulatory Visit: Payer: Medicare Other

## 2022-01-02 DIAGNOSIS — F4310 Post-traumatic stress disorder, unspecified: Secondary | ICD-10-CM

## 2022-01-02 DIAGNOSIS — F39 Unspecified mood [affective] disorder: Secondary | ICD-10-CM | POA: Diagnosis not present

## 2022-01-02 DIAGNOSIS — F209 Schizophrenia, unspecified: Secondary | ICD-10-CM

## 2022-01-02 NOTE — Plan of Care (Signed)
Pt will develop and implement healthy coping strategies for mood management as evidenced by completing positive journaling 2/7 days per week, minimum of 1 entry on each day. Pt participated in completion of treatment plan.
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
## 2022-01-04 ENCOUNTER — Other Ambulatory Visit: Payer: Self-pay | Admitting: Neurology

## 2022-01-04 DIAGNOSIS — M26609 Unspecified temporomandibular joint disorder, unspecified side: Secondary | ICD-10-CM | POA: Diagnosis not present

## 2022-01-04 DIAGNOSIS — R569 Unspecified convulsions: Secondary | ICD-10-CM

## 2022-01-04 DIAGNOSIS — G43019 Migraine without aura, intractable, without status migrainosus: Secondary | ICD-10-CM

## 2022-01-06 ENCOUNTER — Ambulatory Visit: Payer: Medicare Other

## 2022-01-11 ENCOUNTER — Ambulatory Visit: Payer: Medicare Other

## 2022-01-11 ENCOUNTER — Other Ambulatory Visit: Payer: Self-pay

## 2022-01-11 ENCOUNTER — Encounter: Payer: Self-pay | Admitting: Orthopaedic Surgery

## 2022-01-11 ENCOUNTER — Ambulatory Visit (INDEPENDENT_AMBULATORY_CARE_PROVIDER_SITE_OTHER): Payer: Medicare Other | Admitting: Orthopaedic Surgery

## 2022-01-11 VITALS — Ht 69.0 in | Wt 176.0 lb

## 2022-01-11 DIAGNOSIS — M25551 Pain in right hip: Secondary | ICD-10-CM | POA: Diagnosis not present
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
## 2022-01-12 ENCOUNTER — Telehealth: Payer: Self-pay | Admitting: Adult Health

## 2022-01-12 NOTE — Telephone Encounter (Signed)
Noted  

## 2022-01-12 NOTE — Telephone Encounter (Signed)
Handicap Placard form to be filled out--placed in dr's folder.  Mail to address on form upon completion. ?

## 2022-01-13 ENCOUNTER — Ambulatory Visit
Admission: RE | Admit: 2022-01-13 | Discharge: 2022-01-13 | Disposition: A | Discharge status: Home / Self Care | Payer: Medicare Other | Source: Ambulatory Visit | Attending: Adult Health | Admitting: Adult Health

## 2022-01-13 ENCOUNTER — Other Ambulatory Visit: Payer: Self-pay

## 2022-01-13 ENCOUNTER — Ambulatory Visit: Payer: Medicare Other

## 2022-01-13 DIAGNOSIS — Z1231 Encounter for screening mammogram for malignant neoplasm of breast: Secondary | ICD-10-CM | POA: Diagnosis not present

## 2022-01-13 NOTE — Telephone Encounter (Signed)
Handicap placard filled out. Pt notified of update. Sending it to the mail now. Copy will be placed on file. ?

## 2022-01-17 ENCOUNTER — Other Ambulatory Visit: Payer: Self-pay

## 2022-01-17 ENCOUNTER — Ambulatory Visit (INDEPENDENT_AMBULATORY_CARE_PROVIDER_SITE_OTHER): Payer: Medicare Other | Admitting: Podiatry

## 2022-01-17 ENCOUNTER — Ambulatory Visit: Payer: Medicare Other | Admitting: Physical Therapy

## 2022-01-17 DIAGNOSIS — M778 Other enthesopathies, not elsewhere classified: Secondary | ICD-10-CM

## 2022-01-17 DIAGNOSIS — M25879 Other specified joint disorders, unspecified ankle and foot: Secondary | ICD-10-CM

## 2022-01-17 MED ORDER — DEXAMETHASONE SODIUM PHOSPHATE 120 MG/30ML IJ SOLN
4.0000 mg | Freq: Once | INTRAMUSCULAR | Status: AC
  Administered 2022-01-17: 4 mg via INTRA_ARTICULAR

## 2022-01-17 NOTE — Progress Notes (Signed)
She presents today chief concern of pain beneath the first metatarsophalangeal joints bilaterally states that she thinks it may be a pair of Heels that she wore.  Had similar findings February of last year.  States that she has radiating pain that shoots up into the toe ? ?Objective: Vital signs are stable she is alert and oriented x3.  Pulses are palpable.  She has pain on palpation to the tibial sesamoid. ? ?Assessment: Sesamoiditis. ? ?Plan: Injected dexamethasone local anesthetic to the sesamoidal apparatus ?

## 2022-01-17 NOTE — Addendum Note (Signed)
Addended by: Clovis Riley E on: 01/17/2022 01:26 PM ? ? Modules accepted: Orders ? ?

## 2022-01-19 ENCOUNTER — Ambulatory Visit: Payer: Self-pay

## 2022-01-19 ENCOUNTER — Ambulatory Visit (INDEPENDENT_AMBULATORY_CARE_PROVIDER_SITE_OTHER): Payer: Medicare Other

## 2022-01-19 ENCOUNTER — Other Ambulatory Visit: Payer: Self-pay

## 2022-01-19 ENCOUNTER — Ambulatory Visit (INDEPENDENT_AMBULATORY_CARE_PROVIDER_SITE_OTHER): Payer: Medicare Other | Admitting: Orthopaedic Surgery

## 2022-01-19 ENCOUNTER — Encounter: Payer: Self-pay | Admitting: Orthopaedic Surgery

## 2022-01-19 DIAGNOSIS — G8929 Other chronic pain: Secondary | ICD-10-CM

## 2022-01-19 DIAGNOSIS — M25562 Pain in left knee: Secondary | ICD-10-CM | POA: Diagnosis not present

## 2022-01-19 DIAGNOSIS — M25561 Pain in right knee: Secondary | ICD-10-CM | POA: Diagnosis not present
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
## 2022-01-25 ENCOUNTER — Telehealth: Payer: Self-pay | Admitting: Orthopaedic Surgery

## 2022-01-25 ENCOUNTER — Ambulatory Visit: Payer: Medicare Other | Attending: Orthopaedic Surgery

## 2022-01-25 ENCOUNTER — Other Ambulatory Visit: Payer: Self-pay

## 2022-01-25 DIAGNOSIS — G8929 Other chronic pain: Secondary | ICD-10-CM

## 2022-01-25 DIAGNOSIS — M25562 Pain in left knee: Secondary | ICD-10-CM | POA: Insufficient documentation

## 2022-01-25 DIAGNOSIS — R2689 Other abnormalities of gait and mobility: Secondary | ICD-10-CM | POA: Diagnosis not present

## 2022-01-25 DIAGNOSIS — M25561 Pain in right knee: Secondary | ICD-10-CM | POA: Insufficient documentation

## 2022-01-25 DIAGNOSIS — R262 Difficulty in walking, not elsewhere classified: Secondary | ICD-10-CM | POA: Diagnosis not present

## 2022-01-25 DIAGNOSIS — R296 Repeated falls: Secondary | ICD-10-CM | POA: Insufficient documentation

## 2022-01-25 DIAGNOSIS — M6281 Muscle weakness (generalized): Secondary | ICD-10-CM | POA: Diagnosis not present

## 2022-01-25 DIAGNOSIS — M25551 Pain in right hip: Secondary | ICD-10-CM | POA: Insufficient documentation

## 2022-01-25 NOTE — Telephone Encounter (Signed)
[facility] --states that they cant order the tens unit & order for them to look at hips. ? ?Please call the patient to discuss [phone] ?

## 2022-01-25 NOTE — Telephone Encounter (Signed)
Tried to call patient. No answer. LMOM asking for more information for clarification.  ?

## 2022-01-25 NOTE — Telephone Encounter (Signed)
Patient is requesting for the paperwork to faxed over to her at [phone] because she never received it in the mail. ? ?Please advise. ?

## 2022-01-25 NOTE — Telephone Encounter (Signed)
Pt called Molly [name] back. She states she think in medical supply store. She said she is unsure if [facility] has them. Also she need Molly to call back pertaining to another script for her hips. Please call pt at [phone]. ?

## 2022-01-26 ENCOUNTER — Other Ambulatory Visit: Payer: Self-pay

## 2022-01-26 NOTE — Telephone Encounter (Signed)
Form faxed to [phone]. Form has also been mailed again and a copy has been placed in the front office filing cabinet. Pt advised of update.  ?

## 2022-01-26 NOTE — Telephone Encounter (Signed)
PT for LE strengthening including hips

## 2022-01-26 NOTE — Telephone Encounter (Signed)
Order placed and patient aware.

## 2022-01-26 NOTE — Telephone Encounter (Signed)
Spoke with patient. TENS unit has been ordered through [facility] on [address]. ?Will ask Dr.Whitfield about PT on hips today and place order if he agrees.  ?

## 2022-01-27 ENCOUNTER — Other Ambulatory Visit: Payer: Self-pay

## 2022-01-27 ENCOUNTER — Ambulatory Visit (INDEPENDENT_AMBULATORY_CARE_PROVIDER_SITE_OTHER): Payer: Medicare Other | Admitting: Primary Care

## 2022-01-27 ENCOUNTER — Encounter: Payer: Self-pay | Admitting: Primary Care

## 2022-01-27 VITALS — BP 130/78 | HR 107 | Ht 69.0 in | Wt 176.0 lb

## 2022-01-27 DIAGNOSIS — G4700 Insomnia, unspecified: Secondary | ICD-10-CM | POA: Diagnosis not present

## 2022-01-27 DIAGNOSIS — R0683 Snoring: Secondary | ICD-10-CM | POA: Diagnosis not present

## 2022-01-27 NOTE — Patient Instructions (Signed)
?  Sleep apnea is defined as period of 10 seconds or longer when you stop breathing at night. This can happen multiple times a night. Dx sleep apnea is when this occurs more than 5 times an hour.  ?  ?Mild OSA 5-15 apneic events an hour ?Moderate OSA 15-30 apneic events an hour ?Severe OSA > 30 apneic events an hour ?  ?Untreated sleep apnea puts you at higher risk for cardiac arrhythmias, pulmonary HTN, stroke and diabetes ?  ?Treatment options include weight loss, side sleeping position, oral appliance, CPAP therapy or referral to ENT for possible surgical options  ?  ?Recommendations: ?Focus on side sleeping position ?Do not drive if experiencing excessive daytime sleepiness of fatigue  ?  ?Orders: ?In lab sleep study re: snoring/ insomnia  ?  ?Follow-up: ?4-6 week visit with Molly [name] to review sleep study results and discuss treatment options further ?Also need follow-up with Molly [name] for cough  ?
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
## 2022-01-28 DIAGNOSIS — Z20822 Contact with and (suspected) exposure to covid-19: Secondary | ICD-10-CM | POA: Diagnosis not present

## 2022-01-28 DIAGNOSIS — J40 Bronchitis, not specified as acute or chronic: Secondary | ICD-10-CM | POA: Diagnosis not present

## 2022-01-28 DIAGNOSIS — R059 Cough, unspecified: Secondary | ICD-10-CM | POA: Diagnosis not present

## 2022-01-30 ENCOUNTER — Ambulatory Visit (HOSPITAL_COMMUNITY): Payer: Medicare Other | Admitting: Clinical

## 2022-01-30 ENCOUNTER — Telehealth (HOSPITAL_COMMUNITY): Payer: Self-pay | Admitting: Clinical

## 2022-01-30 ENCOUNTER — Other Ambulatory Visit: Payer: Self-pay

## 2022-01-30 DIAGNOSIS — R0683 Snoring: Secondary | ICD-10-CM | POA: Insufficient documentation

## 2022-01-30 NOTE — Telephone Encounter (Signed)
error 

## 2022-01-30 NOTE — Assessment & Plan Note (Signed)
Patient has symptoms of loud snoring, restless sleep, insomnia, nightmares, restless leg symptoms . Ep[place] 2. Concern patient could have obstructive sleep apnea, needs in-lab sleep study to evaluate for OSA and RLS. Discussed risk of untreated sleep apnea including cardiac arrhythmias, pulm HTN, stroke, DM. We briefly reviewed treatment options. Encouraged patient to work on weight loss efforts and focus on side sleeping position/elevate head of bed. Advised against driving if experiencing excessive daytime sleepiness. Follow-up in 4-6 weeks to review sleep study results and discuss treatment options further. ?

## 2022-01-30 NOTE — Progress Notes (Signed)
Reviewed and agree with assessment/plan. ? ? ?Chesley Mires, MD ?[place] ?01/30/2022, 8:32 AM ?Pager:  [phone] ? ?

## 2022-02-01 DIAGNOSIS — F315 Bipolar disorder, current episode depressed, severe, with psychotic features: Secondary | ICD-10-CM | POA: Diagnosis not present

## 2022-02-01 DIAGNOSIS — F4310 Post-traumatic stress disorder, unspecified: Secondary | ICD-10-CM | POA: Diagnosis not present

## 2022-02-01 DIAGNOSIS — F209 Schizophrenia, unspecified: Secondary | ICD-10-CM | POA: Diagnosis not present

## 2022-02-02 ENCOUNTER — Ambulatory Visit (HOSPITAL_BASED_OUTPATIENT_CLINIC_OR_DEPARTMENT_OTHER): Payer: Medicare Other | Attending: Orthopaedic Surgery | Admitting: Physical Therapy

## 2022-02-02 DIAGNOSIS — R262 Difficulty in walking, not elsewhere classified: Secondary | ICD-10-CM | POA: Insufficient documentation

## 2022-02-02 DIAGNOSIS — M25562 Pain in left knee: Secondary | ICD-10-CM | POA: Insufficient documentation

## 2022-02-02 DIAGNOSIS — M6281 Muscle weakness (generalized): Secondary | ICD-10-CM | POA: Insufficient documentation

## 2022-02-02 DIAGNOSIS — M25561 Pain in right knee: Secondary | ICD-10-CM | POA: Insufficient documentation

## 2022-02-02 DIAGNOSIS — G8929 Other chronic pain: Secondary | ICD-10-CM | POA: Insufficient documentation

## 2022-02-03 ENCOUNTER — Other Ambulatory Visit: Payer: Self-pay | Admitting: Neurology

## 2022-02-03 DIAGNOSIS — R569 Unspecified convulsions: Secondary | ICD-10-CM

## 2022-02-03 DIAGNOSIS — G43019 Migraine without aura, intractable, without status migrainosus: Secondary | ICD-10-CM
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
## 2022-02-08 ENCOUNTER — Ambulatory Visit: Payer: Medicare Other | Admitting: Physical Therapy

## 2022-02-08 ENCOUNTER — Encounter: Payer: Self-pay | Admitting: Physical Therapy

## 2022-02-08 DIAGNOSIS — M6281 Muscle weakness (generalized): Secondary | ICD-10-CM | POA: Diagnosis not present

## 2022-02-08 DIAGNOSIS — M25551 Pain in right hip: Secondary | ICD-10-CM | POA: Diagnosis not present

## 2022-02-08 DIAGNOSIS — G8929 Other chronic pain: Secondary | ICD-10-CM | POA: Diagnosis not present

## 2022-02-08 DIAGNOSIS — R262 Difficulty in walking, not elsewhere classified: Secondary | ICD-10-CM

## 2022-02-08 DIAGNOSIS — R296 Repeated falls: Secondary | ICD-10-CM

## 2022-02-08 DIAGNOSIS — M25562 Pain in left knee: Secondary | ICD-10-CM | POA: Diagnosis not present

## 2022-02-08 DIAGNOSIS — M25561 Pain in right knee: Secondary | ICD-10-CM | POA: Diagnosis not present

## 2022-02-08 DIAGNOSIS — R2689 Other abnormalities of gait and mobility: Secondary | ICD-10-CM

## 2022-02-10 ENCOUNTER — Ambulatory Visit: Payer: Medicare Other
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
## 2022-02-15 ENCOUNTER — Encounter: Payer: Self-pay | Admitting: Physical Therapy

## 2022-02-15 ENCOUNTER — Ambulatory Visit: Payer: Medicare Other | Attending: Orthopaedic Surgery | Admitting: Physical Therapy

## 2022-02-15 DIAGNOSIS — R296 Repeated falls: Secondary | ICD-10-CM | POA: Diagnosis not present

## 2022-02-15 DIAGNOSIS — M25551 Pain in right hip: Secondary | ICD-10-CM

## 2022-02-15 DIAGNOSIS — M6281 Muscle weakness (generalized): Secondary | ICD-10-CM | POA: Diagnosis not present

## 2022-02-15 DIAGNOSIS — R2689 Other abnormalities of gait and mobility: Secondary | ICD-10-CM | POA: Diagnosis not present

## 2022-02-15 DIAGNOSIS — M25561 Pain in right knee: Secondary | ICD-10-CM | POA: Diagnosis not present

## 2022-02-15 DIAGNOSIS — R262 Difficulty in walking, not elsewhere classified: Secondary | ICD-10-CM | POA: Diagnosis not present

## 2022-02-15 DIAGNOSIS — M25562 Pain in left knee: Secondary | ICD-10-CM | POA: Diagnosis not present

## 2022-02-15 DIAGNOSIS — G8929 Other chronic pain: Secondary | ICD-10-CM | POA: Diagnosis not present
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
## 2022-02-16 ENCOUNTER — Ambulatory Visit: Payer: Medicare Other
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
## 2022-02-24 ENCOUNTER — Ambulatory Visit: Payer: Medicare Other

## 2022-02-24 DIAGNOSIS — M25562 Pain in left knee: Secondary | ICD-10-CM | POA: Diagnosis not present

## 2022-02-24 DIAGNOSIS — G8929 Other chronic pain: Secondary | ICD-10-CM

## 2022-02-24 DIAGNOSIS — R262 Difficulty in walking, not elsewhere classified: Secondary | ICD-10-CM | POA: Diagnosis not present

## 2022-02-24 DIAGNOSIS — M25561 Pain in right knee: Secondary | ICD-10-CM | POA: Diagnosis not present

## 2022-02-24 DIAGNOSIS — M6281 Muscle weakness (generalized): Secondary | ICD-10-CM | POA: Diagnosis not present

## 2022-02-24 DIAGNOSIS — M25551 Pain in right hip: Secondary | ICD-10-CM | POA: Diagnosis not present
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
## 2022-03-01 ENCOUNTER — Ambulatory Visit: Payer: Medicare Other

## 2022-03-02 ENCOUNTER — Telehealth: Payer: Self-pay | Admitting: Adult Health

## 2022-03-02 NOTE — Telephone Encounter (Signed)
Patient called because she still has cough and is wanting a prednisone pack to be sent in. Patient states that she has seen an ENT and went to the UC and tested negative for flu, covid, etc but she still has cough. States that when she inhales she begins coughing. The only thing that helps is the prednisone pack. ? ? ? ? ?Please send to ? ? ?[id number] [place], [place] - [address]:  [phone] ?Fax:  [phone] ?  ? ? ? ? ?Please advise  ?

## 2022-03-03 NOTE — Telephone Encounter (Signed)
Left message to return phone call.

## 2022-03-03 NOTE — Telephone Encounter (Signed)
Please advise 

## 2022-03-07 ENCOUNTER — Other Ambulatory Visit: Payer: Self-pay | Admitting: Neurology

## 2022-03-07 ENCOUNTER — Other Ambulatory Visit: Payer: Self-pay | Admitting: Adult Health

## 2022-03-07 ENCOUNTER — Encounter (HOSPITAL_BASED_OUTPATIENT_CLINIC_OR_DEPARTMENT_OTHER): Payer: Medicare Other | Admitting: Pulmonary Disease

## 2022-03-07 ENCOUNTER — Other Ambulatory Visit: Payer: Self-pay | Admitting: Internal Medicine

## 2022-03-07 DIAGNOSIS — Z760 Encounter for issue of repeat prescription: Secondary | ICD-10-CM

## 2022-03-07 NOTE — Telephone Encounter (Signed)
Okay for refill?  

## 2022-03-07 NOTE — Telephone Encounter (Signed)
Called pt but it went to vm. ?

## 2022-03-08 ENCOUNTER — Other Ambulatory Visit (HOSPITAL_COMMUNITY): Payer: Self-pay

## 2022-03-09 ENCOUNTER — Ambulatory Visit: Payer: Medicare Other
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
## 2022-03-23 ENCOUNTER — Other Ambulatory Visit: Payer: Self-pay | Admitting: Adult Health

## 2022-03-23 DIAGNOSIS — M25562 Pain in left knee: Secondary | ICD-10-CM | POA: Diagnosis not present

## 2022-03-23 DIAGNOSIS — G4700 Insomnia, unspecified: Secondary | ICD-10-CM

## 2022-03-23 NOTE — Telephone Encounter (Signed)
Okay for refill? 08/2021 ? ? ?LOV ? ?zolpidem (AMBIEN) 10 MG tablet 30 tablet 2 12/09/2021   ?Sig:   TAKE 1 TABLET BY MOUTH EVERY DAY AT BEDTIME AS NEEDED      ?Route:   (none)      ?Note to Pharmacy:   DX code G47.50      ? ?

## 2022-03-30 DIAGNOSIS — F315 Bipolar disorder, current episode depressed, severe, with psychotic features: Secondary | ICD-10-CM | POA: Diagnosis not present

## 2022-03-30 DIAGNOSIS — F209 Schizophrenia, unspecified: Secondary | ICD-10-CM | POA: Diagnosis not present

## 2022-03-30 DIAGNOSIS — F4310 Post-traumatic stress disorder, unspecified: Secondary | ICD-10-CM | POA: Diagnosis not present

## 2022-04-27 ENCOUNTER — Telehealth: Payer: Self-pay

## 2022-04-28 ENCOUNTER — Encounter: Payer: Self-pay | Admitting: Neurology

## 2022-04-28 ENCOUNTER — Telehealth (INDEPENDENT_AMBULATORY_CARE_PROVIDER_SITE_OTHER): Payer: Medicare Other | Admitting: Neurology

## 2022-04-28 DIAGNOSIS — R569 Unspecified convulsions: Secondary | ICD-10-CM | POA: Diagnosis not present

## 2022-04-28 DIAGNOSIS — G43019 Migraine without aura, intractable, without status migrainosus: Secondary | ICD-10-CM

## 2022-04-28 MED ORDER — CARBAMAZEPINE 200 MG PO TABS: 200.0000 mg | ORAL_TABLET | Freq: Three times a day (TID) | ORAL | 11 refills | Status: DC

## 2022-04-28 MED ORDER — TOPIRAMATE 200 MG PO TABS: ORAL_TABLET | ORAL | 3 refills | Status: DC

## 2022-04-28 MED ORDER — AIMOVIG 140 MG/ML ~~LOC~~ SOAJ: SUBCUTANEOUS | 11 refills | Status: DC

## 2022-04-28 MED ORDER — SUMATRIPTAN 20 MG/ACT NA SOLN: NASAL | 6 refills | Status: DC
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
## 2022-05-03 ENCOUNTER — Encounter: Payer: Self-pay | Admitting: Orthopaedic Surgery

## 2022-05-03 ENCOUNTER — Other Ambulatory Visit: Payer: Self-pay | Admitting: Physician Assistant

## 2022-05-03 ENCOUNTER — Ambulatory Visit (INDEPENDENT_AMBULATORY_CARE_PROVIDER_SITE_OTHER): Payer: Medicare Other | Admitting: Orthopaedic Surgery

## 2022-05-03 DIAGNOSIS — M25562 Pain in left knee: Secondary | ICD-10-CM | POA: Diagnosis not present

## 2022-05-03 DIAGNOSIS — G8929 Other chronic pain: Secondary | ICD-10-CM | POA: Diagnosis not present

## 2022-05-03 DIAGNOSIS — M1612 Unilateral primary osteoarthritis, left hip: Secondary | ICD-10-CM | POA: Diagnosis not present

## 2022-05-03 DIAGNOSIS — M1611 Unilateral primary osteoarthritis, right hip: Secondary | ICD-10-CM

## 2022-05-03 DIAGNOSIS — M25561 Pain in right knee: Secondary | ICD-10-CM | POA: Diagnosis not present
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
## 2022-05-05 ENCOUNTER — Inpatient Hospital Stay: Admission: RE | Admit: 2022-05-05 | Payer: Medicare Other | Source: Ambulatory Visit

## 2022-05-07 ENCOUNTER — Other Ambulatory Visit: Payer: Self-pay | Admitting: Adult Health

## 2022-05-08 ENCOUNTER — Other Ambulatory Visit: Payer: Self-pay | Admitting: Adult Health

## 2022-05-08 ENCOUNTER — Other Ambulatory Visit: Payer: Self-pay | Admitting: Neurology

## 2022-05-09 ENCOUNTER — Telehealth: Payer: Self-pay | Admitting: Orthopaedic Surgery

## 2022-05-09 DIAGNOSIS — G8929 Other chronic pain: Secondary | ICD-10-CM

## 2022-05-09 DIAGNOSIS — M1611 Unilateral primary osteoarthritis, right hip: Secondary | ICD-10-CM

## 2022-05-09 DIAGNOSIS — M1612 Unilateral primary osteoarthritis, left hip: Secondary | ICD-10-CM

## 2022-05-09 NOTE — Telephone Encounter (Signed)
Contacted patient and referral for bilateral hip injections have been placed for [facility] and referral for physical therapy [address] location for evaluation of bilateral hip and bilateral knee.
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
## 2022-05-13 ENCOUNTER — Other Ambulatory Visit: Payer: Self-pay

## 2022-05-13 ENCOUNTER — Ambulatory Visit: Payer: Medicare Other | Attending: Orthopaedic Surgery

## 2022-05-13 DIAGNOSIS — R296 Repeated falls: Secondary | ICD-10-CM | POA: Diagnosis not present

## 2022-05-13 DIAGNOSIS — M25552 Pain in left hip: Secondary | ICD-10-CM | POA: Insufficient documentation

## 2022-05-13 DIAGNOSIS — G8929 Other chronic pain: Secondary | ICD-10-CM | POA: Insufficient documentation

## 2022-05-13 DIAGNOSIS — M25562 Pain in left knee: Secondary | ICD-10-CM | POA: Insufficient documentation

## 2022-05-13 DIAGNOSIS — M25561 Pain in right knee: Secondary | ICD-10-CM | POA: Insufficient documentation

## 2022-05-13 DIAGNOSIS — R2689 Other abnormalities of gait and mobility: Secondary | ICD-10-CM | POA: Diagnosis not present

## 2022-05-13 DIAGNOSIS — M6281 Muscle weakness (generalized): Secondary | ICD-10-CM | POA: Diagnosis not present

## 2022-05-13 DIAGNOSIS — R262 Difficulty in walking, not elsewhere classified: Secondary | ICD-10-CM | POA: Diagnosis not present

## 2022-05-13 DIAGNOSIS — M25551 Pain in right hip: Secondary | ICD-10-CM | POA: Insufficient documentation
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
## 2022-05-24 ENCOUNTER — Ambulatory Visit: Payer: Medicare Other | Admitting: Physical Therapy

## 2022-05-24 ENCOUNTER — Encounter: Payer: Self-pay | Admitting: Physical Therapy

## 2022-05-24 DIAGNOSIS — G8929 Other chronic pain: Secondary | ICD-10-CM

## 2022-05-24 DIAGNOSIS — R262 Difficulty in walking, not elsewhere classified: Secondary | ICD-10-CM | POA: Diagnosis not present

## 2022-05-24 DIAGNOSIS — M6281 Muscle weakness (generalized): Secondary | ICD-10-CM | POA: Diagnosis not present

## 2022-05-24 DIAGNOSIS — R2689 Other abnormalities of gait and mobility: Secondary | ICD-10-CM

## 2022-05-24 DIAGNOSIS — M25561 Pain in right knee: Secondary | ICD-10-CM | POA: Diagnosis not present

## 2022-05-24 DIAGNOSIS — M25552 Pain in left hip: Secondary | ICD-10-CM

## 2022-05-24 DIAGNOSIS — M25551 Pain in right hip: Secondary | ICD-10-CM | POA: Diagnosis not present

## 2022-05-24 DIAGNOSIS — R296 Repeated falls: Secondary | ICD-10-CM

## 2022-05-24 DIAGNOSIS — M25562 Pain in left knee: Secondary | ICD-10-CM | POA: Diagnosis not present

## 2022-05-29 DIAGNOSIS — F209 Schizophrenia, unspecified: Secondary | ICD-10-CM | POA: Diagnosis not present

## 2022-05-29 DIAGNOSIS — F4310 Post-traumatic stress disorder, unspecified: Secondary | ICD-10-CM | POA: Diagnosis not present

## 2022-05-29 DIAGNOSIS — F315 Bipolar disorder, current episode depressed, severe, with psychotic features: Secondary | ICD-10-CM | POA: Diagnosis not present

## 2022-05-30 ENCOUNTER — Ambulatory Visit: Payer: Medicare Other
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
## 2022-05-31 ENCOUNTER — Ambulatory Visit: Payer: Medicare Other | Admitting: Physical Therapy

## 2022-05-31 ENCOUNTER — Encounter: Payer: Self-pay | Admitting: Physical Therapy

## 2022-05-31 DIAGNOSIS — M25551 Pain in right hip: Secondary | ICD-10-CM | POA: Diagnosis not present

## 2022-05-31 DIAGNOSIS — M25561 Pain in right knee: Secondary | ICD-10-CM | POA: Diagnosis not present

## 2022-05-31 DIAGNOSIS — M6281 Muscle weakness (generalized): Secondary | ICD-10-CM | POA: Diagnosis not present

## 2022-05-31 DIAGNOSIS — R296 Repeated falls: Secondary | ICD-10-CM

## 2022-05-31 DIAGNOSIS — R2689 Other abnormalities of gait and mobility: Secondary | ICD-10-CM

## 2022-05-31 DIAGNOSIS — R262 Difficulty in walking, not elsewhere classified: Secondary | ICD-10-CM | POA: Diagnosis not present

## 2022-05-31 DIAGNOSIS — M25562 Pain in left knee: Secondary | ICD-10-CM | POA: Diagnosis not present

## 2022-05-31 DIAGNOSIS — M25552 Pain in left hip: Secondary | ICD-10-CM

## 2022-05-31 DIAGNOSIS — G8929 Other chronic pain: Secondary | ICD-10-CM

## 2022-06-01 ENCOUNTER — Other Ambulatory Visit: Payer: Self-pay | Admitting: Adult Health

## 2022-06-02 ENCOUNTER — Ambulatory Visit: Payer: Medicare Other

## 2022-06-02 DIAGNOSIS — G8929 Other chronic pain: Secondary | ICD-10-CM | POA: Diagnosis not present

## 2022-06-02 DIAGNOSIS — M25552 Pain in left hip: Secondary | ICD-10-CM

## 2022-06-02 DIAGNOSIS — M25551 Pain in right hip: Secondary | ICD-10-CM | POA: Diagnosis not present

## 2022-06-02 DIAGNOSIS — R262 Difficulty in walking, not elsewhere classified: Secondary | ICD-10-CM

## 2022-06-02 DIAGNOSIS — M6281 Muscle weakness (generalized): Secondary | ICD-10-CM | POA: Diagnosis not present

## 2022-06-02 DIAGNOSIS — M25561 Pain in right knee: Secondary | ICD-10-CM | POA: Diagnosis not present

## 2022-06-02 DIAGNOSIS — M25562 Pain in left knee: Secondary | ICD-10-CM | POA: Diagnosis not present
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
## 2022-06-07 ENCOUNTER — Other Ambulatory Visit: Payer: Self-pay | Admitting: Neurology

## 2022-06-07 ENCOUNTER — Other Ambulatory Visit: Payer: Self-pay | Admitting: Adult Health

## 2022-06-09 ENCOUNTER — Ambulatory Visit: Payer: Medicare Other

## 2022-06-13 ENCOUNTER — Telehealth: Payer: Self-pay

## 2022-06-13 NOTE — Telephone Encounter (Signed)
LVM regarding missed aquatic appointment. Left details of next appointment.   1st no show  Iriana Artley, [place] 06/13/22 12:49 PM
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
## 2022-06-16 ENCOUNTER — Ambulatory Visit: Payer: Medicare Other | Attending: Orthopaedic Surgery

## 2022-06-16 DIAGNOSIS — M6281 Muscle weakness (generalized): Secondary | ICD-10-CM | POA: Diagnosis not present

## 2022-06-16 DIAGNOSIS — G8929 Other chronic pain: Secondary | ICD-10-CM | POA: Insufficient documentation

## 2022-06-16 DIAGNOSIS — R262 Difficulty in walking, not elsewhere classified: Secondary | ICD-10-CM | POA: Insufficient documentation

## 2022-06-16 DIAGNOSIS — M25552 Pain in left hip: Secondary | ICD-10-CM | POA: Insufficient documentation

## 2022-06-16 DIAGNOSIS — M25561 Pain in right knee: Secondary | ICD-10-CM | POA: Diagnosis not present

## 2022-06-16 DIAGNOSIS — R2689 Other abnormalities of gait and mobility: Secondary | ICD-10-CM | POA: Diagnosis not present

## 2022-06-16 DIAGNOSIS — M25562 Pain in left knee: Secondary | ICD-10-CM | POA: Insufficient documentation

## 2022-06-16 DIAGNOSIS — M25551 Pain in right hip: Secondary | ICD-10-CM | POA: Insufficient documentation

## 2022-06-16 DIAGNOSIS — R296 Repeated falls: Secondary | ICD-10-CM | POA: Insufficient documentation
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
## 2022-06-23 ENCOUNTER — Ambulatory Visit: Payer: Medicare Other

## 2022-06-23 ENCOUNTER — Other Ambulatory Visit: Payer: Self-pay | Admitting: Neurology

## 2022-06-23 DIAGNOSIS — M25552 Pain in left hip: Secondary | ICD-10-CM

## 2022-06-23 DIAGNOSIS — R262 Difficulty in walking, not elsewhere classified: Secondary | ICD-10-CM | POA: Diagnosis not present

## 2022-06-23 DIAGNOSIS — G8929 Other chronic pain: Secondary | ICD-10-CM

## 2022-06-23 DIAGNOSIS — M25551 Pain in right hip: Secondary | ICD-10-CM

## 2022-06-23 DIAGNOSIS — M25562 Pain in left knee: Secondary | ICD-10-CM | POA: Diagnosis not present

## 2022-06-23 DIAGNOSIS — M25561 Pain in right knee: Secondary | ICD-10-CM | POA: Diagnosis not present

## 2022-06-23 DIAGNOSIS — M6281 Muscle weakness (generalized): Secondary | ICD-10-CM

## 2022-06-23 MED ORDER — ZOLMITRIPTAN 5 MG NA SOLN: NASAL | 5 refills | Status: DC
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
## 2022-06-28 ENCOUNTER — Encounter: Payer: Self-pay | Admitting: Physical Therapy

## 2022-06-28 ENCOUNTER — Telehealth: Payer: Self-pay | Admitting: Adult Health

## 2022-06-28 ENCOUNTER — Ambulatory Visit: Payer: Medicare Other | Admitting: Physical Therapy

## 2022-06-28 DIAGNOSIS — M25561 Pain in right knee: Secondary | ICD-10-CM | POA: Diagnosis not present

## 2022-06-28 DIAGNOSIS — M25551 Pain in right hip: Secondary | ICD-10-CM

## 2022-06-28 DIAGNOSIS — G8929 Other chronic pain: Secondary | ICD-10-CM | POA: Diagnosis not present

## 2022-06-28 DIAGNOSIS — M6281 Muscle weakness (generalized): Secondary | ICD-10-CM

## 2022-06-28 DIAGNOSIS — R262 Difficulty in walking, not elsewhere classified: Secondary | ICD-10-CM

## 2022-06-28 DIAGNOSIS — M25562 Pain in left knee: Secondary | ICD-10-CM | POA: Diagnosis not present

## 2022-06-28 DIAGNOSIS — R2689 Other abnormalities of gait and mobility: Secondary | ICD-10-CM

## 2022-06-28 DIAGNOSIS — M25552 Pain in left hip: Secondary | ICD-10-CM

## 2022-06-28 DIAGNOSIS — R296 Repeated falls: Secondary | ICD-10-CM

## 2022-06-28 NOTE — Telephone Encounter (Signed)
Pt called to request a referral to see a rheumatologist.   Pt has Medicaid and Medicare.  Last OV:  08/31/2021  Last Virtual Visit:  12/28/2021  Pt was offered an OV and refused, requesting message be sent to NP first.  Please advise.

## 2022-06-29 NOTE — Telephone Encounter (Signed)
Pt stated that PT advised she goes to Rheumatology due to arthritis and her " knees keep locking up on her" as well as Carpel Tunnel.

## 2022-06-29 NOTE — Telephone Encounter (Signed)
Patient notified of update  and verbalized understanding. Pt stated she will reach out to ortho first to see if they can help. She will call us back to make the appt. If needed.

## 2022-07-05 DIAGNOSIS — F4310 Post-traumatic stress disorder, unspecified: Secondary | ICD-10-CM | POA: Diagnosis not present

## 2022-07-05 DIAGNOSIS — F315 Bipolar disorder, current episode depressed, severe, with psychotic features: Secondary | ICD-10-CM | POA: Diagnosis not present

## 2022-07-05 DIAGNOSIS — F209 Schizophrenia, unspecified: Secondary | ICD-10-CM | POA: Diagnosis not present

## 2022-07-07 ENCOUNTER — Ambulatory Visit: Payer: Medicare Other

## 2022-07-10 ENCOUNTER — Other Ambulatory Visit: Payer: Self-pay | Admitting: Adult Health

## 2022-07-10 DIAGNOSIS — G4700 Insomnia, unspecified: Secondary | ICD-10-CM

## 2022-07-11 ENCOUNTER — Other Ambulatory Visit: Payer: Self-pay | Admitting: Neurology

## 2022-07-12 ENCOUNTER — Other Ambulatory Visit (HOSPITAL_COMMUNITY): Payer: Self-pay

## 2022-07-13 ENCOUNTER — Other Ambulatory Visit: Payer: Self-pay | Admitting: *Deleted

## 2022-07-13 MED ORDER — FAMOTIDINE 20 MG PO TABS
20.0000 mg | ORAL_TABLET | Freq: Every day | ORAL | 1 refills | Status: DC
Start: 2022-07-13 — End: 2022-07-18
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
## 2022-07-14 ENCOUNTER — Ambulatory Visit: Payer: Medicare Other | Attending: Orthopaedic Surgery

## 2022-07-14 DIAGNOSIS — M6281 Muscle weakness (generalized): Secondary | ICD-10-CM | POA: Diagnosis not present

## 2022-07-14 DIAGNOSIS — M25561 Pain in right knee: Secondary | ICD-10-CM | POA: Insufficient documentation

## 2022-07-14 DIAGNOSIS — M25552 Pain in left hip: Secondary | ICD-10-CM

## 2022-07-14 DIAGNOSIS — G8929 Other chronic pain: Secondary | ICD-10-CM | POA: Diagnosis not present

## 2022-07-14 DIAGNOSIS — M25551 Pain in right hip: Secondary | ICD-10-CM

## 2022-07-14 DIAGNOSIS — R262 Difficulty in walking, not elsewhere classified: Secondary | ICD-10-CM

## 2022-07-14 DIAGNOSIS — M25562 Pain in left knee: Secondary | ICD-10-CM | POA: Diagnosis not present

## 2022-07-14 DIAGNOSIS — R2689 Other abnormalities of gait and mobility: Secondary | ICD-10-CM

## 2022-07-18 ENCOUNTER — Other Ambulatory Visit: Payer: Self-pay

## 2022-07-18 MED ORDER — FAMOTIDINE 20 MG PO TABS: 20.0000 mg | ORAL_TABLET | Freq: Every day | ORAL | 1 refills | Status: DC

## 2022-07-21 ENCOUNTER — Other Ambulatory Visit: Payer: Self-pay | Admitting: Neurology

## 2022-07-24 ENCOUNTER — Other Ambulatory Visit (HOSPITAL_COMMUNITY): Payer: Self-pay

## 2022-07-25 ENCOUNTER — Telehealth: Payer: Self-pay

## 2022-07-25 ENCOUNTER — Other Ambulatory Visit (HOSPITAL_COMMUNITY): Payer: Self-pay

## 2022-07-25 NOTE — Telephone Encounter (Signed)
Patient Advocate Encounter   Received notification from office that prior authorization is required for Zolmitriptan '5MG'$  solution. PA submitted and APPROVED on 07/25/2022.  Key [id number]  Effective: 11/13/2021 - 07/25/2023.  Clista Bernhardt, CPhT Rx Patient Advocate Phone: [phone]

## 2022-08-16 ENCOUNTER — Ambulatory Visit: Payer: Medicare Other | Admitting: Neurology

## 2022-08-31 ENCOUNTER — Other Ambulatory Visit: Payer: Self-pay | Admitting: *Deleted

## 2022-08-31 DIAGNOSIS — J029 Acute pharyngitis, unspecified: Secondary | ICD-10-CM | POA: Diagnosis not present

## 2022-08-31 DIAGNOSIS — R051 Acute cough: Secondary | ICD-10-CM | POA: Diagnosis not present

## 2022-08-31 DIAGNOSIS — Z1152 Encounter for screening for COVID-19: Secondary | ICD-10-CM | POA: Diagnosis not present

## 2022-08-31 MED ORDER — OMEPRAZOLE 40 MG PO CPDR: 40.0000 mg | DELAYED_RELEASE_CAPSULE | Freq: Every day | ORAL | 0 refills | Status: DC

## 2022-09-01 ENCOUNTER — Ambulatory Visit: Payer: Medicare Other | Admitting: Neurology

## 2022-09-06 DIAGNOSIS — J040 Acute laryngitis: Secondary | ICD-10-CM | POA: Diagnosis not present

## 2022-09-06 DIAGNOSIS — R051 Acute cough: Secondary | ICD-10-CM | POA: Diagnosis not present

## 2022-09-08 ENCOUNTER — Other Ambulatory Visit: Payer: Self-pay | Admitting: Adult Health

## 2022-09-08 ENCOUNTER — Ambulatory Visit: Payer: Medicare Other | Admitting: Neurology

## 2022-09-12 DIAGNOSIS — F209 Schizophrenia, unspecified: Secondary | ICD-10-CM | POA: Diagnosis not present

## 2022-09-12 DIAGNOSIS — F4310 Post-traumatic stress disorder, unspecified: Secondary | ICD-10-CM | POA: Diagnosis not present

## 2022-09-12 DIAGNOSIS — F315 Bipolar disorder, current episode depressed, severe, with psychotic features: Secondary | ICD-10-CM | POA: Diagnosis not present

## 2022-09-13 ENCOUNTER — Other Ambulatory Visit: Payer: Self-pay | Admitting: Adult Health

## 2022-09-13 ENCOUNTER — Telehealth: Payer: Self-pay | Admitting: Neurology

## 2022-09-13 ENCOUNTER — Other Ambulatory Visit: Payer: Self-pay | Admitting: Neurology

## 2022-09-13 DIAGNOSIS — Z760 Encounter for issue of repeat prescription: Secondary | ICD-10-CM

## 2022-09-13 NOTE — Telephone Encounter (Signed)
Pt called informed that Dr Delice Lesch stated Agree with PCP evaluation, seizures can worsen when there is an infection going on
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
## 2022-09-13 NOTE — Telephone Encounter (Signed)
Patient called and left a VM that she had a seizure last night but it was different than ones she has had in the past. She'd like to speak with a nurse.

## 2022-09-13 NOTE — Telephone Encounter (Signed)
Agree with PCP evaluation, seizures can worsen when there is an infection going on.

## 2022-09-14 ENCOUNTER — Encounter: Payer: Self-pay | Admitting: Family Medicine

## 2022-09-14 ENCOUNTER — Telehealth: Payer: Self-pay | Admitting: Adult Health

## 2022-09-14 ENCOUNTER — Ambulatory Visit (INDEPENDENT_AMBULATORY_CARE_PROVIDER_SITE_OTHER): Payer: Medicare Other | Admitting: Family Medicine

## 2022-09-14 VITALS — BP 150/108 | HR 101 | Temp 98.1°F | Wt 178.4 lb

## 2022-09-14 DIAGNOSIS — J019 Acute sinusitis, unspecified: Secondary | ICD-10-CM

## 2022-09-14 DIAGNOSIS — R569 Unspecified convulsions: Secondary | ICD-10-CM

## 2022-09-14 DIAGNOSIS — Z760 Encounter for issue of repeat prescription: Secondary | ICD-10-CM

## 2022-09-14 MED ORDER — CEFUROXIME AXETIL 500 MG PO TABS: 500.0000 mg | ORAL_TABLET | Freq: Two times a day (BID) | ORAL | 0 refills | Status: DC

## 2022-09-14 NOTE — Progress Notes (Signed)
   Subjective:    Patient ID: Molly [name], female    DOB: [birth year], 47 y.o.   MRN: [id number]  HPI Here for an upper respiratory infection that started 2 weeks ago. Her main symptoms are sinus congestion, PND, and coughing up green mucus which is sometimes tinged with blood. She has had fever on and off. At the beginning she had nausea with vomiting and diarrhea, but this has improved. She has had some wheezing and mild SOB as well. She saw urgent care on 08-31-22 and rapid tests for Covid and strep were negative. She was given 7 days of Augmentin. She was then seen again at urgent care on 09-06-22, and a CXR was negative. She was given Prednisone and cough medication, but no more antibiotics. Her young grandson has similar symptoms. Several nights ago while she had a fever and she was having diarrhea and vomiting at the same time she had a generalized seizure. She says she has light seizures once or twice a week where she lies down and it passes. She has not had a seizure like this one for a long time. She spoke to her neurologist, Dr. Delice Lesch, who suggested she see [place]. Of note, Maricel has not taken any of her maintenance seizure medications (Carbamazepine and Topiramate) for the past 2 weeks. She was told to stop these by the pharmacist because they could "cause reactions" when taken with the antibiotics.    Review of Systems  Constitutional:  Positive for fever.  HENT:  Positive for congestion, postnasal drip, sinus pressure and sore throat. Negative for ear pain.   Eyes: Negative.   Respiratory:  Positive for cough. Negative for shortness of breath and wheezing.   Cardiovascular: Negative.   Gastrointestinal:  Positive for diarrhea, nausea and vomiting. Negative for abdominal distention and abdominal pain.  Neurological:  Positive for seizures and headaches.       Objective:   Physical Exam Constitutional:      Appearance: She is well-developed. She is not ill-appearing.  HENT:      Mouth/Throat:     Pharynx: Oropharynx is clear.  Eyes:     Comments: clear  Cardiovascular:     Rate and Rhythm: Normal rate and regular rhythm.     Heart sounds: Normal heart sounds.  Pulmonary:     Effort: Pulmonary effort is normal.     Breath sounds: Normal breath sounds. No wheezing, rhonchi or rales.  Abdominal:     General: Bowel sounds are normal.     Palpations: Abdomen is soft.     Tenderness: There is no abdominal tenderness. There is no guarding.  Musculoskeletal:     Cervical back: Neck supple.  Lymphadenopathy:     Cervical: No cervical adenopathy.  Neurological:     Mental Status: She is alert and oriented to person, place, and time.           Assessment & Plan:  She has a partially treated sinusitis, and we will treat this with 10 days of Cefuroxime. The recent seizure was likely caused by the combination of being sick and of not taking her maintenance medications. I advised her to get back on the Carbamazepine and the Topiramate. Follow up as needed. We spent a total of (32   ) minutes reviewing records and discussing these issues.  Alysia Penna, MD

## 2022-09-14 NOTE — Telephone Encounter (Signed)
Patient needs a refill for Azelastine HCI and Famotidine and Cyclobenzaprine.  Patient has an appointment scheduled for Wednesday, 09/20/22.  Pharmacy- [facility] on [address]

## 2022-09-15 MED ORDER — CYCLOBENZAPRINE HCL 10 MG PO TABS: ORAL_TABLET | ORAL | 1 refills | Status: DC

## 2022-09-15 MED ORDER — AZELASTINE HCL 0.1 % NA SOLN: NASAL | 5 refills | Status: DC

## 2022-09-15 NOTE — Telephone Encounter (Signed)
Ok to fill prescriptions again? Also, famotidine has not been prescribed by you ok to fill that one as well? Please advise

## 2022-09-15 NOTE — Telephone Encounter (Signed)
Rx refilled. Pt notified of update and message below for famotidine. No further action needed.

## 2022-09-20 ENCOUNTER — Encounter: Payer: Self-pay | Admitting: Adult Health

## 2022-09-20 ENCOUNTER — Other Ambulatory Visit: Payer: Self-pay | Admitting: Adult Health

## 2022-09-20 ENCOUNTER — Ambulatory Visit (INDEPENDENT_AMBULATORY_CARE_PROVIDER_SITE_OTHER): Payer: Medicare Other | Admitting: Adult Health

## 2022-09-20 VITALS — BP 150/100 | HR 115 | Temp 98.6°F | Ht 69.0 in | Wt 180.0 lb

## 2022-09-20 DIAGNOSIS — G43001 Migraine without aura, not intractable, with status migrainosus: Secondary | ICD-10-CM

## 2022-09-20 DIAGNOSIS — F32A Depression, unspecified: Secondary | ICD-10-CM

## 2022-09-20 DIAGNOSIS — G4700 Insomnia, unspecified: Secondary | ICD-10-CM

## 2022-09-20 DIAGNOSIS — G40909 Epilepsy, unspecified, not intractable, without status epilepticus: Secondary | ICD-10-CM

## 2022-09-20 DIAGNOSIS — I10 Essential (primary) hypertension: Secondary | ICD-10-CM

## 2022-09-20 DIAGNOSIS — E7800 Pure hypercholesterolemia, unspecified: Secondary | ICD-10-CM

## 2022-09-20 DIAGNOSIS — R053 Chronic cough: Secondary | ICD-10-CM

## 2022-09-20 DIAGNOSIS — F419 Anxiety disorder, unspecified: Secondary | ICD-10-CM | POA: Diagnosis not present

## 2022-09-20 DIAGNOSIS — F310 Bipolar disorder, current episode hypomanic: Secondary | ICD-10-CM

## 2022-09-20 MED ORDER — AMLODIPINE BESYLATE 5 MG PO TABS: 5.0000 mg | ORAL_TABLET | Freq: Every day | ORAL | 0 refills | Status: DC

## 2022-09-20 NOTE — Patient Instructions (Addendum)
[facility] - [facility] Address: [address], [place], [place] [address] Hours:  Open ? Closes 5?PM Phone: [phone]  I am going to refer you to ENT for chronic cough   I am going to start you on a blood pressure medication called Norvasc - take this daily and follow up in 30 days
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
## 2022-09-21 NOTE — Telephone Encounter (Signed)
Okay for refill?  

## 2022-09-22 ENCOUNTER — Other Ambulatory Visit (INDEPENDENT_AMBULATORY_CARE_PROVIDER_SITE_OTHER): Payer: Medicare Other

## 2022-09-22 DIAGNOSIS — I10 Essential (primary) hypertension: Secondary | ICD-10-CM

## 2022-09-22 DIAGNOSIS — G4700 Insomnia, unspecified: Secondary | ICD-10-CM

## 2022-09-22 DIAGNOSIS — E7800 Pure hypercholesterolemia, unspecified: Secondary | ICD-10-CM

## 2022-09-22 DIAGNOSIS — G40909 Epilepsy, unspecified, not intractable, without status epilepticus: Secondary | ICD-10-CM

## 2022-09-22 LAB — LIPID PANEL
Cholesterol: 289 mg/dL — ABNORMAL HIGH (ref 0–200)
HDL: 56.7 mg/dL (ref >39.00)
LDL Cholesterol: 202 mg/dL — ABNORMAL HIGH (ref 0–99)
NonHDL: 232.14
Total CHOL/HDL Ratio: 5
Triglycerides: 150 mg/dL — ABNORMAL HIGH (ref 0.0–149.0)
VLDL: 30 mg/dL (ref 0.0–40.0)

## 2022-09-22 LAB — COMPREHENSIVE METABOLIC PANEL
ALT: 18 U/L (ref 0–35)
AST: 22 U/L (ref 0–37)
Albumin: 4.3 g/dL (ref 3.5–5.2)
Alkaline Phosphatase: 86 U/L (ref 39–117)
BUN: 13 mg/dL (ref 6–23)
CO2: 25 mEq/L (ref 19–32)
Calcium: 9.4 mg/dL (ref 8.4–10.5)
Chloride: 102 mEq/L (ref 96–112)
Creatinine, Ser: 1.14 mg/dL (ref 0.40–1.20)
GFR: 58.78 mL/min — ABNORMAL LOW (ref >60.00)
Glucose, Bld: 95 mg/dL (ref 70–99)
Potassium: 3.6 mEq/L (ref 3.5–5.1)
Sodium: 136 mEq/L (ref 135–145)
Total Bilirubin: 0.5 mg/dL (ref 0.2–1.2)
Total Protein: 7.7 g/dL (ref 6.0–8.3)

## 2022-09-22 LAB — CBC WITH DIFFERENTIAL/PLATELET
Basophils Absolute: 0 10*3/uL (ref 0.0–0.1)
Basophils Relative: 0.2 % (ref 0.0–3.0)
Eosinophils Absolute: 0.1 10*3/uL (ref 0.0–0.7)
Eosinophils Relative: 1.6 % (ref 0.0–5.0)
HCT: 35.2 % — ABNORMAL LOW (ref 36.0–46.0)
Hemoglobin: 11.9 g/dL — ABNORMAL LOW (ref 12.0–15.0)
Lymphocytes Relative: 29.2 % (ref 12.0–46.0)
Lymphs Abs: 1.8 10*3/uL (ref 0.7–4.0)
MCHC: 33.9 g/dL (ref 30.0–36.0)
MCV: 91.2 fl (ref 78.0–100.0)
Monocytes Absolute: 0.5 10*3/uL (ref 0.1–1.0)
Monocytes Relative: 7.3 % (ref 3.0–12.0)
Neutro Abs: 3.9 10*3/uL (ref 1.4–7.7)
Neutrophils Relative %: 61.7 % (ref 43.0–77.0)
Platelets: 249 10*3/uL (ref 150.0–400.0)
RBC: 3.86 Mil/uL — ABNORMAL LOW (ref 3.87–5.11)
RDW: 13 % (ref 11.5–15.5)
WBC: 6.3 10*3/uL (ref 4.0–10.5)

## 2022-09-22 LAB — TSH: TSH: 2.75 u[IU]/mL (ref 0.35–5.50)

## 2022-09-26 ENCOUNTER — Other Ambulatory Visit: Payer: Self-pay | Admitting: Adult Health

## 2022-09-26 ENCOUNTER — Emergency Department (HOSPITAL_BASED_OUTPATIENT_CLINIC_OR_DEPARTMENT_OTHER): Payer: Medicare Other | Admitting: Radiology

## 2022-09-26 ENCOUNTER — Telehealth: Payer: Self-pay | Admitting: Adult Health

## 2022-09-26 ENCOUNTER — Emergency Department (HOSPITAL_BASED_OUTPATIENT_CLINIC_OR_DEPARTMENT_OTHER)
Admission: EM | Admit: 2022-09-26 | Discharge: 2022-09-26 | Disposition: A | Discharge status: Home / Self Care | Payer: Medicare Other | Attending: Emergency Medicine | Admitting: Emergency Medicine

## 2022-09-26 ENCOUNTER — Other Ambulatory Visit: Payer: Self-pay

## 2022-09-26 ENCOUNTER — Telehealth: Payer: Self-pay | Admitting: Gastroenterology

## 2022-09-26 ENCOUNTER — Encounter: Payer: Self-pay | Admitting: Adult Health

## 2022-09-26 ENCOUNTER — Emergency Department (HOSPITAL_BASED_OUTPATIENT_CLINIC_OR_DEPARTMENT_OTHER): Payer: Medicare Other

## 2022-09-26 ENCOUNTER — Encounter (HOSPITAL_BASED_OUTPATIENT_CLINIC_OR_DEPARTMENT_OTHER): Payer: Self-pay

## 2022-09-26 DIAGNOSIS — R109 Unspecified abdominal pain: Secondary | ICD-10-CM | POA: Insufficient documentation

## 2022-09-26 DIAGNOSIS — M545 Low back pain, unspecified: Secondary | ICD-10-CM | POA: Diagnosis not present

## 2022-09-26 DIAGNOSIS — R0602 Shortness of breath: Secondary | ICD-10-CM | POA: Diagnosis not present

## 2022-09-26 DIAGNOSIS — R1012 Left upper quadrant pain: Secondary | ICD-10-CM | POA: Diagnosis not present

## 2022-09-26 LAB — URINALYSIS, ROUTINE W REFLEX MICROSCOPIC
Bilirubin Urine: NEGATIVE
Glucose, UA: NEGATIVE mg/dL
Hgb urine dipstick: NEGATIVE
Ketones, ur: NEGATIVE mg/dL
Leukocytes,Ua: NEGATIVE
Nitrite: NEGATIVE
Protein, ur: NEGATIVE mg/dL
Specific Gravity, Urine: 1.009 (ref 1.005–1.030)
pH: 6 (ref 5.0–8.0)

## 2022-09-26 LAB — PREGNANCY, URINE: Preg Test, Ur: NEGATIVE

## 2022-09-26 MED ORDER — ATORVASTATIN CALCIUM 40 MG PO TABS: 40.0000 mg | ORAL_TABLET | Freq: Every day | ORAL | 3 refills | Status: DC

## 2022-09-26 MED ORDER — ACETAMINOPHEN 500 MG PO TABS
1000.0000 mg | ORAL_TABLET | Freq: Once | ORAL | Status: AC
  Administered 2022-09-26: 1000 mg via ORAL
  Filled 2022-09-26: qty 2

## 2022-09-26 MED ORDER — OXYCODONE HCL 5 MG PO TABS
5.0000 mg | ORAL_TABLET | Freq: Once | ORAL | Status: AC
  Administered 2022-09-26: 5 mg via ORAL
  Filled 2022-09-26: qty 1

## 2022-09-26 NOTE — Telephone Encounter (Signed)
This is a patient of Dr Ardis Hughs.  I am sending this refill request for Trulance and Miralax as you are DOD pm.  Please advise refills.

## 2022-09-26 NOTE — Discharge Instructions (Signed)
Please call your family doctor today and let them know about your visit here.  See when they want to see you again in the office.  Continue to take your home medications that you take for your hip.  Please return to the emergency department for worsening pain fever inability eat or drink.  Your x-rays did not show any acute fractures or pneumonia.

## 2022-09-26 NOTE — Telephone Encounter (Signed)
Pt call and stated she have pain running down her left leg want you to give her a call back she stated she went to [facility] ER but they didn't give her anything .

## 2022-09-26 NOTE — ED Triage Notes (Addendum)
Pt presents POV with LUQ pain radiating around to her back and down into her Left leg x1 day.  Pt reports back pain last week as well  Pt now reports "trickling" this am, states, "I had to force my pee out" denies hx of kidney stones

## 2022-09-26 NOTE — Telephone Encounter (Signed)
Pt has been scheduled.  °

## 2022-09-26 NOTE — ED Provider Notes (Signed)
[facility] EMERGENCY DEPT Provider Note   CSN: [id number] Arrival date & time: 09/26/22  0815     History  Chief Complaint  Patient presents with   Abdominal Pain    Molly [name] is a 45 y.o. female.  46 yo F with a chief complaints of left-sided pain.  Patient has had chronic left hip pain for a while and has a TENS unit for it.  This pains been going on for about a day or so but later tells me that she has been having some pain going on for a while.  Worse with certain positions standing twisting ambulating.  Denies trauma denies rash.  Has been coughing quite a bit for the past few weeks that she cannot seem to get over.  No fevers.  Has seen her family doctor multiple times for this.  Has had COVID and flu testing multiple times and all negative.   Abdominal Pain      Home Medications Prior to Admission medications   Medication Sig Start Date End Date Taking? Authorizing Provider  amantadine (SYMMETREL) 100 MG capsule Take 100 mg by mouth daily. 12/09/21   [provider]  amLODipine (NORVASC) 5 MG tablet Take 1 tablet (5 mg total) by mouth daily. 09/20/22   Nafziger, [name], NP  ammonium lactate (LAC-HYDRIN) 12 % lotion APPLY TO AFFECTED AREA(S) AS NEEDED FOR DRY SKIN 01/05/20   Hyatt, Max T, DPM  atorvastatin (LIPITOR) 40 MG tablet Take 1 tablet (40 mg total) by mouth daily. 09/26/22   Nafziger, [name], NP  Azelaic Acid 15 % cream  12/31/19   [provider]  azelastine (ASTELIN) 0.1 % nasal spray USE 1 SPRAY INTO EACH NOSTRIL TWICE A DAY 09/15/22   Nafziger, [name], NP  azelastine (OPTIVAR) 0.05 % ophthalmic solution INSTILL 1 DROP INTO BOTH EYES TWICE A DAY 11/15/21   Nafziger, [name], NP  benztropine (COGENTIN) 1 MG tablet Take 1 mg by mouth daily. 08/25/20   [provider]  Calcium Carbonate-Vit D-Min (CALCIUM 1200 PO) Take by mouth.    [provider]  CAPLYTA 42 MG CAPS  12/29/19   [provider]  carbamazepine  (TEGRETOL) 200 MG tablet Take 1 tablet (200 mg total) by mouth 3 (three) times daily. 04/28/22   Cameron Sprang, MD  cetirizine (ZYRTEC) 10 MG tablet Take 1 tablet (10 mg total) by mouth daily. **DUE FOR YEARLY PHYSICAL** 04/22/20   Nafziger, [name], NP  cholecalciferol (VITAMIN D) 1000 units tablet Take 1 tablet (1,000 Units total) by mouth daily. 06/22/16   Nafziger, [name], NP  cyclobenzaprine (FLEXERIL) 10 MG tablet TAKE 1 TABLET BY MOUTH EVERYDAY AT BEDTIME 09/15/22   Nafziger, [name], NP  diclofenac Sodium (VOLTAREN) 1 % GEL APPLY 2 GRAMS TO AFFECTED AREA FOUR TIMES DAILY 10/28/19   Magnus Sinning, MD  EPIPEN 2-PAK 0.3 MG/0.3ML SOAJ injection INJECT 0.3ML INTO THE MUSCLE 11/14/16   Laurey Morale, MD  Eduard Roux (AIMOVIG) 140 MG/ML SOAJ INJECT '140MG'$  INTO THE SKIN EVERY MONTH 04/28/22   Cameron Sprang, MD  famotidine (PEPCID) 20 MG tablet Take 1 tablet (20 mg total) by mouth at bedtime. 07/18/22   Tanda Rockers, MD  fluticasone (FLONASE) 50 MCG/ACT nasal spray SPRAY 2 SPRAYS INTO EACH NOSTRIL EVERY DAY 07/11/22   Nafziger, [name], NP  haloperidol (HALDOL) 5 MG tablet Take 5 mg by mouth 2 (two) times daily. 08/25/20   [provider]  HYDROcodone-acetaminophen (NORCO/VICODIN) 5-325 MG tablet  11/25/18   [provider]  ibuprofen (ADVIL) 800 MG tablet TAKE 1 TABLET BY MOUTH EVERY 8 HOURS AS NEEDED 09/13/22   Nafziger, [name], NP  Multiple Vitamin (MULTIVITAMIN) capsule Take 1 capsule by mouth daily.    [provider]  NONFORMULARY OR COMPOUNDED [facility]: Antiinflammatory cream - Diclofenac 3%, Baclofen 2%, Lidocaine 2%, apply 1-2 grams to affected 3-4 times daily. 11/22/16   Hyatt, Max T, DPM  OLANZapine (ZYPREXA) 10 MG tablet Take 10 mg by mouth at bedtime. 10/20/20   [provider]  omeprazole (PRILOSEC) 40 MG capsule Take 1 capsule (40 mg total) by mouth daily. 08/31/22   Tanda Rockers, MD  Plecanatide (TRULANCE) 3 MG TABS Take 1 tablet by mouth  daily. Patient taking differently: Take 1 tablet by mouth daily. 5 mg 05/20/20   Milus Banister, MD  polyethylene glycol powder [facility]) 17 GM/SCOOP powder Take 255 g by mouth daily. 05/20/20   Milus Banister, MD  QUEtiapine (SEROQUEL) 400 MG tablet TAKE 1 TABLET (400 MG TOTAL) BY MOUTH AT BEDTIME. 03/23/21   Nafziger, [name], NP  SUMAtriptan Dellis Filbert) 20 MG/ACT nasal spray Both Nares 1-2 Times Daily AS Needed 04/28/22   Cameron Sprang, MD  topiramate (TOPAMAX) 200 MG tablet Take 1 tablet twice a day 04/28/22   Cameron Sprang, MD  TRINTELLIX 10 MG TABS tablet Take 10 mg by mouth daily. 06/01/20   [provider]  TRINTELLIX 5 MG TABS tablet TAKE 1 TABLET DAILY AT DINNERTIME 03/23/21   Nafziger, [name], NP  zinc gluconate 50 MG tablet Take 50 mg by mouth daily.    [provider]  zolmitriptan (ZOMIG) 5 MG nasal solution PLACE 1 SPRAY INTO THE NOSE AS NEEDED FOR MIGRAINE. 09/13/22   Cameron Sprang, MD  zolpidem (AMBIEN) 10 MG tablet TAKE 1 TABLET BY MOUTH EVERY DAY AT BEDTIME AS NEEDED 09/21/22   Nafziger, [name], NP      Allergies    Fish allergy, Benzyl alcohol, Heparin, Coumadin [warfarin sodium], Crestor [rosuvastatin], Doxycycline, Phenergan [promethazine hcl], Toradol [ketorolac tromethamine], and Nystatin    Review of Systems   Review of Systems  Gastrointestinal:  Positive for abdominal pain.    Physical Exam Updated Vital Signs BP 131/82 (BP Location: Right Arm)   Pulse 100   Temp 98.9 F (37.2 C) (Oral)   Resp 18   Ht '5\' 9"'$  (1.753 m)   Wt 81.6 kg   SpO2 99%   BMI 26.58 kg/m  Physical Exam Vitals and nursing note reviewed.  Constitutional:      General: She is not in acute distress.    Appearance: She is well-developed. She is not diaphoretic.  HENT:     Head: Normocephalic and atraumatic.  Eyes:     Pupils: Pupils are equal, round, and reactive to light.  Cardiovascular:     Rate and Rhythm: Normal rate and regular rhythm.     Heart sounds: No  murmur heard.    No friction rub. No gallop.  Pulmonary:     Effort: Pulmonary effort is normal.     Breath sounds: No wheezing or rales.  Abdominal:     General: There is no distension.     Palpations: Abdomen is soft.     Tenderness: There is abdominal tenderness.     Comments: Pain in the left upper quadrant versus pain overlying the left lower rib margin.  Reproduced with certain positions no rash.  Musculoskeletal:        General: No  tenderness.     Cervical back: Normal range of motion and neck supple.     Comments: No midline spinal tenderness step-offs or deformities.  Pain along the left oblique area between the ribs and the iliac crest along the mid axillary line.  Pulse motor and sensation intact distally.  Reflexes are 2+ and equal.  No clonus.  Skin:    General: Skin is warm and dry.  Neurological:     Mental Status: She is alert and oriented to person, place, and time.  Psychiatric:        Behavior: Behavior normal.     ED Results / Procedures / Treatments   Labs (all labs ordered are listed, but only abnormal results are displayed) Labs Reviewed  URINALYSIS, ROUTINE W REFLEX MICROSCOPIC - Abnormal; Notable for the following components:      Result Value   Color, Urine COLORLESS (*)    All other components within normal limits  PREGNANCY, URINE    EKG None  Radiology DG Lumbar Spine Complete  Result Date: 09/26/2022 CLINICAL DATA:  Acute low back pain radiating to the left lower extremity. No reported injury. EXAM: LUMBAR SPINE - COMPLETE 4+ VIEW COMPARISON:  09/06/2017 CT abdomen/pelvis FINDINGS: This report assumes 5 non rib-bearing lumbar vertebrae. Lumbar vertebral body heights are preserved, with no fracture. Lumbar disc heights are preserved. No spondylosis. No spondylolisthesis. No significant facet arthropathy. No aggressive appearing focal osseous lesions. Straightening of the lumbar spine. IMPRESSION: Straightening of the lumbar spine, usually due to  positioning and/or muscle spasm. No fracture or spondylolisthesis. No significant degenerative changes. Electronically Signed   By: Ilona Sorrel M.D.   On: 09/26/2022 09:38   DG Chest 1 View  Result Date: 09/26/2022 CLINICAL DATA:  Shortness of breath. EXAM: CHEST  1 VIEW COMPARISON:  04/22/2020 FINDINGS: The cardiomediastinal silhouette is unremarkable. There is no evidence of focal airspace disease, pulmonary edema, suspicious pulmonary nodule/mass, pleural effusion, or pneumothorax. No acute bony abnormalities are identified. IMPRESSION: No active disease. Electronically Signed   By: Margarette Canada M.D.   On: 09/26/2022 09:29    Procedures Procedures    Medications Ordered in ED Medications  acetaminophen (TYLENOL) tablet 1,000 mg (1,000 mg Oral Given 09/26/22 0934)  oxyCODONE (Oxy IR/ROXICODONE) immediate release tablet 5 mg (5 mg Oral Given 09/26/22 6160)    ED Course/ Medical Decision Making/ A&P                           Medical Decision Making Amount and/or Complexity of Data Reviewed Labs: ordered. Radiology: ordered.  Risk OTC drugs. Prescription drug management.   46 yo F with a chief complaints of left side pain.  She points more to the left upper quadrant initially.  Pain is worse with twisting turning and moving palpation.  The pain seems to occur with her left low back pain that it sounds like she has had for a while.  She is also endorsing some urinary symptoms.  She is also had an upper respiratory illness for a few weeks.  We will obtain a chest x-ray.  Plain film of the L-spine as the patient did reported a episode where she thinks maybe she had a seizure and was woke up on the ground.  Has a history of seizures.  Chest x-ray independently interpreted by me without focal infiltrate or pneumothorax.  Plain film of the L-spine without obvious fracture on my independent interpretation.  UA is negative for  infection or hematuria.  We will discharge the patient home.  PCP  follow-up.  9:43 AM:  I have discussed the diagnosis/risks/treatment options with the patient.  Evaluation and diagnostic testing in the emergency department does not suggest an emergent condition requiring admission or immediate intervention beyond what has been performed at this time.  They will follow up with PCP. We also discussed returning to the ED immediately if new or worsening sx occur. We discussed the sx which are most concerning (e.g., sudden worsening pain, fever, inability to tolerate by mouth) that necessitate immediate return. Medications administered to the patient during their visit and any new prescriptions provided to the patient are listed below.  Medications given during this visit Medications  acetaminophen (TYLENOL) tablet 1,000 mg (1,000 mg Oral Given 09/26/22 0934)  oxyCODONE (Oxy IR/ROXICODONE) immediate release tablet 5 mg (5 mg Oral Given 09/26/22 0934)     The patient appears reasonably screen and/or stabilized for discharge and I doubt any other medical condition or other [facility] requiring further screening, evaluation, or treatment in the ED at this time prior to discharge.          Final Clinical Impression(s) / ED Diagnoses Final diagnoses:  Left flank pain    Rx / DC Orders ED Discharge Orders     None         Deno Etienne, DO 09/26/22 [phone]

## 2022-09-26 NOTE — Telephone Encounter (Signed)
Inbound call from patient requesting a refill for Trulance and Polyethylene Glycol Power. Please advise.

## 2022-09-26 NOTE — ED Notes (Signed)
Pt given a UA cup for sample, states, Not sure I can give a sample"

## 2022-09-27 ENCOUNTER — Telehealth: Payer: Self-pay | Admitting: Adult Health

## 2022-09-27 ENCOUNTER — Other Ambulatory Visit: Payer: Self-pay

## 2022-09-27 ENCOUNTER — Ambulatory Visit: Payer: Medicare Other | Admitting: Family Medicine

## 2022-09-27 ENCOUNTER — Encounter (HOSPITAL_BASED_OUTPATIENT_CLINIC_OR_DEPARTMENT_OTHER): Payer: Self-pay

## 2022-09-27 ENCOUNTER — Emergency Department (HOSPITAL_BASED_OUTPATIENT_CLINIC_OR_DEPARTMENT_OTHER)
Admission: EM | Admit: 2022-09-27 | Discharge: 2022-09-27 | Disposition: A | Discharge status: Home / Self Care | Payer: Medicare Other | Attending: Emergency Medicine | Admitting: Emergency Medicine

## 2022-09-27 DIAGNOSIS — M79605 Pain in left leg: Secondary | ICD-10-CM | POA: Diagnosis not present

## 2022-09-27 DIAGNOSIS — M5416 Radiculopathy, lumbar region: Secondary | ICD-10-CM | POA: Diagnosis not present

## 2022-09-27 DIAGNOSIS — M545 Low back pain, unspecified: Secondary | ICD-10-CM | POA: Diagnosis present

## 2022-09-27 MED ORDER — METHYLPREDNISOLONE SODIUM SUCC 125 MG IJ SOLR
125.0000 mg | Freq: Once | INTRAMUSCULAR | Status: DC
  Filled 2022-09-27: qty 2

## 2022-09-27 MED ORDER — LIDOCAINE 5 % EX PTCH
1.0000 | MEDICATED_PATCH | CUTANEOUS | Status: DC
  Administered 2022-09-27: 1 via TRANSDERMAL
  Filled 2022-09-27: qty 1

## 2022-09-27 MED ORDER — METHYLPREDNISOLONE SODIUM SUCC 125 MG IJ SOLR
125.0000 mg | Freq: Once | INTRAMUSCULAR | Status: AC
  Administered 2022-09-27: 125 mg via INTRAMUSCULAR

## 2022-09-27 MED ORDER — PREDNISONE 10 MG (21) PO TBPK: ORAL_TABLET | Freq: Every day | ORAL | 0 refills | Status: DC

## 2022-09-27 MED ORDER — METHOCARBAMOL 500 MG PO TABS: 500.0000 mg | ORAL_TABLET | Freq: Two times a day (BID) | ORAL | 0 refills | Status: DC

## 2022-09-27 NOTE — Telephone Encounter (Signed)
Pt requesting ultrasound of leg and back, requests it today if possible. Went to ED yesterday they did an xray. Pt requests a call

## 2022-09-27 NOTE — ED Provider Notes (Signed)
[facility] EMERGENCY DEPT Provider Note   CSN: [id number] Arrival date & time: 09/27/22  1126     History  Chief Complaint  Patient presents with   Back Pain   Leg Pain    Molly [name] is a 46 y.o. female.  46 year old female presents with complaint of left lower back pain radiating down her left leg.  Patient presented to this emergency room yesterday with complaint of left side abdominal pain, felt to be more left back pain, had a lumbar spine x-ray and was discharged without any prescriptions.  Patient has been using her TENS unit at home with no relief.  Is followed by orthopedics, has had injections in her hips previously, no prior spine injections.  Denies falls or injuries, abdominal pain, loss of bowel or bladder control, IV drug use, fever.  Is taking Flexeril for general muscle pain without improvement in her current pain.       Home Medications Prior to Admission medications   Medication Sig Start Date End Date Taking? Authorizing Provider  methocarbamol (ROBAXIN) 500 MG tablet Take 1 tablet (500 mg total) by mouth 2 (two) times daily. 09/27/22  Yes Tacy Learn, PA-C  predniSONE (STERAPRED UNI-PAK 21 TAB) 10 MG (21) TBPK tablet Take by mouth daily. Take 6 tabs by mouth daily  for 2 days, then 5 tabs for 2 days, then 4 tabs for 2 days, then 3 tabs for 2 days, 2 tabs for 2 days, then 1 tab by mouth daily for 2 days 09/27/22  Yes Tacy Learn, PA-C  amantadine (SYMMETREL) 100 MG capsule Take 100 mg by mouth daily. 12/09/21   [provider]  amLODipine (NORVASC) 5 MG tablet Take 1 tablet (5 mg total) by mouth daily. 09/20/22   Nafziger, [name], NP  ammonium lactate (LAC-HYDRIN) 12 % lotion APPLY TO AFFECTED AREA(S) AS NEEDED FOR DRY SKIN 01/05/20   Hyatt, Max T, DPM  atorvastatin (LIPITOR) 40 MG tablet Take 1 tablet (40 mg total) by mouth daily. 09/26/22   Nafziger, [name], NP  Azelaic Acid 15 % cream  12/31/19   [provider]   azelastine (ASTELIN) 0.1 % nasal spray USE 1 SPRAY INTO EACH NOSTRIL TWICE A DAY 09/15/22   Nafziger, [name], NP  azelastine (OPTIVAR) 0.05 % ophthalmic solution INSTILL 1 DROP INTO BOTH EYES TWICE A DAY 11/15/21   Nafziger, [name], NP  benztropine (COGENTIN) 1 MG tablet Take 1 mg by mouth daily. 08/25/20   [provider]  Calcium Carbonate-Vit D-Min (CALCIUM 1200 PO) Take by mouth.    [provider]  CAPLYTA 42 MG CAPS  12/29/19   [provider]  carbamazepine (TEGRETOL) 200 MG tablet Take 1 tablet (200 mg total) by mouth 3 (three) times daily. 04/28/22   Cameron Sprang, MD  cetirizine (ZYRTEC) 10 MG tablet Take 1 tablet (10 mg total) by mouth daily. **DUE FOR YEARLY PHYSICAL** 04/22/20   Nafziger, [name], NP  cholecalciferol (VITAMIN D) 1000 units tablet Take 1 tablet (1,000 Units total) by mouth daily. 06/22/16   Nafziger, [name], NP  diclofenac Sodium (VOLTAREN) 1 % GEL APPLY 2 GRAMS TO AFFECTED AREA FOUR TIMES DAILY 10/28/19   Magnus Sinning, MD  EPIPEN 2-PAK 0.3 MG/0.3ML SOAJ injection INJECT 0.3ML INTO THE MUSCLE 11/14/16   Laurey Morale, MD  Eduard Roux (AIMOVIG) 140 MG/ML SOAJ INJECT '140MG'$  INTO THE SKIN EVERY MONTH 04/28/22   Cameron Sprang, MD  famotidine (PEPCID) 20 MG tablet Take 1 tablet (20 mg total)  by mouth at bedtime. 07/18/22   Tanda Rockers, MD  fluticasone (FLONASE) 50 MCG/ACT nasal spray SPRAY 2 SPRAYS INTO EACH NOSTRIL EVERY DAY 07/11/22   Nafziger, [name], NP  haloperidol (HALDOL) 5 MG tablet Take 5 mg by mouth 2 (two) times daily. 08/25/20   [provider]  HYDROcodone-acetaminophen (NORCO/VICODIN) 5-325 MG tablet  11/25/18   [provider]  ibuprofen (ADVIL) 800 MG tablet TAKE 1 TABLET BY MOUTH EVERY 8 HOURS AS NEEDED 09/13/22   Nafziger, [name], NP  Multiple Vitamin (MULTIVITAMIN) capsule Take 1 capsule by mouth daily.    [provider]  NONFORMULARY OR COMPOUNDED [place]: Antiinflammatory cream - Diclofenac 3%,  Baclofen 2%, Lidocaine 2%, apply 1-2 grams to affected 3-4 times daily. 11/22/16   Hyatt, Max T, DPM  OLANZapine (ZYPREXA) 10 MG tablet Take 10 mg by mouth at bedtime. 10/20/20   [provider]  omeprazole (PRILOSEC) 40 MG capsule Take 1 capsule (40 mg total) by mouth daily. 08/31/22   Tanda Rockers, MD  Plecanatide (TRULANCE) 3 MG TABS Take 1 tablet by mouth daily. Patient taking differently: Take 1 tablet by mouth daily. 5 mg 05/20/20   Milus Banister, MD  polyethylene glycol powder [facility]) 17 GM/SCOOP powder Take 255 g by mouth daily. 05/20/20   Milus Banister, MD  QUEtiapine (SEROQUEL) 400 MG tablet TAKE 1 TABLET (400 MG TOTAL) BY MOUTH AT BEDTIME. 03/23/21   Nafziger, [name], NP  SUMAtriptan Dellis Filbert) 20 MG/ACT nasal spray Both Nares 1-2 Times Daily AS Needed 04/28/22   Cameron Sprang, MD  topiramate (TOPAMAX) 200 MG tablet Take 1 tablet twice a day 04/28/22   Cameron Sprang, MD  TRINTELLIX 10 MG TABS tablet Take 10 mg by mouth daily. 06/01/20   [provider]  TRINTELLIX 5 MG TABS tablet TAKE 1 TABLET DAILY AT DINNERTIME 03/23/21   Nafziger, [name], NP  zinc gluconate 50 MG tablet Take 50 mg by mouth daily.    [provider]  zolmitriptan (ZOMIG) 5 MG nasal solution PLACE 1 SPRAY INTO THE NOSE AS NEEDED FOR MIGRAINE. 09/13/22   Cameron Sprang, MD  zolpidem (AMBIEN) 10 MG tablet TAKE 1 TABLET BY MOUTH EVERY DAY AT BEDTIME AS NEEDED 09/21/22   Dorothyann Peng, NP      Allergies    Fish allergy, Benzyl alcohol, Heparin, Coumadin [warfarin sodium], Crestor [rosuvastatin], Doxycycline, Phenergan [promethazine hcl], Toradol [ketorolac tromethamine], and Nystatin    Review of Systems   Review of Systems Negative except as per HPI Physical Exam Updated Vital Signs BP (!) 139/97 (BP Location: Left Arm)   Pulse (!) 105   Temp 98.2 F (36.8 C)   Resp 16   Ht '5\' 9"'$  (1.753 m)   Wt 81.6 kg   SpO2 100%   BMI 26.58 kg/m  Physical Exam Vitals and nursing note  reviewed.  Constitutional:      General: She is not in acute distress.    Appearance: She is well-developed. She is not diaphoretic.  HENT:     Head: Normocephalic and atraumatic.  Cardiovascular:     Pulses: Normal pulses.  Pulmonary:     Effort: Pulmonary effort is normal.  Musculoskeletal:        General: Tenderness present. No swelling or deformity.     Thoracic back: No tenderness or bony tenderness.     Lumbar back: Spasms and tenderness present. No bony tenderness.       Back:  Right lower leg: No edema.     Left lower leg: No edema.     Comments: Palpable left para spinous lumbar muscle spasm with left SI tenderness.  No lower extremity swelling, no calf tenderness.  Skin:    General: Skin is warm and dry.     Findings: No erythema or rash.  Neurological:     Mental Status: She is alert and oriented to person, place, and time.     Sensory: No sensory deficit.     Motor: No weakness.     Gait: Gait normal.     Deep Tendon Reflexes: Reflexes normal.     Reflex Scores:      Patellar reflexes are 2+ on the right side and 2+ on the left side.      Achilles reflexes are 1+ on the right side and 1+ on the left side. Psychiatric:        Behavior: Behavior normal.     ED Results / Procedures / Treatments   Labs (all labs ordered are listed, but only abnormal results are displayed) Labs Reviewed - No data to display  EKG None  Radiology DG Lumbar Spine Complete  Result Date: 09/26/2022 CLINICAL DATA:  Acute low back pain radiating to the left lower extremity. No reported injury. EXAM: LUMBAR SPINE - COMPLETE 4+ VIEW COMPARISON:  09/06/2017 CT abdomen/pelvis FINDINGS: This report assumes 5 non rib-bearing lumbar vertebrae. Lumbar vertebral body heights are preserved, with no fracture. Lumbar disc heights are preserved. No spondylosis. No spondylolisthesis. No significant facet arthropathy. No aggressive appearing focal osseous lesions. Straightening of the lumbar  spine. IMPRESSION: Straightening of the lumbar spine, usually due to positioning and/or muscle spasm. No fracture or spondylolisthesis. No significant degenerative changes. Electronically Signed   By: Ilona Sorrel M.D.   On: 09/26/2022 09:38   DG Chest 1 View  Result Date: 09/26/2022 CLINICAL DATA:  Shortness of breath. EXAM: CHEST  1 VIEW COMPARISON:  04/22/2020 FINDINGS: The cardiomediastinal silhouette is unremarkable. There is no evidence of focal airspace disease, pulmonary edema, suspicious pulmonary nodule/mass, pleural effusion, or pneumothorax. No acute bony abnormalities are identified. IMPRESSION: No active disease. Electronically Signed   By: Margarette Canada M.D.   On: 09/26/2022 09:29    Procedures Procedures    Medications Ordered in ED Medications  lidocaine (LIDODERM) 5 % 1 patch (1 patch Transdermal Patch Applied 09/27/22 1229)  methylPREDNISolone sodium succinate (SOLU-MEDROL) 125 mg/2 mL injection 125 mg (125 mg Intramuscular Given 09/27/22 1227)    ED Course/ Medical Decision Making/ A&P                           Medical Decision Making  46 year old female with complaint of left lower back pain radiating down the left leg.  Reflexes intact, sensation intact, equal leg strength.  Pain reproduced with palpation along the palpable muscle spasm left lumbar paraspinous area extending into left SI.  Review of lumbar spine x-rays completed yesterday in this emergency room shows straightening of the lumbar spine secondary to positioning versus spasm.  In this case, suspect spasm.  Patient declines narcotic pain medications, is allergic to Toradol.  We will try switching from Flexeril to Robaxin, topical lidoderm, prednisone taper.  Patient is scheduled to see her PCP tomorrow.  Discussed possible benefit from physical therapy.        Final Clinical Impression(s) / ED Diagnoses Final diagnoses:  Lumbar radiculopathy, acute    Rx / DC Orders  ED Discharge Orders           Ordered    predniSONE (STERAPRED UNI-PAK 21 TAB) 10 MG (21) TBPK tablet  Daily        09/27/22 1159    methocarbamol (ROBAXIN) 500 MG tablet  2 times daily        09/27/22 1159              Roque Lias 09/27/22 1253    Tretha Sciara, MD 09/28/22 [phone]

## 2022-09-27 NOTE — Telephone Encounter (Signed)
Please call patient and schedule with an APP for medication refills. Explain to patient medications can be refilled to get her to the appointment once she is scheduled and then more refills will be given after she is seen.  Thank you

## 2022-09-27 NOTE — Discharge Instructions (Signed)
Prednisone as prescribed, start tomorrow (given injection of steroid in the ER). Robaxin as prescribed for muscle spasm- take this instead of your Flexeril. Follow up with your PCP as scheduled, may benefit from referral to physical therapy.

## 2022-09-27 NOTE — ED Triage Notes (Signed)
Pt c/o left lower back pain that radiates down her left leg for the past 3 days. Denies injury. Pt wants this RN to note that she does not want any pain medication.

## 2022-09-27 NOTE — Telephone Encounter (Signed)
LVM for patient to call back to schedule an appointment for refills.

## 2022-09-27 NOTE — Telephone Encounter (Signed)
Sent pt a mychart message. 

## 2022-09-28 ENCOUNTER — Encounter: Payer: Self-pay | Admitting: Family Medicine

## 2022-09-28 ENCOUNTER — Ambulatory Visit (INDEPENDENT_AMBULATORY_CARE_PROVIDER_SITE_OTHER): Payer: Medicare Other | Admitting: Family Medicine

## 2022-09-28 ENCOUNTER — Encounter: Payer: Self-pay | Admitting: Orthopaedic Surgery

## 2022-09-28 ENCOUNTER — Ambulatory Visit (INDEPENDENT_AMBULATORY_CARE_PROVIDER_SITE_OTHER): Payer: Medicare Other | Admitting: Orthopaedic Surgery

## 2022-09-28 VITALS — BP 120/90 | HR 121 | Temp 98.5°F | Ht 69.0 in

## 2022-09-28 DIAGNOSIS — M5442 Lumbago with sciatica, left side: Secondary | ICD-10-CM | POA: Diagnosis not present

## 2022-09-28 DIAGNOSIS — G8929 Other chronic pain: Secondary | ICD-10-CM

## 2022-09-28 DIAGNOSIS — R110 Nausea: Secondary | ICD-10-CM

## 2022-09-28 DIAGNOSIS — M545 Low back pain, unspecified: Secondary | ICD-10-CM

## 2022-09-28 MED ORDER — HYDROCODONE-ACETAMINOPHEN 5-325 MG PO TABS: 1.0000 | ORAL_TABLET | Freq: Four times a day (QID) | ORAL | 0 refills | Status: AC | PRN

## 2022-09-28 MED ORDER — ONDANSETRON 4 MG PO TBDP: 4.0000 mg | ORAL_TABLET | Freq: Three times a day (TID) | ORAL | 0 refills | Status: DC | PRN

## 2022-09-28 MED ORDER — GABAPENTIN 300 MG PO CAPS: 300.0000 mg | ORAL_CAPSULE | Freq: Two times a day (BID) | ORAL | 1 refills | Status: DC
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
## 2022-09-28 NOTE — Progress Notes (Signed)
Established Patient Office Visit  Subjective   Patient ID: Molly [name], female    DOB: June 29, 1976  Age: 46 y.o. MRN: [id number]  Chief Complaint  Patient presents with   Back Pain    X4 days, no known injury, seen in the ER yesterday and diagnosed with nerve problem, advised to see PCP and orthopedic provider (states she has an appt today with ortho)    Patient is here for ER follow up of her acute back pain and sciatica. States that she has had problems in the past with her left hip but went to ortho and received injections which improved her pain. I reviewed the ER note and the lumbar x-ray that was performed. States the ER gave her medication including muscle relaxers (methocarbamol) which made her nauseous and did not help with her symptoms. States that the injection she was given in the ER did help but the pain is still very severe.  States she is developing a headache and the pain was not improved, states the injection eased the pain but the pain is still present. States she is using her tens unit but it is not helping. States it starts on the left hip in the posterior/ buttock area then shoots down the back of the leg, wraps around the knee into her toes. States that it feels like lightning going down her leg. States that the pain is so bad at times that her leg is giving out.  States she was already taking prednisone for an upper respiratory infection and the ER gave her another course.    Back Pain This is a recurrent problem. The current episode started in the past 7 days. The problem occurs constantly. The problem is unchanged. The pain is present in the gluteal. The quality of the pain is described as stabbing. The pain radiates to the left foot and left knee. The pain is severe. The pain is The same all the time. Stiffness is present All day. Associated symptoms include headaches. Pertinent negatives include no bladder incontinence, bowel incontinence, fever or tingling.       Review of Systems  Constitutional:  Negative for fever.  Gastrointestinal:  Negative for bowel incontinence.  Genitourinary:  Negative for bladder incontinence.  Musculoskeletal:  Positive for back pain.  Neurological:  Positive for headaches. Negative for tingling.  All other systems reviewed and are negative.     Objective:     BP (!) 120/90 (BP Location: Left Arm, Patient Position: Sitting, Cuff Size: Normal)   Pulse (!) 121   Temp 98.5 F (36.9 C) (Oral)   Ht '5\' 9"'$  (1.753 m)   SpO2 99%   BMI 26.58 kg/m    Physical Exam Vitals reviewed.  Constitutional:      Appearance: Normal appearance. She is well-groomed and normal weight.  Cardiovascular:     Rate and Rhythm: Normal rate and regular rhythm.     Pulses: Normal pulses.     Heart sounds: S1 normal and S2 normal.  Pulmonary:     Effort: Pulmonary effort is normal.     Breath sounds: Normal breath sounds and air entry.  Musculoskeletal:     Right lower leg: No edema.     Left lower leg: No edema.     Comments: Severe pain impedes participation in the MSK exam.  Neurological:     Mental Status: She is alert and oriented to person, place, and time. Mental status is at baseline.     Gait: Gait is  intact.  Psychiatric:        Mood and Affect: Mood and affect normal.        Speech: Speech normal.        Behavior: Behavior normal.        Judgment: Judgment normal.      No results found for any visits on 09/28/22.    The 10-year ASCVD risk score (Arnett DK, et al., 2019) is: 1.9%    Assessment & Plan:   Problem List Items Addressed This Visit   None Visit Diagnoses     Acute left-sided back pain with sciatica    -  Primary   Relevant Medications   Patient's pain remains severe, intractable despite use of prednisone, muscle relaxers. I will give her a short 3 day course of norco and I suggested starting gabapentin since it seems that her story if more consistent with nerve pain rather than MSK  pain given the type of pain and the fact that it radiates down the back of her leg. Also, the pain is constant, not associated with weight bearing, although this certainly makes it worse. She has an appt today with ortho to further evaluate but it is my impression that she will need an MRI of the lumbar spine. Patient was warned of the side effects of the medications, specifically increased sedation/ drowsiness and the possibility of interaction with her other medications. However, her medication list I do not think is accurate-- I advised the paitent to check her medications list against the bottles that she has at home and to contact us about which ones should be discontinued off the list.  HYDROcodone-acetaminophen (NORCO/VICODIN) 5-325 MG tablet every 6 hours PRN severe pain   gabapentin (NEURONTIN) 300 MG capsule, start 1 capsule daily at bedtime for 1 week, then increase to BID    Nausea       Relevant Medications   Side effect from the methocarbamol, will treat with PRN ondansetron ondansetron (ZOFRAN-ODT) 4 MG disintegrating tablet- 1 tablet every 8 hours PRN        No follow-ups on file.    Farrel Conners, MD

## 2022-09-29 ENCOUNTER — Ambulatory Visit: Payer: Medicare Other | Admitting: Adult Health

## 2022-09-30 ENCOUNTER — Ambulatory Visit
Admission: RE | Admit: 2022-09-30 | Discharge: 2022-09-30 | Disposition: A | Discharge status: Home / Self Care | Payer: Medicare Other | Source: Ambulatory Visit | Attending: Orthopaedic Surgery | Admitting: Orthopaedic Surgery

## 2022-09-30 DIAGNOSIS — M545 Low back pain, unspecified: Secondary | ICD-10-CM

## 2022-10-02 ENCOUNTER — Telehealth: Payer: Self-pay

## 2022-10-02 NOTE — Telephone Encounter (Signed)
Patient wanting to be called with MRI results [id number]

## 2022-10-03 ENCOUNTER — Other Ambulatory Visit: Payer: Self-pay | Admitting: Internal Medicine

## 2022-10-03 ENCOUNTER — Encounter: Payer: Self-pay | Admitting: Adult Health

## 2022-10-03 ENCOUNTER — Ambulatory Visit (INDEPENDENT_AMBULATORY_CARE_PROVIDER_SITE_OTHER): Payer: Medicare Other | Admitting: Adult Health

## 2022-10-03 VITALS — BP 120/80 | HR 72 | Temp 98.8°F | Ht 69.0 in | Wt 180.0 lb

## 2022-10-03 DIAGNOSIS — M5442 Lumbago with sciatica, left side: Secondary | ICD-10-CM | POA: Diagnosis not present

## 2022-10-03 DIAGNOSIS — G894 Chronic pain syndrome: Secondary | ICD-10-CM | POA: Diagnosis not present

## 2022-10-03 DIAGNOSIS — R059 Cough, unspecified: Secondary | ICD-10-CM | POA: Diagnosis not present

## 2022-10-03 DIAGNOSIS — I10 Essential (primary) hypertension: Secondary | ICD-10-CM | POA: Diagnosis not present

## 2022-10-03 MED ORDER — POLYETHYLENE GLYCOL 3350 17 GM/SCOOP PO POWD: 1.0000 | Freq: Every day | ORAL | 1 refills | Status: DC

## 2022-10-03 MED ORDER — TRULANCE 3 MG PO TABS: 1.0000 | ORAL_TABLET | Freq: Every day | ORAL | 1 refills | Status: DC

## 2022-10-03 MED ORDER — POLYETHYLENE GLYCOL 3350 17 GM/SCOOP PO POWD: ORAL | 1 refills | Status: DC

## 2022-10-03 NOTE — Telephone Encounter (Signed)
Patient scheduled 1/5 with a pa

## 2022-10-03 NOTE — Patient Instructions (Signed)
Cut your Norvasc in half to see if it helps with the dizziness
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
## 2022-10-03 NOTE — Addendum Note (Signed)
Addended by: Stevan Born on: 10/03/2022 02:36 PM   Modules accepted: Orders

## 2022-10-03 NOTE — Telephone Encounter (Signed)
Patient called stating that the pharmacy advised her that the proscription for Polyethylene glycol power was sent in wrong and needs to be changed to a cap full daily. Please advise.

## 2022-10-03 NOTE — Telephone Encounter (Addendum)
Rx for Trulance and Miralax sent to pharmacy as requested.  Patient is scheduled to follow up on 11-17-22 with Amy Almedia.

## 2022-10-04 ENCOUNTER — Other Ambulatory Visit: Payer: Self-pay | Admitting: Adult Health

## 2022-10-04 ENCOUNTER — Encounter: Payer: Self-pay | Admitting: Orthopaedic Surgery

## 2022-10-04 ENCOUNTER — Telehealth: Payer: Self-pay | Admitting: Physician Assistant

## 2022-10-04 ENCOUNTER — Ambulatory Visit: Payer: Medicare Other | Admitting: Orthopaedic Surgery

## 2022-10-04 ENCOUNTER — Telehealth: Payer: Self-pay | Admitting: Adult Health

## 2022-10-04 DIAGNOSIS — Z01419 Encounter for gynecological examination (general) (routine) without abnormal findings: Secondary | ICD-10-CM

## 2022-10-04 DIAGNOSIS — G8929 Other chronic pain: Secondary | ICD-10-CM

## 2022-10-04 DIAGNOSIS — M79604 Pain in right leg: Secondary | ICD-10-CM

## 2022-10-04 NOTE — Telephone Encounter (Signed)
Please advise 

## 2022-10-04 NOTE — Telephone Encounter (Signed)
Pt stated her Ortho Dr stated she needed a referral to a Gyn Dr as well as Circulatory Dr.   Please advise.

## 2022-10-04 NOTE — Telephone Encounter (Signed)
I spoke with Molly [name] today by phone.  Dr. Durward Fortes has reviewed her MRI of her lumbar spine which was essentially negative.  She has had recent lab work CMP CBC and thyroid studies which are fairly normal.  She describes her back pain that is now in her lower back into her buttock and it feels heavy.  She said her legs feel "heavy ".  We did suggest regrouping with her primary care doctor for possible referral for further workup by vascular or GYN.  She did say that her primary care doctor wanted her to follow-up with [place].  She would like to do physical therapy again I will order this for her.  We will also get her an appointment to see Dr. Laurance Flatten our back specialist.  I also emphasized to her to consider seeing a gynecologist as she has not seen one in a while.  She understands that there could be other causes for her symptoms

## 2022-10-04 NOTE — Telephone Encounter (Signed)
Patient notified of update  and verbalized understanding. 

## 2022-10-05 ENCOUNTER — Telehealth: Payer: Self-pay | Admitting: Pharmacy Technician

## 2022-10-05 ENCOUNTER — Other Ambulatory Visit (HOSPITAL_COMMUNITY): Payer: Self-pay

## 2022-10-05 NOTE — Telephone Encounter (Signed)
PA has been submitted.

## 2022-10-05 NOTE — Telephone Encounter (Signed)
PA has been submitted, and telephone encounter has been created. 

## 2022-10-05 NOTE — Telephone Encounter (Signed)
Patient Advocate Encounter  Received notification from [facility] that prior authorization for TRULANCE '3MG'$  is required.   PA submitted on 11.23.23 Key [id number] Status is pending    Luciano Cutter, CPhT Patient Advocate Phone: [phone]

## 2022-10-06 NOTE — Telephone Encounter (Signed)
Patient Advocate Encounter  Prior Authorization for TRULANCE '3MG'$  has been approved.    PA# [id number] Effective dates: 1.1.23 through 11.22.24

## 2022-10-07 DIAGNOSIS — R52 Pain, unspecified: Secondary | ICD-10-CM | POA: Diagnosis not present

## 2022-10-07 DIAGNOSIS — R052 Subacute cough: Secondary | ICD-10-CM | POA: Diagnosis not present

## 2022-10-09 ENCOUNTER — Other Ambulatory Visit: Payer: Self-pay | Admitting: Adult Health

## 2022-10-09 ENCOUNTER — Other Ambulatory Visit: Payer: Self-pay | Admitting: Family Medicine

## 2022-10-09 ENCOUNTER — Other Ambulatory Visit (HOSPITAL_COMMUNITY): Payer: Self-pay

## 2022-10-09 ENCOUNTER — Other Ambulatory Visit: Payer: Self-pay | Admitting: Internal Medicine

## 2022-10-09 ENCOUNTER — Telehealth: Payer: Self-pay | Admitting: Primary Care

## 2022-10-09 DIAGNOSIS — R110 Nausea: Secondary | ICD-10-CM

## 2022-10-09 MED ORDER — ALBUTEROL SULFATE HFA 108 (90 BASE) MCG/ACT IN AERS: 2.0000 | INHALATION_SPRAY | Freq: Four times a day (QID) | RESPIRATORY_TRACT | 6 refills | Status: DC | PRN

## 2022-10-09 MED ORDER — FLUTICASONE PROPIONATE HFA 110 MCG/ACT IN AERO: 1.0000 | INHALATION_SPRAY | Freq: Two times a day (BID) | RESPIRATORY_TRACT | 1 refills | Status: DC

## 2022-10-09 NOTE — Telephone Encounter (Signed)
Ok x one each but make sure she has f/u w/in one week to reassess  - ok to see NP

## 2022-10-09 NOTE — Telephone Encounter (Signed)
Called and spoke with patient. She stated that she was seen at an urgent care this past Saturday and was diagnosed with PNA. The UC was not able to confirm the PNA since their machine was not working, but they went ahead and treated her. She was prescribed a zpak, amoxicillin '500mg'$  as well as some cough syrup.   The provider at the UC advised her to call us for a refill on her Flovent and albuterol. Upon looking at her chart, both of these medications have been removed completely from her medication list. She stated that she was Flovent 127mg. She is aware that I will need to get permission from a provider to send in these inhalers.   Dr. WMelvyn Novas can you please advise? Thanks!

## 2022-10-09 NOTE — Telephone Encounter (Signed)
Called and spoke with patient. She verbalized understanding and will keep her F/U that is scheduled for next week. She confirmed that she is using the Flovent 1 puff twice daily. RXs have been sent.   Nothing further needed at time of call.

## 2022-10-09 NOTE — Telephone Encounter (Signed)
PA for Trulance is approved. Please sign enconter

## 2022-10-10 ENCOUNTER — Ambulatory Visit (INDEPENDENT_AMBULATORY_CARE_PROVIDER_SITE_OTHER): Payer: Medicare Other

## 2022-10-10 ENCOUNTER — Ambulatory Visit (INDEPENDENT_AMBULATORY_CARE_PROVIDER_SITE_OTHER): Payer: Medicare Other | Admitting: Orthopedic Surgery

## 2022-10-10 VITALS — Ht 69.0 in | Wt 180.0 lb

## 2022-10-10 DIAGNOSIS — G8929 Other chronic pain: Secondary | ICD-10-CM | POA: Diagnosis not present

## 2022-10-10 DIAGNOSIS — M545 Low back pain, unspecified: Secondary | ICD-10-CM

## 2022-10-10 DIAGNOSIS — R052 Subacute cough: Secondary | ICD-10-CM | POA: Diagnosis not present

## 2022-10-10 MED ORDER — OMEPRAZOLE 40 MG PO CPDR: 40.0000 mg | DELAYED_RELEASE_CAPSULE | Freq: Every day | ORAL | 0 refills | Status: DC

## 2022-10-10 NOTE — Progress Notes (Signed)
Orthopedic Spine Surgery Office Note  Assessment: Patient is a 46 y.o. female with history of fibromyalgia who presents with low back pain and associated left leg pain.  Stated pain radiated down the entire leg.  She has no significant stenosis on MRI.  Pain has been improving with time.   Plan: -Since pain has been improving with time, I recommended continued conservative treatments.  We discussed physical therapy.  I told her to learn the core strengthening exercises and continue to do those on a regular basis at least 3-4 times per week at home. -Patient has tried muscle relaxer, activity modification, Medrol Dosepak, narcotics -She can use over-the-counter pain medications for additional pain relief -We discussed an EMG/NCS if she develops recurrent shooting left leg pains since I do not see any areas of stenosis on the MRI -Patient should return to office on an as-needed basis   Patient expressed understanding of the plan and all questions were answered to the patient's satisfaction.   ___________________________________________________________________________   History:  Patient is a 46 y.o. female who presents today for lumbar spine.  Patient has developed low back pain that she felt radiate down the left leg.  She felt it was severe when it started.  Felt the pain radiate down the back of her left leg.  No pain down the right leg.  Pain was so severe that it limited her ability to do daily activities.  Pain is slowly been getting better.  She is now able to be more active.  There is no trauma or injury that brought on the pain.   Weakness: Denies Symptoms of imbalance: Denies Paresthesias and numbness: Denies Bowel or bladder incontinence: Denies Saddle anesthesia: Denies  Treatments tried: Narcotics, activity modification, muscle relaxer, Medrol Dosepak  Review of systems: Denies fevers and chills, night sweats, unexplained weight loss, history of cancer.  Has had pain that  wakes her at night  Past medical history: Hyperlipidemia Hypertension Migraines Depression Anxiety Fibromyalgia GERD Irritable bowel syndrome Osteoporosis Epilepsy Chronic pain  Allergies: benzyl alcohol, heparin, coumadin, crestor, doxycycline, methocarbamol, phenergan, toradol, nystatin  Past surgical history:  Hysterectomy, tubal ligation Aiken osteotomy Bunionectomy  Social history: Denies use of nicotine product (smoking, vaping, patches, smokeless) Alcohol use: denies Denies recreational drug use   Physical Exam:  General: no acute distress, appears stated age Neurologic: alert, answering questions appropriately, following commands Respiratory: unlabored breathing on room air, symmetric chest rise Psychiatric: appropriate affect, normal cadence to speech   MSK (spine):  -Strength exam      Left  Right EHL    5/5  5/5 TA    5/5  5/5 GSC    5/5  5/5 Knee extension  5/5  5/5 Hip flexion   5/5  5/5  -Sensory exam    Sensation intact to light touch in L3-S1 nerve distributions of bilateral lower extremities  -Achilles DTR: 1/4 on the left, 1/4 on the right -Patellar tendon DTR: 1/4 on the left, 1/4 on the right  -Straight leg raise: Negative -Contralateral straight leg raise: Negative -Clonus: no beats bilaterally  -Left hip exam: No pain through range of motion, negative FABER, negative [place]inchfield -Right hip exam: Some pain with internal rotation otherwise no pain through range of motion, negative Corky Sox, negative Stinchfield  Imaging: XR of the lumbar spine from 09/26/2022 and 10/10/2022 was independently reviewed and interpreted, showing no significant degenerative changes. Loss of lumbar lordosis. LL of 27. PI of 45.  No evidence of instability on flexion-extension.  MRI  of the lumbar spine from 09/30/2022 was independently reviewed and interpreted, showing no significant degenerative changes or stenosis.    Patient name: Molly [name] Patient MRN: [id number] Date of visit: 10/10/22

## 2022-10-11 ENCOUNTER — Ambulatory Visit: Payer: Medicare Other | Attending: Orthopaedic Surgery

## 2022-10-11 ENCOUNTER — Other Ambulatory Visit: Payer: Self-pay

## 2022-10-11 DIAGNOSIS — M5459 Other low back pain: Secondary | ICD-10-CM | POA: Diagnosis not present

## 2022-10-11 DIAGNOSIS — M545 Low back pain, unspecified: Secondary | ICD-10-CM | POA: Insufficient documentation

## 2022-10-11 DIAGNOSIS — R2689 Other abnormalities of gait and mobility: Secondary | ICD-10-CM | POA: Insufficient documentation

## 2022-10-11 DIAGNOSIS — G8929 Other chronic pain: Secondary | ICD-10-CM | POA: Diagnosis not present

## 2022-10-11 DIAGNOSIS — M6281 Muscle weakness (generalized): Secondary | ICD-10-CM | POA: Diagnosis not present
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
## 2022-10-11 NOTE — Telephone Encounter (Signed)
Called pt to advise that medications need to come from Psychiatry office. However, pt was in the middle of an appt. Was not able to advise. Pt will call back.

## 2022-10-12 MED ORDER — EPINEPHRINE 0.3 MG/0.3ML IJ SOAJ: 0.3000 mg | INTRAMUSCULAR | 1 refills | Status: AC | PRN

## 2022-10-13 ENCOUNTER — Ambulatory Visit: Payer: Medicare Other

## 2022-10-13 ENCOUNTER — Ambulatory Visit: Payer: Medicare HMO

## 2022-10-13 ENCOUNTER — Telehealth: Payer: Self-pay

## 2022-10-13 NOTE — Telephone Encounter (Signed)
Tried to call pt no answer

## 2022-10-13 NOTE — Telephone Encounter (Signed)
Caller states she went to the Urgent Care and the beginning of the week she was told she had pneumonia and then later in the week she was told she has bronchitis and now she can't feel her face. She states it is totally numb and she is having lower chest pain and upper severe abd. pain. She states it is stabbing.  ---Caller states she was diagnosed with Pneumonia. Currently having numbness in the face and flank pain   10/12/2022 4:10:46 PM See HCP within 4 Hours (or PCP triage) Lavina Hamman, RN, [place]  Referrals GO TO [facility]  Pt was seen on 10/10/22 at [facility] on [address]: Patient presents today for continued productive cough of yellowish sputum. She was treated for pneumonia during her last visit 5 days ago and has completed that course. During her last visit with me, chest x-ray was not available in clinic so I treated her for less likely pneumonia with antibiotics however patient is still not having any improvement in her cough but also does not report any worsening either. This is most likely bronchitis. Did still consider PE as a possible source of patient's complaints. Still less concern for that given marked chest congestion with productive cough which would be less likely seen in pulmonary embolism however did offer D-dimer testing for definitive rule out. Patient declined to have this test done. We will switch her cough syrup from Bromfed to Cheratussin. She has follow-up with her pulmonologist on 12/5 and they recently refilled both of her inhalers so she can continue those as well she awaits a second opinion from them. ED precautions discussed with patient which would warrant immediate follow-up in the meantime    10/13/22 at 1052 - Pt states she continues to have intmiettent lower chest pain on left side as 8 on 0-10 pain scale. Intermittent abdm pain right underneath rid cabe; when she presses on that area it feels tender; rates as 10 on 0-10 pain scale.  Pt has appt with pulmonology on 12/5 that she plans to keep.

## 2022-10-16 NOTE — Telephone Encounter (Signed)
Left message on machine for patient to return our call 
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
## 2022-10-17 ENCOUNTER — Ambulatory Visit: Payer: Medicare Other | Admitting: Primary Care

## 2022-10-17 NOTE — Telephone Encounter (Signed)
Called pt no answer again

## 2022-10-20 ENCOUNTER — Ambulatory Visit: Payer: Medicare Other | Admitting: Adult Health

## 2022-10-20 ENCOUNTER — Ambulatory Visit: Payer: Medicare HMO | Attending: Orthopaedic Surgery

## 2022-10-20 DIAGNOSIS — M25561 Pain in right knee: Secondary | ICD-10-CM | POA: Diagnosis not present

## 2022-10-20 DIAGNOSIS — R2689 Other abnormalities of gait and mobility: Secondary | ICD-10-CM | POA: Diagnosis not present

## 2022-10-20 DIAGNOSIS — M6281 Muscle weakness (generalized): Secondary | ICD-10-CM | POA: Diagnosis not present

## 2022-10-20 DIAGNOSIS — M5459 Other low back pain: Secondary | ICD-10-CM | POA: Insufficient documentation

## 2022-10-20 DIAGNOSIS — G8929 Other chronic pain: Secondary | ICD-10-CM | POA: Insufficient documentation

## 2022-10-20 DIAGNOSIS — M25562 Pain in left knee: Secondary | ICD-10-CM | POA: Diagnosis not present
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
## 2022-10-23 ENCOUNTER — Encounter: Payer: Medicare Other | Admitting: Obstetrics and Gynecology

## 2022-10-23 ENCOUNTER — Telehealth: Payer: Self-pay | Admitting: Adult Health

## 2022-10-23 NOTE — Telephone Encounter (Signed)
Pt is calling and she was sent to  gyn and per pt gyn want to give her a pap smear and pt said she does not do pap smear and wanted to know the reason for referral to gyn. Pt would like to see circulatory doctor

## 2022-10-24 ENCOUNTER — Encounter (HOSPITAL_BASED_OUTPATIENT_CLINIC_OR_DEPARTMENT_OTHER): Payer: Self-pay | Admitting: Emergency Medicine

## 2022-10-24 ENCOUNTER — Emergency Department (HOSPITAL_BASED_OUTPATIENT_CLINIC_OR_DEPARTMENT_OTHER)
Admission: EM | Admit: 2022-10-24 | Discharge: 2022-10-24 | Disposition: A | Discharge status: Home / Self Care | Payer: Medicare HMO | Attending: Emergency Medicine | Admitting: Emergency Medicine

## 2022-10-24 ENCOUNTER — Emergency Department (HOSPITAL_BASED_OUTPATIENT_CLINIC_OR_DEPARTMENT_OTHER): Payer: Medicare HMO

## 2022-10-24 ENCOUNTER — Other Ambulatory Visit: Payer: Self-pay

## 2022-10-24 DIAGNOSIS — R1032 Left lower quadrant pain: Secondary | ICD-10-CM | POA: Diagnosis present

## 2022-10-24 DIAGNOSIS — K5900 Constipation, unspecified: Secondary | ICD-10-CM | POA: Diagnosis not present

## 2022-10-24 DIAGNOSIS — Z79899 Other long term (current) drug therapy: Secondary | ICD-10-CM | POA: Insufficient documentation

## 2022-10-24 DIAGNOSIS — R109 Unspecified abdominal pain: Secondary | ICD-10-CM | POA: Diagnosis not present

## 2022-10-24 DIAGNOSIS — I10 Essential (primary) hypertension: Secondary | ICD-10-CM | POA: Insufficient documentation

## 2022-10-24 LAB — URINALYSIS, ROUTINE W REFLEX MICROSCOPIC
Bilirubin Urine: NEGATIVE
Glucose, UA: NEGATIVE mg/dL
Hgb urine dipstick: NEGATIVE
Ketones, ur: NEGATIVE mg/dL
Nitrite: NEGATIVE
Specific Gravity, Urine: 1.025 (ref 1.005–1.030)
pH: 6 (ref 5.0–8.0)

## 2022-10-24 LAB — COMPREHENSIVE METABOLIC PANEL
ALT: 22 U/L (ref 0–44)
AST: 20 U/L (ref 15–41)
Albumin: 5 g/dL (ref 3.5–5.0)
Alkaline Phosphatase: 106 U/L (ref 38–126)
Anion gap: 13 (ref 5–15)
BUN: 13 mg/dL (ref 6–20)
CO2: 25 mmol/L (ref 22–32)
Calcium: 10.1 mg/dL (ref 8.9–10.3)
Chloride: 100 mmol/L (ref 98–111)
Creatinine, Ser: 1.04 mg/dL — ABNORMAL HIGH (ref 0.44–1.00)
GFR, Estimated: >60 mL/min (ref >60)
Glucose, Bld: 91 mg/dL (ref 70–99)
Potassium: 3.5 mmol/L (ref 3.5–5.1)
Sodium: 138 mmol/L (ref 135–145)
Total Bilirubin: 0.5 mg/dL (ref 0.3–1.2)
Total Protein: 8.4 g/dL — ABNORMAL HIGH (ref 6.5–8.1)

## 2022-10-24 LAB — CBC
HCT: 36.9 % (ref 36.0–46.0)
Hemoglobin: 12.4 g/dL (ref 12.0–15.0)
MCH: 30.9 pg (ref 26.0–34.0)
MCHC: 33.6 g/dL (ref 30.0–36.0)
MCV: 92 fL (ref 80.0–100.0)
Platelets: 286 10*3/uL (ref 150–400)
RBC: 4.01 MIL/uL (ref 3.87–5.11)
RDW: 12.5 % (ref 11.5–15.5)
WBC: 7.4 10*3/uL (ref 4.0–10.5)
nRBC: 0 % (ref 0.0–0.2)

## 2022-10-24 LAB — LIPASE, BLOOD: Lipase: 64 U/L — ABNORMAL HIGH (ref 11–51)

## 2022-10-24 MED ORDER — SODIUM CHLORIDE 0.9 % IV BOLUS: 1000.0000 mL | Freq: Once | INTRAVENOUS | Status: DC

## 2022-10-24 MED ORDER — FLEET ENEMA 7-19 GM/118ML RE ENEM: 1.0000 | ENEMA | Freq: Every day | RECTAL | 1 refills | Status: AC | PRN

## 2022-10-24 MED ORDER — POLYETHYLENE GLYCOL 3350 17 GM/SCOOP PO POWD: 17.0000 g | Freq: Every day | ORAL | 0 refills | Status: AC

## 2022-10-24 NOTE — ED Notes (Signed)
Refused IV and fluids provider notified

## 2022-10-24 NOTE — ED Provider Notes (Signed)
[facility] EMERGENCY DEPT Provider Note   CSN: [id number] Arrival date & time: 10/24/22  1940     History  Chief Complaint  Patient presents with   Abdominal Pain    Molly [name] is a 46 y.o. female.  The history is provided by the patient and medical records. No language interpreter was used.  Abdominal Pain    46 year old female significant history of GERD, constipation, bipolar, fibromyalgia's, depression, hypertension presenting with complaint of abdominal pain.  Patient report for the past month she has had intermittent abdominal pain.  Her pain initially started in her back then it manifest more on the left lower quadrant, then right lower quadrant, but for the past 2 days she endorsed pain to her mid abdomen.  Pain is crampy, soreness, hurts when she moves or when she is trying to button her pants.  She did endorse some nausea and vomiting a few days prior but that is since resolved.  She does have history of constipation but states she has some normal stool earlier today.  She is able to pass flatus.  She has history of chronic constipation currently on Linzess.  She does not endorse any urinary discomfort no vaginal bleeding or vaginal discharge.  She has had hysterectomy.  She did not notice any blood per her rectum.  She went to urgent care today for her symptoms but was sent here for further evaluation.  Home Medications Prior to Admission medications   Medication Sig Start Date End Date Taking? Authorizing Provider  albuterol (VENTOLIN HFA) 108 (90 Base) MCG/ACT inhaler Inhale 2 puffs into the lungs every 6 (six) hours as needed for wheezing or shortness of breath. 10/09/22   Tanda Rockers, MD  amantadine (SYMMETREL) 100 MG capsule Take 100 mg by mouth daily. 12/09/21   [provider]  amLODipine (NORVASC) 5 MG tablet Take 1 tablet (5 mg total) by mouth daily. 09/20/22   Nafziger, [name], NP  ammonium lactate (LAC-HYDRIN) 12 % lotion APPLY TO  AFFECTED AREA(S) AS NEEDED FOR DRY SKIN 01/05/20   Hyatt, Max T, DPM  atorvastatin (LIPITOR) 40 MG tablet Take 1 tablet (40 mg total) by mouth daily. 09/26/22   Nafziger, [name], NP  Azelaic Acid 15 % cream  12/31/19   [provider]  azelastine (ASTELIN) 0.1 % nasal spray USE 1 SPRAY INTO EACH NOSTRIL TWICE A DAY 09/15/22   Nafziger, [name], NP  azelastine (OPTIVAR) 0.05 % ophthalmic solution INSTILL 1 DROP INTO BOTH EYES TWICE A DAY 11/15/21   Nafziger, [name], NP  benztropine (COGENTIN) 1 MG tablet Take 1 mg by mouth daily. 08/25/20   [provider]  Calcium Carbonate-Vit D-Min (CALCIUM 1200 PO) Take by mouth.    [provider]  CAPLYTA 42 MG CAPS  12/29/19   [provider]  carbamazepine (TEGRETOL) 200 MG tablet Take 1 tablet (200 mg total) by mouth 3 (three) times daily. 04/28/22   Cameron Sprang, MD  cetirizine (ZYRTEC) 10 MG tablet Take 1 tablet (10 mg total) by mouth daily. **DUE FOR YEARLY PHYSICAL** 04/22/20   Nafziger, [name], NP  cyclobenzaprine (FLEXERIL) 10 MG tablet Take 10 mg by mouth 3 (three) times daily as needed for muscle spasms.    [provider]  diclofenac Sodium (VOLTAREN) 1 % GEL APPLY 2 GRAMS TO AFFECTED AREA FOUR TIMES DAILY 10/28/19   Magnus Sinning, MD  EPINEPHrine (EPIPEN 2-PAK) 0.3 mg/0.3 mL IJ SOAJ injection Inject 0.3 mg into the muscle as needed for anaphylaxis.  10/12/22   Nafziger, [name], NP  Eduard Roux (AIMOVIG) 140 MG/ML SOAJ INJECT '140MG'$  INTO THE SKIN EVERY MONTH 04/28/22   Cameron Sprang, MD  famotidine (PEPCID) 20 MG tablet Take 1 tablet (20 mg total) by mouth at bedtime. 07/18/22   Tanda Rockers, MD  fluticasone (FLONASE) 50 MCG/ACT nasal spray SPRAY 2 SPRAYS INTO EACH NOSTRIL EVERY DAY 07/11/22   Nafziger, [name], NP  fluticasone (FLOVENT HFA) 110 MCG/ACT inhaler Inhale 1 puff into the lungs in the morning and at bedtime. 10/09/22   Tanda Rockers, MD  gabapentin (NEURONTIN) 300 MG capsule Take 1 capsule (300 mg total)  by mouth 2 (two) times daily. Start with 1 capsule daily at bedtime for 7 days, then increase to 1 capsule twice a day. Patient not taking: Reported on 10/03/2022 09/28/22   Farrel Conners, MD  haloperidol (HALDOL) 5 MG tablet Take 5 mg by mouth 2 (two) times daily. 08/25/20   [provider]  ibuprofen (ADVIL) 800 MG tablet TAKE 1 TABLET BY MOUTH EVERY 8 HOURS AS NEEDED 09/13/22   Nafziger, [name], NP  Multiple Vitamin (MULTIVITAMIN) capsule Take 1 capsule by mouth daily.    [provider]  NONFORMULARY OR COMPOUNDED May: Antiinflammatory cream - Diclofenac 3%, Baclofen 2%, Lidocaine 2%, apply 1-2 grams to affected 3-4 times daily. 11/22/16   Hyatt, Max T, DPM  OLANZapine (ZYPREXA) 10 MG tablet Take 10 mg by mouth at bedtime. 10/20/20   [provider]  omeprazole (PRILOSEC) 40 MG capsule Take 1 capsule (40 mg total) by mouth daily. 10/10/22   Tanda Rockers, MD  ondansetron (ZOFRAN-ODT) 4 MG disintegrating tablet Take 1 tablet (4 mg total) by mouth every 8 (eight) hours as needed for nausea or vomiting (for nausea from wegovy or other source). 09/28/22   Farrel Conners, MD  Plecanatide (TRULANCE) 3 MG TABS Take 1 tablet by mouth daily. 10/03/22   Sharyn Creamer, MD  polyethylene glycol powder (GLYCOLAX/MIRALAX) 17 GM/SCOOP powder 1 capful daily. 10/03/22   Sharyn Creamer, MD  QUEtiapine (SEROQUEL) 400 MG tablet TAKE 1 TABLET (400 MG TOTAL) BY MOUTH AT BEDTIME. 03/23/21   Nafziger, [name], NP  SUMAtriptan Dellis Filbert) 20 MG/ACT nasal spray Both Nares 1-2 Times Daily AS Needed 04/28/22   Cameron Sprang, MD  topiramate (TOPAMAX) 200 MG tablet Take 1 tablet twice a day 04/28/22   Cameron Sprang, MD  TRINTELLIX 10 MG TABS tablet Take 10 mg by mouth daily. 06/01/20   [provider]  zinc gluconate 50 MG tablet Take 50 mg by mouth daily.    [provider]  zolmitriptan (ZOMIG) 5 MG nasal solution PLACE 1 SPRAY INTO THE NOSE AS NEEDED FOR  MIGRAINE. 09/13/22   Cameron Sprang, MD  zolpidem (AMBIEN) 10 MG tablet TAKE 1 TABLET BY MOUTH EVERY DAY AT BEDTIME AS NEEDED 09/21/22   Nafziger, [name], NP      Allergies    Fish allergy, Benzyl alcohol, Heparin, Coumadin [warfarin sodium], Crestor [rosuvastatin], Doxycycline, Methocarbamol, Phenergan [promethazine hcl], Toradol [ketorolac tromethamine], and Nystatin    Review of Systems   Review of Systems  Gastrointestinal:  Positive for abdominal pain.  All other systems reviewed and are negative.   Physical Exam Updated Vital Signs BP (!) 135/96   Pulse (!) 106   Temp 97.6 F (36.4 C)   Resp 16   Ht '5\' 9"'$  (1.753 m)   Wt 81.6 kg   SpO2 100%   BMI  26.58 kg/m  Physical Exam Vitals and nursing note reviewed.  Constitutional:      General: She is not in acute distress.    Appearance: She is well-developed.  HENT:     Head: Atraumatic.  Eyes:     Conjunctiva/sclera: Conjunctivae normal.  Cardiovascular:     Rate and Rhythm: Normal rate and regular rhythm.  Pulmonary:     Effort: Pulmonary effort is normal.  Abdominal:     Palpations: Abdomen is soft.     Tenderness: There is abdominal tenderness in the suprapubic area. There is no right CVA tenderness, left CVA tenderness, guarding or rebound. Negative signs include Murphy's sign and McBurney's sign.     Hernia: No hernia is present.  Musculoskeletal:     Cervical back: Neck supple.  Skin:    Findings: No rash.  Neurological:     Mental Status: She is alert.  Psychiatric:        Mood and Affect: Mood normal.     ED Results / Procedures / Treatments   Labs (all labs ordered are listed, but only abnormal results are displayed) Labs Reviewed  LIPASE, BLOOD - Abnormal; Notable for the following components:      Result Value   Lipase 64 (*)    All other components within normal limits  COMPREHENSIVE METABOLIC PANEL - Abnormal; Notable for the following components:   Creatinine, Ser 1.04 (*)    Total Protein 8.4  (*)    All other components within normal limits  URINALYSIS, ROUTINE W REFLEX MICROSCOPIC - Abnormal; Notable for the following components:   APPearance HAZY (*)    Protein, ur TRACE (*)    Leukocytes,Ua MODERATE (*)    Bacteria, UA FEW (*)    All other components within normal limits  URINE CULTURE  CBC    EKG None  Radiology CT ABDOMEN PELVIS WO CONTRAST  Result Date: 10/24/2022 CLINICAL DATA:  Acute abdominal pain. EXAM: CT ABDOMEN AND PELVIS WITHOUT CONTRAST TECHNIQUE: Multidetector CT imaging of the abdomen and pelvis was performed following the standard protocol without IV contrast. RADIATION DOSE REDUCTION: This exam was performed according to the departmental dose-optimization program which includes automated exposure control, adjustment of the mA and/or kV according to patient size and/or use of iterative reconstruction technique. COMPARISON:  09/06/2017 FINDINGS: Lower chest: No focal airspace disease. Normal heart size. Hepatobiliary: No suspicious liver lesion on this unenhanced exam. Focal fatty infiltration adjacent to the falciform ligament. Partially distended gallbladder. No calcified gallstone. No biliary dilatation. Pancreas: No ductal dilatation or inflammation. Spleen: Normal in size without focal abnormality. Splenule at the hilum. Adrenals/Urinary Tract: Normal adrenal glands. No hydronephrosis or renal calculi. No perinephric edema. Both ureters are decompressed without ureteral stone. Nondistended urinary bladder. Stomach/Bowel: Moderately distended stomach with ingested material. No gastric wall thickening. There is no small bowel obstruction or inflammatory change. Normal appendix courses posteriorly. Moderate colonic stool burden. No colonic wall thickening. There is transverse colonic redundancy. Vascular/Lymphatic: Normal caliber abdominal aorta. No abdominopelvic adenopathy. Reproductive: The uterus is not seen, presumably surgically absent. No adnexal mass, the  ovaries are not definitively seen. Other: No free air, free fluid, or intra-abdominal fluid collection. Small fat containing umbilical hernia. Musculoskeletal: There are no acute or suspicious osseous abnormalities. IMPRESSION: 1. No acute abnormality in the abdomen/pelvis. 2. Moderate colonic stool burden with transverse colonic redundancy, suggesting constipation. Electronically Signed   By: Keith Rake M.D.   On: 10/24/2022 22:07    Procedures Procedures  Medications Ordered in ED Medications  sodium chloride 0.9 % bolus 1,000 mL (1,000 mLs Intravenous Not Given 10/24/22 2204)    ED Course/ Medical Decision Making/ A&P                           Medical Decision Making Amount and/or Complexity of Data Reviewed Labs: ordered. Radiology: ordered.   BP (!) 135/96   Pulse (!) 106   Temp 97.6 F (36.4 C)   Resp 16   Ht '5\' 9"'$  (1.753 m)   Wt 81.6 kg   SpO2 100%   BMI 26.58 kg/m   23:67 PM  46 year old female significant history of GERD, constipation, bipolar, fibromyalgia's, depression, hypertension presenting with complaint of abdominal pain.  Patient report for the past month she has had intermittent abdominal pain.  Her pain initially started in her back then it manifest more on the left lower quadrant, then right lower quadrant, but for the past 2 days she endorsed pain to her mid abdomen.  Pain is crampy, soreness, hurts when she moves or when she is trying to button her pants.  She did endorse some nausea and vomiting a few days prior but that is since resolved.  She does have history of constipation but states she has some normal stool earlier today.  She is able to pass flatus.  She has history of chronic constipation currently on Linzess.  She does not endorse any urinary discomfort no vaginal bleeding or vaginal discharge.  She has had hysterectomy.  She did not notice any blood per her rectum.  She went to urgent care today for her symptoms but was sent here for further  evaluation.  On exam this is an obese female sitting in bed without any acute discomfort.  She does have some difficulty with laying down and reported worsening abdominal pain.  On exam she has a soft abdomen with suprapubic tenderness but no guarding or rebound tenderness.  Bowel sounds present.  She does not have any CVA tenderness.  Vital signs remarkable for elevated heart rate of 106.  -Labs ordered, independently viewed and interpreted by me.  Labs remarkable for Cr. 1.04, IVF ordered but pt declined.  Lipase 64, likely normal. WBC 7.4.  UA shows moderate leukocytes and 21-50 WBC however pt denies having any urinary sxs.  -The patient was maintained on a cardiac monitor.  I personally viewed and interpreted the cardiac monitored which showed an underlying rhythm of: sinus tachycardia -Imaging independently viewed and interpreted by me and I agree with radiologist's interpretation.  Result remarkable for abd/pelvis CT showing moderate stool burden suggestive of constipation but no other acute changes -This patient presents to the ED for concern of abd pain, this involves an extensive number of treatment options, and is a complaint that carries with it a high risk of complications and morbidity.  The differential diagnosis includes constipation, appendicitis, colitis, diverticulitis, pancreatitis, UTI, pyelonephritis -Co morbidities that complicate the patient evaluation includes constipation -Treatment includes none -Reevaluation of the patient after these medicines showed that the patient improved -PCP office notes or outside notes reviewed -Escalation to admission/observation considered: patients feels much better, is comfortable with discharge, and will follow up with PCP -Prescription medication considered, patient comfortable with Fleet Enema, Miralax and dulcolax -Social Determinant of Health considered which includes social isolation, depression, lack of  transportation         Final Clinical Impression(s) / ED Diagnoses Final diagnoses:  Constipation, unspecified  constipation type    Rx / DC Orders ED Discharge Orders          Ordered    sodium phosphate (FLEET) 7-19 GM/118ML ENEM  Daily PRN        10/24/22 2307    polyethylene glycol powder (GLYCOLAX/MIRALAX) 17 GM/SCOOP powder  Daily        10/24/22 2307              Domenic Moras, PA-C 10/24/22 9102    Tretha Sciara, MD 10/25/22 [phone]

## 2022-10-24 NOTE — ED Notes (Signed)
Patient transported to CT 

## 2022-10-24 NOTE — ED Triage Notes (Signed)
Pt via pov from home with abdominal pain x 1 month. She reports that she has pain when she eats or drinks and that she has only defecated once this month (apparently this is somewhat normal for her - she is on Linzess). Pt alert & oriented, nad noted.

## 2022-10-24 NOTE — ED Notes (Signed)
Reviewed AVS/discharge instruction with patient. Time allotted for and all questions answered. Patient is agreeable for d/c and escorted to ed exit by staff.  

## 2022-10-24 NOTE — Discharge Instructions (Signed)
You have symptoms likely due to constipation.  Please stay hydrated by drinking plenty of fluid, take MiraLAX daily, you may use Fleet enema to help aid with bowel movements.  You may continue using Dulcolax as well.  Follow-up with your doctor for further care.  Return if your symptoms persist or if you have any other concern.

## 2022-10-24 NOTE — Telephone Encounter (Signed)
Please advise 

## 2022-10-25 ENCOUNTER — Telehealth: Payer: Self-pay | Admitting: Adult Health

## 2022-10-25 ENCOUNTER — Ambulatory Visit: Payer: Medicare HMO

## 2022-10-25 DIAGNOSIS — M25561 Pain in right knee: Secondary | ICD-10-CM | POA: Diagnosis not present

## 2022-10-25 DIAGNOSIS — M5459 Other low back pain: Secondary | ICD-10-CM | POA: Diagnosis not present

## 2022-10-25 DIAGNOSIS — R2689 Other abnormalities of gait and mobility: Secondary | ICD-10-CM | POA: Diagnosis not present

## 2022-10-25 DIAGNOSIS — M6281 Muscle weakness (generalized): Secondary | ICD-10-CM | POA: Diagnosis not present

## 2022-10-25 DIAGNOSIS — M25562 Pain in left knee: Secondary | ICD-10-CM | POA: Diagnosis not present

## 2022-10-25 DIAGNOSIS — G8929 Other chronic pain: Secondary | ICD-10-CM | POA: Diagnosis not present
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
## 2022-10-25 NOTE — Telephone Encounter (Signed)
Called pt and she state she is at PT. I advised that I would call back.

## 2022-10-25 NOTE — Telephone Encounter (Signed)
Spoke to pt and she stated that she would like to be referred to urology due to abnormal urinalysis. Pt advised that message would be sent to [facility].

## 2022-10-25 NOTE — Telephone Encounter (Signed)
Pt went to the ED last night and would like a call back to go over results, and she is also requesting a referral to see the Urologist.   Pt needs a call back as soon as possible because she was told she needed IV and she refused.    Pt is asking for advice &/or information.

## 2022-10-26 ENCOUNTER — Telehealth: Payer: Self-pay | Admitting: Primary Care

## 2022-10-26 ENCOUNTER — Encounter: Payer: Self-pay | Admitting: Primary Care

## 2022-10-26 ENCOUNTER — Telehealth: Payer: Self-pay | Admitting: Adult Health

## 2022-10-26 ENCOUNTER — Telehealth: Payer: Self-pay | Admitting: Gastroenterology

## 2022-10-26 ENCOUNTER — Ambulatory Visit (INDEPENDENT_AMBULATORY_CARE_PROVIDER_SITE_OTHER): Payer: Medicare HMO | Admitting: Primary Care

## 2022-10-26 VITALS — BP 132/62 | HR 98 | Temp 98.4°F | Ht 69.0 in | Wt 174.8 lb

## 2022-10-26 DIAGNOSIS — R058 Other specified cough: Secondary | ICD-10-CM | POA: Diagnosis not present

## 2022-10-26 DIAGNOSIS — R0683 Snoring: Secondary | ICD-10-CM

## 2022-10-26 DIAGNOSIS — G4700 Insomnia, unspecified: Secondary | ICD-10-CM | POA: Diagnosis not present

## 2022-10-26 DIAGNOSIS — K219 Gastro-esophageal reflux disease without esophagitis: Secondary | ICD-10-CM

## 2022-10-26 DIAGNOSIS — R052 Subacute cough: Secondary | ICD-10-CM

## 2022-10-26 LAB — URINE CULTURE: Culture: NO GROWTH

## 2022-10-26 MED ORDER — FLUTICASONE PROPIONATE HFA 110 MCG/ACT IN AERO: 2.0000 | INHALATION_SPRAY | Freq: Two times a day (BID) | RESPIRATORY_TRACT | 1 refills | Status: DC

## 2022-10-26 MED ORDER — CHLORPHENIRAMINE MALEATE 4 MG PO TABS: 4.0000 mg | ORAL_TABLET | ORAL | 0 refills | Status: DC | PRN

## 2022-10-26 NOTE — Assessment & Plan Note (Addendum)
-  Treated for suspected PNA in November. Coughing symptoms improved after abx/prednisone. Cough is productive with clear mucus, worse at night. CXR 10/10/22 showed clear lungs. Recommend she increase Flovent hfa two twice daily. RX chlorpheniramine '4mg'$  tablet q 4-6 hours for cough. Continue astelin and flonase nasal spray.

## 2022-10-26 NOTE — Telephone Encounter (Signed)
Please advise 

## 2022-10-26 NOTE — Telephone Encounter (Signed)
Patient notified of update  and verbalized understanding. 

## 2022-10-26 NOTE — Assessment & Plan Note (Signed)
-   Reflux felt to be contributing to cough. Continue PPI and H2 blocker. Advised she elevate head 30 degrees while sleeping, follow GERD diet and avoid eating 2 hours prior to sleeping.

## 2022-10-26 NOTE — Patient Instructions (Addendum)
Orders: Continue Prilosec and pepcid Continue astelin and flonase nasal spray Continue mucinex '500mg'$  twice daily  Increase Flovent two puffs twice daily Start chlorpheniramine '4mg'$  tablet every 4-6 hours for cough  Sleep with head elevated, recommend you get wedge pillow to use a night Do not eat 2 hours prior to going to bed   Orders: Sleep study   Follow-up: Dr. Melvyn Novas in 6-8 weeks    Food Choices for Gastroesophageal Reflux Disease, Adult When you have gastroesophageal reflux disease (GERD), the foods you eat and your eating habits are very important. Choosing the right foods can help ease your discomfort. Think about working with a food expert (dietitian) to help you make good choices. What are tips for following this plan? Reading food labels Look for foods that are low in saturated fat. Foods that may help with your symptoms include: Foods that have less than 5% of daily value (DV) of fat. Foods that have 0 grams of trans fat. Cooking Do not fry your food. Cook your food by baking, steaming, grilling, or broiling. These are all methods that do not need a lot of fat for cooking. To add flavor, try to use herbs that are low in spice and acidity. Meal planning  Choose healthy foods that are low in fat, such as: Fruits and vegetables. Whole grains. Low-fat dairy products. Lean meats, fish, and poultry. Eat small meals often instead of eating 3 large meals each day. Eat your meals slowly in a place where you are relaxed. Avoid bending over or lying down until 2-3 hours after eating. Limit high-fat foods such as fatty meats or fried foods. Limit your intake of fatty foods, such as oils, butter, and shortening. Avoid the following as told by your doctor: Foods that cause symptoms. These may be different for different people. Keep a food diary to keep track of foods that cause symptoms. Alcohol. Drinking a lot of liquid with meals. Eating meals during the 2-3 hours before  bed. Lifestyle Stay at a healthy weight. Ask your doctor what weight is healthy for you. If you need to lose weight, work with your doctor to do so safely. Exercise for at least 30 minutes on 5 or more days each week, or as told by your doctor. Wear loose-fitting clothes. Do not smoke or use any products that contain nicotine or tobacco. If you need help quitting, ask your doctor. Sleep with the head of your bed higher than your feet. Use a wedge under the mattress or blocks under the bed frame to raise the head of the bed. Chew sugar-free gum after meals. What foods should eat?  Eat a healthy, well-balanced diet of fruits, vegetables, whole grains, low-fat dairy products, lean meats, fish, and poultry. Each person is different. Foods that may cause symptoms in one person may not cause any symptoms in another person. Work with your doctor to find foods that are safe for you. The items listed above may not be a complete list of what you can eat and drink. Contact a food expert for more options. What foods should I avoid? Limiting some of these foods may help in managing the symptoms of GERD. Everyone is different. Talk with a food expert or your doctor to help you find the exact foods to avoid, if any. Fruits Any fruits prepared with added fat. Any fruits that cause symptoms. For some people, this may include citrus fruits, such as oranges, grapefruit, pineapple, and lemons. Vegetables Deep-fried vegetables. [place] fries. Any vegetables prepared  with added fat. Any vegetables that cause symptoms. For some people, this may include tomatoes and tomato products, chili peppers, onions and garlic, and horseradish. Grains Pastries or quick breads with added fat. Meats and other proteins High-fat meats, such as fatty beef or pork, hot dogs, ribs, ham, sausage, salami, and bacon. Fried meat or protein, including fried fish and fried chicken. Nuts and nut butters, in large amounts. Dairy Whole milk and  chocolate milk. Sour cream. Cream. Ice cream. Cream cheese. Milkshakes. Fats and oils Butter. Margarine. Shortening. Ghee. Beverages Coffee and tea, with or without caffeine. Carbonated beverages. Sodas. Energy drinks. Fruit juice made with acidic fruits, such as orange or grapefruit. Tomato juice. Alcoholic drinks. Sweets and desserts Chocolate and cocoa. Donuts. Seasonings and condiments Pepper. Peppermint and spearmint. Added salt. Any condiments, herbs, or seasonings that cause symptoms. For some people, this may include curry, hot sauce, or vinegar-based salad dressings. The items listed above may not be a complete list of what you should not eat and drink. Contact a food expert for more options. Questions to ask your doctor Diet and lifestyle changes are often the first steps that are taken to manage symptoms of GERD. If diet and lifestyle changes do not help, talk with your doctor about taking medicines. Where to find more information International [employer] for Gastrointestinal [employer]: aboutgerd.org Summary When you have GERD, food and lifestyle choices are very important in easing your symptoms. Eat small meals often instead of 3 large meals a day. Eat your meals slowly and in a place where you are relaxed. Avoid bending over or lying down until 2-3 hours after eating. Limit high-fat foods such as fatty meats or fried foods. This information is not intended to replace advice given to you by your health care provider. Make sure you discuss any questions you have with your health care provider. Document Revised: 05/10/2020 Document Reviewed: 05/10/2020 Elsevier Patient Education  [place].

## 2022-10-26 NOTE — Telephone Encounter (Signed)
Patient was sent to triage for severe abdominal pain, loss of appetite, having thirst, having pain when she eats or drinks. Pain is on lower left side. Patient was seen in ED two days ago and was told she was constipated but she is already on Linzess and stool softeners. Patient stated this is different then pain she gets when constipated.       Patient is refusing ER outcome       Please advise

## 2022-10-26 NOTE — Telephone Encounter (Signed)
Patient is calling states she just had an ED visit and she is having sever pain and constipation and is seeking advice on what to do says she can't wait until January appt.

## 2022-10-26 NOTE — Assessment & Plan Note (Signed)
-   Patient has symptoms of loud snoring, restless sleep, insomnia, nightmares and restless leg syndrome.  Epworth score is 2.  Patient was ordered for in lab sleep study due to concerns for OSA and RLS.  This was scheduled however patient could not attend. We will check about rescheduling this.

## 2022-10-26 NOTE — Assessment & Plan Note (Deleted)
-  Treated for suspected PNA in November. Coughing symptoms improved after abx/prednisone. Cough is productive with clear mucus, worse at night. CXR 10/10/22 showed clear lungs. Recommend she increase Flovent hfa two twice daily. RXC chlorpheniramine '4mg'$  tablet q 4-6 hours for cough.
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
## 2022-10-26 NOTE — Telephone Encounter (Signed)
Do we still have approved for PSG, can we reschedule or does she need a new order?

## 2022-10-27 NOTE — Telephone Encounter (Signed)
Left message on machine to call back  

## 2022-10-27 NOTE — Telephone Encounter (Signed)
The pt has been seen for constipation at the ED as well as PCP.  She is using enemas and miralax. As well as trulance.  She is moving her bowels some but wants to be seen.  She has an appt for 1/5 with Amy. She will keep that and also has an appt with PCP next week.  She will call back with any further concerns.

## 2022-10-27 NOTE — Telephone Encounter (Signed)
Caller states she is experiencing dehydration, severe abdominal pain, loss of appetite and thirst. It started a while ago. She has been dealing with the pain. She went to the ER 2 days ago. They did a urine sample. Some of the things were on the high end. Her PCP was waiting for her records to come back .She was constipated. Linzess & stool softener. They said to do an enema every 24 hours. Nurse said she may need to see a Urologist. They could not get an IV in. They tried 6 times before she told them to stop. They could not do the contrast with the MRI. They had to get blood through her finger. She has dealt with constipation before but nothing this bad. It hurts to eat or drink. Pain is constant & rates it as 10/10. Lower left side.  10/26/2022 2:19:59 PM Go to ED Now Lovelace, RN, Amy  Comments User: Wayne Sever, RN Date/Time Molly [name] Time): 10/26/2022 2:24:01 PM Notified [name] of patient condition & refusal to go to ER. She is sending a message to the provider & they will call the pt. back.  Referrals GO TO FACILITY REFUSED  Pt also sent message to her GI & is awaiting response. PCP out of office today - will return on Tues 10/31/22.

## 2022-10-30 DIAGNOSIS — F315 Bipolar disorder, current episode depressed, severe, with psychotic features: Secondary | ICD-10-CM | POA: Diagnosis not present

## 2022-10-30 DIAGNOSIS — F209 Schizophrenia, unspecified: Secondary | ICD-10-CM | POA: Diagnosis not present

## 2022-10-30 DIAGNOSIS — F4310 Post-traumatic stress disorder, unspecified: Secondary | ICD-10-CM | POA: Diagnosis not present

## 2022-10-31 NOTE — Telephone Encounter (Signed)
It looks like the in lab sleep study was scheduled for the patient on 02/03/22 for 03/07/22. Patient called and CXL on 03/06/22. I don't know if Molly [name] was done or is still good.
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
## 2022-11-02 ENCOUNTER — Other Ambulatory Visit: Payer: Self-pay | Admitting: Adult Health

## 2022-11-02 NOTE — Telephone Encounter (Signed)
Okay for refill?  

## 2022-11-02 NOTE — Telephone Encounter (Signed)
Left message to return phone call.

## 2022-11-03 ENCOUNTER — Ambulatory Visit: Payer: Medicare HMO

## 2022-11-10 NOTE — Telephone Encounter (Signed)
I spoke to pt to confirm she will have [employer] next year.  Pt confirmed this.  I will need to work on this [place] next year.  Pt verbalized understanding.

## 2022-11-16 ENCOUNTER — Other Ambulatory Visit: Payer: Self-pay | Admitting: *Deleted

## 2022-11-16 DIAGNOSIS — M79606 Pain in leg, unspecified: Secondary | ICD-10-CM
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
## 2022-11-17 ENCOUNTER — Ambulatory Visit (INDEPENDENT_AMBULATORY_CARE_PROVIDER_SITE_OTHER): Payer: Medicare Other | Admitting: Physician Assistant

## 2022-11-17 ENCOUNTER — Ambulatory Visit: Payer: Medicare HMO | Attending: Orthopaedic Surgery

## 2022-11-17 ENCOUNTER — Encounter: Payer: Self-pay | Admitting: Physician Assistant

## 2022-11-17 VITALS — BP 118/78 | HR 102 | Ht 69.0 in | Wt 176.0 lb

## 2022-11-17 DIAGNOSIS — M25562 Pain in left knee: Secondary | ICD-10-CM | POA: Diagnosis not present

## 2022-11-17 DIAGNOSIS — G8929 Other chronic pain: Secondary | ICD-10-CM | POA: Insufficient documentation

## 2022-11-17 DIAGNOSIS — M25552 Pain in left hip: Secondary | ICD-10-CM | POA: Diagnosis not present

## 2022-11-17 DIAGNOSIS — M5459 Other low back pain: Secondary | ICD-10-CM | POA: Diagnosis not present

## 2022-11-17 DIAGNOSIS — M25551 Pain in right hip: Secondary | ICD-10-CM | POA: Diagnosis not present

## 2022-11-17 DIAGNOSIS — K5909 Other constipation: Secondary | ICD-10-CM | POA: Diagnosis not present

## 2022-11-17 DIAGNOSIS — R296 Repeated falls: Secondary | ICD-10-CM | POA: Diagnosis present

## 2022-11-17 DIAGNOSIS — R262 Difficulty in walking, not elsewhere classified: Secondary | ICD-10-CM | POA: Insufficient documentation

## 2022-11-17 DIAGNOSIS — M25561 Pain in right knee: Secondary | ICD-10-CM | POA: Diagnosis not present

## 2022-11-17 DIAGNOSIS — R2689 Other abnormalities of gait and mobility: Secondary | ICD-10-CM | POA: Insufficient documentation

## 2022-11-17 DIAGNOSIS — M6281 Muscle weakness (generalized): Secondary | ICD-10-CM | POA: Insufficient documentation

## 2022-11-17 DIAGNOSIS — K219 Gastro-esophageal reflux disease without esophagitis: Secondary | ICD-10-CM

## 2022-11-17 MED ORDER — LINACLOTIDE 290 MCG PO CAPS: 290.0000 ug | ORAL_CAPSULE | Freq: Every day | ORAL | 6 refills | Status: DC

## 2022-11-17 MED ORDER — OMEPRAZOLE 40 MG PO CPDR: 40.0000 mg | DELAYED_RELEASE_CAPSULE | Freq: Every day | ORAL | 11 refills | Status: DC

## 2022-11-17 MED ORDER — FAMOTIDINE 20 MG PO TABS: 20.0000 mg | ORAL_TABLET | Freq: Every day | ORAL | 11 refills | Status: DC

## 2022-11-17 NOTE — Patient Instructions (Signed)
Amy Esterwood PA-C recommends that you complete a bowel purge (to clean out your bowels). Please do the following: Purchase a bottle of Miralax over the counter as well as a box of 5 mg dulcolax tablets. Take 4 dulcolax tablets. Wait 1 hour. You will then drink 6-8 capfuls of Miralax mixed in an adequate amount of water/juice/gatorade (you may choose which of these liquids to drink) over the next 2-3 hours. You should expect results within 1 to 6 hours after completing the bowel purge.   Continue your Miralax 17grams in 8 oz of water daily.   We have sent the following medications to your pharmacy for you to pick up at your convenience: Linzess- try the samples first, pepcid, omeprazole  Call back and speak to Beth (Amy's nurse) to let her know if the linzess is working.   I appreciate the opportunity to care for you. Amy Esterwood, PA-C

## 2022-11-17 NOTE — Progress Notes (Signed)
Subjective:    Patient ID: Molly [name], female    DOB: 07-22-1976, 47 y.o.   MRN: [id number]  HPI Chavy is a 47 year old African-American female, established with Dr. Ardis Hughs, last seen in 2021 who comes in today for medication refills.  She also has complaints of ongoing issues with fairly severe chronic constipation. She has history of fibromyalgia, bipolar disorder, seizure disorder, osteoarthritis, GERD, and migraines.  She is on multiple psychotropics. She had been managing her chronic GERD symptoms with omeprazole 40 mg at nighttime and Pepcid 20 mg every morning.  She did have a short period of time where she was off of medications because she had run out.  When she is on medication symptoms are fairly well-controlled. She does have fairly chronic issues with nausea without vomiting. She has had severe problems with constipation.  Had an ER visit on 10/24/2022 with severe constipation and CT imaging at that time showed some focal fatty infiltration adjacent to the falciform ligament, moderately distended stomach with ingested material no gastric wall thickening, no obstruction or inflammatory changes, moderate stool burden and redundant transverse colon. Did a MiraLAX purge after that ER visit and has been taking MiraLAX 17 g in 8 ounces of water twice daily patient to using a Dulcolax suppository or a fleets enema on a daily basis and with that regimen has been having a bowel movement about every other day but usually still with hard stools. She had previously been on Trulance but does not recall that working well and has not had it recently.  Had taken Linzess in the past but again has not been on that over the past couple of years.  She is on multiple medications associated with constipation. Last colonoscopy November 2018 here which was normal other than internal and external hemorrhoids.  She had a remote EGD apparently done through the [place] which showed some mild distal  esophagitis.  Review of Systems Pertinent positive and negative review of systems were noted in the above HPI section.  All other review of systems was otherwise negative.   Outpatient Encounter Medications as of 11/17/2022  Medication Sig   albuterol (VENTOLIN HFA) 108 (90 Base) MCG/ACT inhaler Inhale 2 puffs into the lungs every 6 (six) hours as needed for wheezing or shortness of breath.   amantadine (SYMMETREL) 100 MG capsule Take 100 mg by mouth daily.   amLODipine (NORVASC) 5 MG tablet Take 1 tablet (5 mg total) by mouth daily.   ammonium lactate (LAC-HYDRIN) 12 % lotion APPLY TO AFFECTED AREA(S) AS NEEDED FOR DRY SKIN   atorvastatin (LIPITOR) 40 MG tablet Take 1 tablet (40 mg total) by mouth daily.   Azelaic Acid 15 % cream    azelastine (ASTELIN) 0.1 % nasal spray USE 1 SPRAY INTO EACH NOSTRIL TWICE A DAY   azelastine (OPTIVAR) 0.05 % ophthalmic solution INSTILL 1 DROP INTO BOTH EYES TWICE A DAY   benztropine (COGENTIN) 1 MG tablet Take 1 mg by mouth daily.   CAPLYTA 42 MG CAPS    carbamazepine (TEGRETOL) 200 MG tablet Take 1 tablet (200 mg total) by mouth 3 (three) times daily.   cetirizine (ZYRTEC) 10 MG tablet Take 1 tablet (10 mg total) by mouth daily. **DUE FOR YEARLY PHYSICAL**   chlorpheniramine (CHLOR-TRIMETON) 4 MG tablet Take 1 tablet (4 mg total) by mouth every 4 (four) hours as needed for allergies.   cyclobenzaprine (FLEXERIL) 10 MG tablet Take 10 mg by mouth 3 (three) times daily as needed for muscle spasms.  diclofenac Sodium (VOLTAREN) 1 % GEL APPLY 2 GRAMS TO AFFECTED AREA FOUR TIMES DAILY   EPINEPHrine (EPIPEN 2-PAK) 0.3 mg/0.3 mL IJ SOAJ injection Inject 0.3 mg into the muscle as needed for anaphylaxis.   Erenumab-aooe (AIMOVIG) 140 MG/ML SOAJ INJECT '140MG'$  INTO THE SKIN EVERY MONTH   famotidine (PEPCID) 20 MG tablet Take 1 tablet (20 mg total) by mouth at bedtime.   famotidine (PEPCID) 20 MG tablet Take 1 tablet (20 mg total) by mouth daily before breakfast.    fluticasone (FLONASE) 50 MCG/ACT nasal spray SPRAY 2 SPRAYS INTO EACH NOSTRIL EVERY DAY   fluticasone (FLOVENT HFA) 110 MCG/ACT inhaler Inhale 2 puffs into the lungs in the morning and at bedtime.   haloperidol (HALDOL) 5 MG tablet Take 5 mg by mouth 2 (two) times daily.   ibuprofen (ADVIL) 800 MG tablet TAKE 1 TABLET BY MOUTH EVERY 8 HOURS AS NEEDED   linaclotide (LINZESS) 290 MCG CAPS capsule Take 1 capsule (290 mcg total) by mouth daily before breakfast.   Multiple Vitamin (MULTIVITAMIN) capsule Take 1 capsule by mouth daily.   OLANZapine (ZYPREXA) 10 MG tablet Take 10 mg by mouth at bedtime.   omeprazole (PRILOSEC) 40 MG capsule Take 1 capsule (40 mg total) by mouth daily.   omeprazole (PRILOSEC) 40 MG capsule Take 1 capsule (40 mg total) by mouth daily before supper.   ondansetron (ZOFRAN-ODT) 4 MG disintegrating tablet Take 1 tablet (4 mg total) by mouth every 8 (eight) hours as needed for nausea or vomiting (for nausea from wegovy or other source).   Plecanatide (TRULANCE) 3 MG TABS Take 1 tablet by mouth daily.   polyethylene glycol powder (GLYCOLAX/MIRALAX) 17 GM/SCOOP powder Take 17 g by mouth daily.   QUEtiapine (SEROQUEL) 400 MG tablet TAKE 1 TABLET (400 MG TOTAL) BY MOUTH AT BEDTIME.   sodium phosphate (FLEET) 7-19 GM/118ML ENEM Place 133 mLs (1 enema total) rectally daily as needed for severe constipation.   SUMAtriptan (IMITREX) 20 MG/ACT nasal spray Both Nares 1-2 Times Daily AS Needed   topiramate (TOPAMAX) 200 MG tablet Take 1 tablet twice a day   TRINTELLIX 10 MG TABS tablet Take 10 mg by mouth daily.   zinc gluconate 50 MG tablet Take 50 mg by mouth daily.   zolmitriptan (ZOMIG) 5 MG nasal solution PLACE 1 SPRAY INTO THE NOSE AS NEEDED FOR MIGRAINE.   zolpidem (AMBIEN) 10 MG tablet TAKE 1 TABLET BY MOUTH EVERY DAY AT BEDTIME AS NEEDED   [DISCONTINUED] Calcium Carbonate-Vit D-Min (CALCIUM 1200 PO) Take by mouth.   [DISCONTINUED] gabapentin (NEURONTIN) 300 MG capsule Take 1  capsule (300 mg total) by mouth 2 (two) times daily. Start with 1 capsule daily at bedtime for 7 days, then increase to 1 capsule twice a day. (Patient not taking: Reported on 10/03/2022)   [DISCONTINUED] NONFORMULARY OR COMPOUNDED [place]: Antiinflammatory cream - Diclofenac 3%, Baclofen 2%, Lidocaine 2%, apply 1-2 grams to affected 3-4 times daily.   No facility-administered encounter medications on file as of 11/17/2022.   Allergies  Allergen Reactions   Fish Allergy Anaphylaxis, Hives and Swelling   Benzyl Alcohol Hives   Heparin Itching   Coumadin [Warfarin Sodium] Hives   Crestor [Rosuvastatin] Itching   Doxycycline Nausea And Vomiting   Methocarbamol Nausea Only   Phenergan [Promethazine Hcl] Swelling   Toradol [Ketorolac Tromethamine] Hives   Nystatin Itching and Rash    Blisters   Patient Active Problem List   Diagnosis Date Noted   Low back pain  09/28/2022   Arthritis of left hip 05/03/2022   Loud snoring 01/30/2022   Bilateral chronic knee pain 01/19/2022   Cough 12/29/2021   Lesion of external ear canal, right 08/25/2020   Upper airway cough syndrome 04/22/2020   Trigger thumb, left thumb 08/12/2019   Trigger finger, left little finger 08/12/2019   Primary osteoarthritis of right hip 08/12/2019   Insomnia 05/25/2016   Memory loss 09/29/2015   Pure hypercholesterolemia 06/30/2015   Convulsion ([place]) 06/22/2015   Cephalalgia 06/22/2015   Chest pain at rest 06/22/2015   Bipolar disorder ([place]) 12/05/2013   GERD (gastroesophageal reflux disease) 12/05/2013   Fibromyalgia 12/05/2013   Carpal tunnel syndrome 12/05/2013   Osteoarthritis 12/05/2013   Convulsions/seizures ([place]) 10/08/2013   Migraine without aura 10/08/2013   Abdominal pain 09/29/2013   Chronic pain 09/29/2013   Postsurgical menopause 09/29/2013   Seasonal allergies 09/29/2013   Depression 09/29/2013   Seizure disorder ([place]) 09/29/2013   Hypertrophic scar of skin 04/22/2013   Social  History   Socioeconomic History   Marital status: Divorced    Spouse name: Not on file   Number of children: 2   Years of education: 12+   Highest education level: Not on file  Occupational History   Occupation: unemployed  Tobacco Use   Smoking status: Never   Smokeless tobacco: Never  Vaping Use   Vaping Use: Never used  Substance and Sexual Activity   Alcohol use: No    Alcohol/week: 0.0 standard drinks of alcohol   Drug use: No   Sexual activity: Yes  Other Topics Concern   Not on file  Social History Narrative   Is not currently working   Patient lives at home. A two story home   Patient has 2 children.    Patient has a college education.    Patient is right handed.    Social Determinants of Health   Financial Resource Strain: Low Risk  (12/12/2021)   Overall Financial Resource Strain (CARDIA)    Difficulty of Paying Living Expenses: Not hard at all  Food Insecurity: No Food Insecurity (12/12/2021)   Hunger Vital Sign    Worried About Running Out of Food in the Last Year: Never true    Ran Out of Food in the Last Year: Never true  Transportation Needs: Unmet Transportation Needs (12/12/2021)   PRAPARE - Hydrologist (Medical): Yes    Lack of Transportation (Non-Medical): Yes  Physical Activity: Inactive (12/12/2021)   Exercise Vital Sign    Days of Exercise per Week: 0 days    Minutes of Exercise per Session: 0 min  Stress: Stress Concern Present (12/12/2021)   [place]    Feeling of Stress : Very much  Social Connections: Socially Isolated (12/12/2021)   Social Connection and Isolation Panel [NHANES]    Frequency of Communication with Friends and Family: More than three times a week    Frequency of Social Gatherings with Friends and Family: More than three times a week    Attends Religious Services: Never    Marine scientist or Organizations: No    Attends English as a second language teacher Meetings: Never    Marital Status: Never married  Intimate Partner Violence: Not At Risk (12/12/2021)   Humiliation, Afraid, Rape, and Kick questionnaire    Fear of Current or Ex-Partner: No    Emotionally Abused: No    Physically Abused: No    Sexually Abused:  No    Ms. Sestak family history includes Bipolar disorder in her sister; Diabetes in her maternal aunt; Emphysema in her father; Seizures in her maternal grandmother, mother, and sister.      Objective:    Vitals:   11/17/22 1115  BP: 118/78  Pulse: (!) 102  SpO2: 98%    Physical Exam Well-developed well-nourished African-American female in no acute distress.  Height, Weight, 176 BMI 25.9  HEENT; nontraumatic normocephalic, EOMI, PE R LA, sclera anicteric. Oropharynx; not examined today Neck; supple, no JVD Cardiovascular; regular rate and rhythm with S1-S2, no murmur rub or gallop Pulmonary; Clear bilaterally Abdomen; soft, there is some very mild diffuse tenderness, no guarding or rebound nondistended, no palpable mass or hepatosplenomegaly, bowel sounds are active Rectal; not done today Skin; benign exam, no jaundice rash or appreciable lesions Extremities; no clubbing cyanosis or edema skin warm and dry Neuro/Psych; alert and oriented x4, grossly nonfocal mood and affect appropriate        Assessment & Plan:   #43 47 year old female with history of chronic GERD-stable on omeprazole 40 every afternoon and Pepcid 20 mg every morning. Rediscussed antireflux diet and regimen. #2 chronic constipation-recent ER visit December 2023 with severe obstipation. On multiple contributing medications  #3 colon cancer screening-up-to-date with negative colonoscopy November 2018 with exception of internal and external hemorrhoids #4 seizure disorder 5.  Bipolar disorder 6.  Fibromyalgia  Plan; continue omeprazole but changed to 40 mg AC dinner refill x 1 year Continue Pepcid 20 mg p.o. every morning AC  breakfast refill x 1 year Patient advised to do another MiraLAX/Gatorade purge this weekend, then continue MiraLAX 17 g in 8 ounces of water twice daily Can continue Dulcolax suppositories and/or fleets enema daily or every other day as needed Will restart trial of Linzess at 290 mcg, prescription was sent today and patient was given samples.  She is asked to try this over the next couple of weeks, if helpful then continue daily.  If not helpful she is asked to call back and would consider trial of Motegrity at that point.  Patient will be established with Dr. Rush Landmark  in Dr. Eugenia Pancoast absence  Kelsi Benham Genia Harold PA-C 11/17/2022   Cc: Dorothyann Peng, NP

## 2022-11-18 NOTE — Progress Notes (Signed)
Attending Physician's Attestation   I have reviewed the chart.   I agree with the Advanced Practitioner's note, impression, and recommendations with any updates as below. Hopefully constipation will be less of an issue for her as outlined in plan.  Even in setting of relatively stable GERD symptoms, if EGD greater than 10 years ago, may want to consider updated EGD to ensure no evidence of Barrett's esophagus (this can be rediscussed in the future).   Justice Britain, MD [facility] Office # [phone]

## 2022-11-24 ENCOUNTER — Ambulatory Visit (INDEPENDENT_AMBULATORY_CARE_PROVIDER_SITE_OTHER): Payer: Medicare HMO | Admitting: Physician Assistant

## 2022-11-24 ENCOUNTER — Ambulatory Visit (HOSPITAL_COMMUNITY)
Admission: RE | Admit: 2022-11-24 | Discharge: 2022-11-24 | Disposition: A | Discharge status: Home / Self Care | Payer: Medicare Other | Source: Ambulatory Visit | Attending: Surgery | Admitting: Surgery

## 2022-11-24 VITALS — BP 142/98 | HR 101 | Temp 97.8°F | Resp 20 | Ht 69.0 in | Wt 172.8 lb

## 2022-11-24 DIAGNOSIS — I872 Venous insufficiency (chronic) (peripheral): Secondary | ICD-10-CM

## 2022-11-24 DIAGNOSIS — R29898 Other symptoms and signs involving the musculoskeletal system: Secondary | ICD-10-CM | POA: Diagnosis not present

## 2022-11-24 DIAGNOSIS — M79606 Pain in leg, unspecified: Secondary | ICD-10-CM | POA: Insufficient documentation

## 2022-11-24 DIAGNOSIS — M79605 Pain in left leg: Secondary | ICD-10-CM
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
## 2022-11-29 ENCOUNTER — Encounter: Payer: Self-pay | Admitting: Physical Therapy

## 2022-11-29 ENCOUNTER — Ambulatory Visit: Payer: Medicare HMO | Admitting: Physical Therapy

## 2022-11-29 ENCOUNTER — Other Ambulatory Visit: Payer: Self-pay

## 2022-11-29 DIAGNOSIS — M6281 Muscle weakness (generalized): Secondary | ICD-10-CM | POA: Diagnosis not present

## 2022-11-29 DIAGNOSIS — R2689 Other abnormalities of gait and mobility: Secondary | ICD-10-CM | POA: Diagnosis not present

## 2022-11-29 DIAGNOSIS — M25562 Pain in left knee: Secondary | ICD-10-CM | POA: Diagnosis not present

## 2022-11-29 DIAGNOSIS — M5459 Other low back pain: Secondary | ICD-10-CM | POA: Diagnosis not present

## 2022-11-29 DIAGNOSIS — G8929 Other chronic pain: Secondary | ICD-10-CM | POA: Diagnosis not present

## 2022-11-29 DIAGNOSIS — R296 Repeated falls: Secondary | ICD-10-CM

## 2022-11-29 DIAGNOSIS — M25552 Pain in left hip: Secondary | ICD-10-CM | POA: Diagnosis not present

## 2022-11-29 DIAGNOSIS — M25551 Pain in right hip: Secondary | ICD-10-CM | POA: Diagnosis not present

## 2022-11-29 DIAGNOSIS — R262 Difficulty in walking, not elsewhere classified: Secondary | ICD-10-CM

## 2022-11-29 DIAGNOSIS — M25561 Pain in right knee: Secondary | ICD-10-CM | POA: Diagnosis not present

## 2022-12-08 ENCOUNTER — Ambulatory Visit: Payer: Medicare HMO

## 2022-12-13 NOTE — Telephone Encounter (Signed)
Pt has medicare & medicaid - no pa req.  Molly [name] is scheduling.

## 2022-12-14 ENCOUNTER — Telehealth (INDEPENDENT_AMBULATORY_CARE_PROVIDER_SITE_OTHER): Payer: Medicare HMO | Admitting: Family Medicine

## 2022-12-14 VITALS — Ht 69.0 in | Wt 172.8 lb

## 2022-12-14 DIAGNOSIS — Z0000 Encounter for general adult medical examination without abnormal findings: Secondary | ICD-10-CM | POA: Diagnosis not present

## 2022-12-14 NOTE — Patient Instructions (Addendum)
I really enjoyed getting to talk with you today! I am available on Tuesdays and Thursdays for virtual visits if you have any questions or concerns, or if I can be of any further assistance.   CHECKLIST FROM ANNUAL WELLNESS VISIT:  -Follow up (please call to schedule if not scheduled after visit):  -Inperson visit with your Primary Doctor office: every 3-4 months -yearly for annual wellness visit with primary care office  Here is a list of your preventive care/health maintenance measures and the plan for each if any are due:  Health Maintenance  Topic Date Due   COVID-19 Vaccine (1) Never done   Hepatitis C Screening  Never done   Medicare Annual Wellness (AWV)  12/12/2022   INFLUENZA VACCINE  02/11/2023 (Originally 06/13/2022)   DTaP/Tdap/Td (2 - Td or Tdap) 06/27/2027   HIV Screening  Completed   HPV VACCINES  Aged Out    -See a dentist at least yearly  -Get your eyes checked and then per your eye specialist's recommendations  -Other issues addressed today:  -I have included below further information regarding a healthy whole foods based diet, physical activity guidelines for adults, stress management and opportunities for social connections. I hope you find this information useful.     NUTRITION: -eat real food: lots of colorful vegetables (half the plate) and fruits -5-7 servings of vegetables and fruits per day (fresh or steamed is best), exp. 2 servings of vegetables with lunch and dinner and 2 servings of fruit per day. Berries and greens such as kale and collards are great choices.  -consume on a regular basis: whole grains (make sure first ingredient on label contains the word "whole"), fresh fruits, fish, nuts, seeds, healthy  oils (such as olive oil, avocado oil, grape seed oil) -may eat small amounts of dairy and lean meat on occasion, but avoid processed meats such as ham, bacon, lunch meat, etc. -drink water -try to avoid fast food and pre-packaged foods, processed meat -most experts advise limiting sodium to < '2300mg'$  per day, should limit further is any chronic conditions such as high blood pressure, heart disease, diabetes, etc. The American Heart Association advised that < '1500mg'$  is is ideal -try to avoid foods that contain any ingredients with names you do not recognize  -try to avoid sugar/sweets (except for the natural sugar that occurs in fresh fruit) -try to avoid sweet drinks -try to avoid white rice, white bread, pasta (unless whole grain), white or yellow potatoes  EXERCISE GUIDELINES FOR ADULTS: -if you wish to increase your physical activity, do so gradually and with the approval of your doctor -STOP and seek medical care immediately if you have any chest pain, chest discomfort or trouble breathing when starting or increasing exercise  -move and stretch your body, legs, feet and arms when sitting for long periods -Physical activity guidelines for optimal health in adults: -least 150 minutes per week of aerobic exercise (can talk, but not sing) once approved by your doctor, 20-30 minutes of sustained activity or two 10 minute episodes of sustained activity every day.  -resistance training at least 2 days per week if approved by your doctor -balance exercises 3+ days per week:   Stand somewhere where you have something sturdy to hold onto if you lose balance.    1) lift up on toes, start with 5x per day and work up to 20x   2) stand and lift on leg straight out to the side so that foot is a few inches  of the floor, start with 5x each side and work up to 20x each side   3) stand on one foot, start with 5 seconds each side and work up to 20 seconds on each side  If you need ideas or help with getting  more active:  -Walk with a Doc: [email or web address]  -try to include resistance (weight lifting/strength building) and balance exercises twice per week: or the following link for ideas: ChessContest.fr  [email or web address]  STRESS MANAGEMENT: -can try meditating, or just sitting quietly with deep breathing while intentionally relaxing all parts of your body for 5 minutes daily -if you need further help with stress, anxiety or depression please follow up with your primary doctor or contact the wonderful folks at [facility]: [place]: -options in [place] if you wish to engage in more social and exercise related activities:  -Walk with a Doc: [email or web address]  -YouTube has lots of exercise videos for different ages and abilities as well  -consider volunteering at a school, hospice center, church, senior center or elsewhere
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
## 2022-12-16 ENCOUNTER — Other Ambulatory Visit: Payer: Self-pay | Admitting: Adult Health

## 2022-12-16 DIAGNOSIS — I10 Essential (primary) hypertension: Secondary | ICD-10-CM

## 2022-12-20 ENCOUNTER — Ambulatory Visit: Payer: Medicare HMO

## 2022-12-27 DIAGNOSIS — F4310 Post-traumatic stress disorder, unspecified: Secondary | ICD-10-CM | POA: Diagnosis not present

## 2022-12-27 DIAGNOSIS — F315 Bipolar disorder, current episode depressed, severe, with psychotic features: Secondary | ICD-10-CM | POA: Diagnosis not present

## 2022-12-27 DIAGNOSIS — F209 Schizophrenia, unspecified: Secondary | ICD-10-CM | POA: Diagnosis not present

## 2022-12-28 ENCOUNTER — Encounter: Payer: Self-pay | Admitting: Podiatry

## 2022-12-28 ENCOUNTER — Ambulatory Visit (INDEPENDENT_AMBULATORY_CARE_PROVIDER_SITE_OTHER): Payer: Medicare HMO | Admitting: Podiatry

## 2022-12-28 DIAGNOSIS — B353 Tinea pedis: Secondary | ICD-10-CM

## 2022-12-28 MED ORDER — TERBINAFINE HCL 250 MG PO TABS: 250.0000 mg | ORAL_TABLET | Freq: Every day | ORAL | 0 refills | Status: DC

## 2022-12-28 NOTE — Progress Notes (Signed)
She presents today chief complaint of dry scaly itching feet between the toes and the medial longitudinal arch bilaterally.  Objective: Pulses are palpable.  Interdigital tinea pedis moccasin type distribution with dry xerotic skin plantar aspect of the foot.  Assessment: Tinea pedis interdigital tinea pedis.  Plan: Started her on 30 days of Lamisil 250 mg tablets.  Will take 1 tablet once daily follow-up with her in 1 month

## 2023-01-01 ENCOUNTER — Ambulatory Visit: Payer: Medicare HMO | Admitting: Internal Medicine

## 2023-01-01 ENCOUNTER — Ambulatory Visit (HOSPITAL_BASED_OUTPATIENT_CLINIC_OR_DEPARTMENT_OTHER): Payer: Medicare HMO | Attending: Primary Care | Admitting: Internal Medicine

## 2023-01-01 DIAGNOSIS — G4733 Obstructive sleep apnea (adult) (pediatric): Secondary | ICD-10-CM | POA: Insufficient documentation

## 2023-01-01 DIAGNOSIS — R0683 Snoring: Secondary | ICD-10-CM | POA: Diagnosis not present

## 2023-01-01 DIAGNOSIS — G4700 Insomnia, unspecified: Secondary | ICD-10-CM | POA: Insufficient documentation
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
## 2023-01-02 ENCOUNTER — Other Ambulatory Visit: Payer: Self-pay | Admitting: Neurology

## 2023-01-02 ENCOUNTER — Ambulatory Visit: Payer: Medicare HMO | Attending: Orthopaedic Surgery

## 2023-01-02 DIAGNOSIS — R569 Unspecified convulsions: Secondary | ICD-10-CM

## 2023-01-02 DIAGNOSIS — M5459 Other low back pain: Secondary | ICD-10-CM | POA: Insufficient documentation

## 2023-01-02 DIAGNOSIS — G43019 Migraine without aura, intractable, without status migrainosus: Secondary | ICD-10-CM

## 2023-01-02 DIAGNOSIS — M6281 Muscle weakness (generalized): Secondary | ICD-10-CM | POA: Insufficient documentation

## 2023-01-03 ENCOUNTER — Telehealth: Payer: Self-pay | Admitting: Adult Health

## 2023-01-03 NOTE — Telephone Encounter (Signed)
Called pt no answer °

## 2023-01-03 NOTE — Telephone Encounter (Signed)
Wants to change blood pressure meds and cholesterol meds (does not have the name) having severe headaches

## 2023-01-04 ENCOUNTER — Telehealth: Payer: Self-pay

## 2023-01-04 NOTE — Telephone Encounter (Signed)
Left message to return phone call.

## 2023-01-04 NOTE — Telephone Encounter (Signed)
Message was routed to appropriate staff to address

## 2023-01-05 NOTE — Telephone Encounter (Signed)
Left message to return phone call. Closing note

## 2023-01-07 DIAGNOSIS — R0683 Snoring: Secondary | ICD-10-CM | POA: Diagnosis not present

## 2023-01-07 NOTE — Procedures (Signed)
    Patient Name: Molly [name], Molly [name] Date: 01/01/2023 Gender: Female D.O.B: 10-11-1976 Age (years): 12 Referring Provider: Geraldo Pitter NP Height (inches): 69 Interpreting Physician: Baird Lyons MD, ABSM Weight (lbs): 178 RPSGT: Gwenyth Allegra BMI: 26 MRN: [id number] Neck Size: 14.00  CLINICAL INFORMATION Sleep Study Type: NPSG Indication for sleep study: Depression, Hypertension Epworth Sleepiness Score: 2  SLEEP STUDY TECHNIQUE As per the AASM Manual for the Scoring of Sleep and Associated Events v2.3 (April 2016) with a hypopnea requiring 4% desaturations.  The channels recorded and monitored were frontal, central and occipital EEG, electrooculogram (EOG), submentalis EMG (chin), nasal and oral airflow, thoracic and abdominal wall motion, anterior tibialis EMG, snore microphone, electrocardiogram, and pulse oximetry.  MEDICATIONS Medications self-administered by patient taken the night of the study : [place] The study was initiated at 10:20:44 PM and ended at 4:26:17 AM.  Sleep onset time was 153.8 minutes and the sleep efficiency was 44.2%. The total sleep time was 161.5 minutes.  Stage REM latency was 162.0 minutes.  The patient spent 2.5% of the night in stage N1 sleep, 78.3% in stage N2 sleep, 0.0% in stage N3 and 19.2% in REM.  Alpha intrusion was absent.  Supine sleep was 29.81%.  RESPIRATORY PARAMETERS The overall apnea/hypopnea index (AHI) was 7.8 per hour. There were 2 total apneas, including 2 obstructive, 0 central and 0 mixed apneas. There were 19 hypopneas and 4 RERAs.  The AHI during Stage REM sleep was 29.0 per hour.  AHI while supine was 7.5 per hour.  The mean oxygen saturation was 95.7%. The minimum SpO2 during sleep was 88.0%.  moderate snoring was noted during this study.  CARDIAC DATA The 2 lead EKG demonstrated sinus rhythm. The mean heart rate was 93.6 beats per minute. Other EKG findings include: None.  LEG  MOVEMENT DATA The total PLMS were 0 with a resulting PLMS index of 0.0. Associated arousal with leg movement index was 0.0 .  IMPRESSIONS - Mild obstructive sleep apnea occurred during this study (AHI = 7.8/h). Most events were in REM. - Oxygen desaturation during the study (Min O2 = 88.0%, Mean 95.7%). - The patient snored with moderate snoring volume. - No cardiac abnormalities were noted during this study. - Clinically significant periodic limb movements did not occur during sleep. No significant associated arousals. - Despite Ambien, patient sleep onset delayed until 01:00 AM.  DIAGNOSIS - Obstructive Sleep Apnea (G47.33)  RECOMMENDATIONS - For mild OSA, conservative options may be considered. Based on clinical judgment, CPAP, a fitted oral appliance or ENT evaluation may be appropriate. - Be careful with alcohol, sedatives and other CNS depressants that may worsen sleep apnea and disrupt normal sleep architecture. - Sleep hygiene should be reviewed to assess factors that may improve sleep quality. - Weight management and regular exercise should be initiated or continued if appropriate.  [Electronically signed] 01/07/2023 01:47 PM  Baird Lyons MD, [place], American Board of Sleep Medicine NPI: [id number]                         [place], [place] of Sleep [facility]  ELECTRONICALLY SIGNED ON:  01/07/2023, 1:42 PM [place] PH: [phone]   FX: [phone] [place]

## 2023-01-09 ENCOUNTER — Encounter: Payer: Self-pay | Admitting: Podiatry

## 2023-01-10 ENCOUNTER — Ambulatory Visit: Payer: Medicare HMO

## 2023-01-10 ENCOUNTER — Telehealth: Payer: Self-pay | Admitting: Primary Care

## 2023-01-10 DIAGNOSIS — G4733 Obstructive sleep apnea (adult) (pediatric): Secondary | ICD-10-CM

## 2023-01-10 DIAGNOSIS — M6281 Muscle weakness (generalized): Secondary | ICD-10-CM

## 2023-01-10 DIAGNOSIS — M5459 Other low back pain: Secondary | ICD-10-CM

## 2023-01-10 NOTE — Telephone Encounter (Signed)
Please let patient know that sleep study 01/01/23 showed evidence of mild obstructive sleep apnea, overall she had average 7.8 apneas/hypopneas an hour.  For mild OSA, conservative options may be considered. If symptomatic CPAP, a fitted oral appliance or ENT evaluation may be appropriate.   She has an appointment with Dr. Melvyn Novas in early March, he does not treat sleep apnea but she can sleep study with him and if she would liek to pursue CPAP we can order

## 2023-01-10 NOTE — Telephone Encounter (Signed)
Auto CPAP 5-15cm h20 with mask of choice. Aim to wear CPAP nightly 4-6 hours or longer. FU for compliance as instructed in 31-90 days

## 2023-01-10 NOTE — Telephone Encounter (Signed)
Called and spoke with pt letting her know the results of the sleep study. Pt said she wants to go ahead and pursue cpap as she has tried a mouth guard in the past which did not help so she does want to try cpap. Routing to [place] for settings.  Pt has been made aware that she will need to have a f/u 31-90 days after starting cpap with Beth.

## 2023-01-10 NOTE — Telephone Encounter (Signed)
Order for cpap start has been placed. Nothing further needed.
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
## 2023-01-11 MED ORDER — KETOCONAZOLE 2 % EX CREA: 1.0000 | TOPICAL_CREAM | Freq: Two times a day (BID) | CUTANEOUS | 2 refills | Status: DC

## 2023-01-17 ENCOUNTER — Ambulatory Visit: Payer: Medicare HMO | Admitting: Internal Medicine

## 2023-01-22 ENCOUNTER — Other Ambulatory Visit: Payer: Self-pay | Admitting: Podiatry

## 2023-01-22 ENCOUNTER — Other Ambulatory Visit: Payer: Self-pay | Admitting: Neurology

## 2023-01-22 DIAGNOSIS — R569 Unspecified convulsions: Secondary | ICD-10-CM

## 2023-01-22 DIAGNOSIS — G43019 Migraine without aura, intractable, without status migrainosus: Secondary | ICD-10-CM

## 2023-01-23 ENCOUNTER — Telehealth: Payer: Self-pay | Admitting: Primary Care

## 2023-01-23 NOTE — Telephone Encounter (Signed)
fluticasone (FLOVENT HFA) 110 MCG/ACT inhaler is not covered by insurance, Pt needs something else sent in replacement.

## 2023-01-24 NOTE — Telephone Encounter (Signed)
Called and spoke to pt, Informed the pt that we will be sending this over to [facility] for Flovent HFA. Pt also has concerns regarding her cpap machine that was ordered 01/10/23. I will contact [facility] about this order for a update.

## 2023-01-24 NOTE — Telephone Encounter (Signed)
I did reach out to [facility] about this cpap order that was made. [facility] did confirm they called this pt 01/18/23 and noted her account that "pt will reach back out when ready due to insurance". I called the pt back to inform her that she would have to reach back out to [facility] for her cpap. Pt verbalized understanding nothing further needed.

## 2023-01-25 ENCOUNTER — Other Ambulatory Visit (HOSPITAL_COMMUNITY): Payer: Self-pay

## 2023-01-25 NOTE — Telephone Encounter (Signed)
Per benefits investigation Arnuity Ellipta is preferred at this time under the patients [employer] Gold plan.

## 2023-01-26 DIAGNOSIS — F209 Schizophrenia, unspecified: Secondary | ICD-10-CM | POA: Diagnosis not present

## 2023-01-26 DIAGNOSIS — F315 Bipolar disorder, current episode depressed, severe, with psychotic features: Secondary | ICD-10-CM | POA: Diagnosis not present

## 2023-01-26 DIAGNOSIS — F4310 Post-traumatic stress disorder, unspecified: Secondary | ICD-10-CM | POA: Diagnosis not present

## 2023-01-27 ENCOUNTER — Other Ambulatory Visit: Payer: Self-pay | Admitting: Adult Health

## 2023-01-27 DIAGNOSIS — G4700 Insomnia, unspecified: Secondary | ICD-10-CM

## 2023-01-31 NOTE — Telephone Encounter (Signed)
Okay for refill?  

## 2023-02-05 DIAGNOSIS — H5213 Myopia, bilateral: Secondary | ICD-10-CM | POA: Diagnosis not present

## 2023-02-21 ENCOUNTER — Telehealth: Payer: Self-pay | Admitting: Primary Care

## 2023-02-22 NOTE — Telephone Encounter (Signed)
Lm x1 for patient.  

## 2023-02-22 NOTE — Telephone Encounter (Signed)
Pt. Calling back to speak with nurse 

## 2023-02-23 DIAGNOSIS — F315 Bipolar disorder, current episode depressed, severe, with psychotic features: Secondary | ICD-10-CM | POA: Diagnosis not present

## 2023-02-23 DIAGNOSIS — F4310 Post-traumatic stress disorder, unspecified: Secondary | ICD-10-CM | POA: Diagnosis not present

## 2023-02-23 DIAGNOSIS — F209 Schizophrenia, unspecified: Secondary | ICD-10-CM | POA: Diagnosis not present

## 2023-02-23 NOTE — Telephone Encounter (Signed)
Called and left patient voicemail for her to call office back to go over issues with cpap

## 2023-03-01 NOTE — Telephone Encounter (Signed)
Since pt has [employer], all orders for cpap or oxygen are sent to [facility] as they are the DME that [facility] insurance is now with.  Called and spoke with pt letting her know this information and she is requesting the last order be sent to the [facility] in [facility] instead of the [place] location.  Routing to [facility] to see if they can send the order from 01/10/23 to [facility] location for pt instead of the [place] location.

## 2023-03-03 DIAGNOSIS — J029 Acute pharyngitis, unspecified: Secondary | ICD-10-CM | POA: Diagnosis not present

## 2023-03-04 ENCOUNTER — Other Ambulatory Visit: Payer: Self-pay | Admitting: Adult Health

## 2023-03-04 DIAGNOSIS — Z760 Encounter for issue of repeat prescription: Secondary | ICD-10-CM

## 2023-03-19 ENCOUNTER — Other Ambulatory Visit: Payer: Self-pay | Admitting: Adult Health

## 2023-03-19 DIAGNOSIS — I10 Essential (primary) hypertension: Secondary | ICD-10-CM

## 2023-03-27 ENCOUNTER — Ambulatory Visit: Payer: Medicare HMO | Admitting: Neurology

## 2023-04-04 ENCOUNTER — Ambulatory Visit (INDEPENDENT_AMBULATORY_CARE_PROVIDER_SITE_OTHER): Payer: Medicare HMO | Admitting: Neurology

## 2023-04-04 ENCOUNTER — Encounter: Payer: Self-pay | Admitting: Neurology

## 2023-04-04 ENCOUNTER — Other Ambulatory Visit: Payer: Self-pay | Admitting: Neurology

## 2023-04-04 VITALS — BP 146/84 | HR 108 | Ht 69.0 in | Wt 179.8 lb

## 2023-04-04 DIAGNOSIS — G43019 Migraine without aura, intractable, without status migrainosus: Secondary | ICD-10-CM

## 2023-04-04 DIAGNOSIS — R569 Unspecified convulsions: Secondary | ICD-10-CM | POA: Diagnosis not present

## 2023-04-04 MED ORDER — EMGALITY 120 MG/ML ~~LOC~~ SOAJ: 1.0000 | SUBCUTANEOUS | 11 refills | Status: DC

## 2023-04-04 MED ORDER — TOPIRAMATE 200 MG PO TABS: ORAL_TABLET | ORAL | 3 refills | Status: DC

## 2023-04-04 MED ORDER — CARBAMAZEPINE 200 MG PO TABS: ORAL_TABLET | ORAL | 3 refills | Status: DC

## 2023-04-04 MED ORDER — ZOLMITRIPTAN 5 MG NA SOLN: 1.0000 | NASAL | 5 refills | Status: DC | PRN
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
## 2023-04-04 NOTE — Progress Notes (Signed)
Medication Samples have been provided to the patient.  Drug name: Emgality       Strength: 240mg         Qty: 1 box  LOT: [id number]  Exp.Date: 12/12/24  Dosing instructions: 1st injection is 2 shots, every month after take injection once a month   The patient has been instructed regarding the correct time, dose, and frequency of taking this medication, including desired effects and most common side effects.

## 2023-04-06 ENCOUNTER — Telehealth: Payer: Self-pay | Admitting: Primary Care

## 2023-04-06 NOTE — Telephone Encounter (Signed)
Please see last encounter. PT calling on status of her CPAP order. She was wanting it sent to a specific [facility] location [facility]) due to unsatisfactory service by [facility] Rep in [place].   It looks like but Jeanice Lim was not understand the questions Irving Burton posed in the encounter. . Seemed like a simple question so there must be a misunderstanding here.  In short: Can this mask order be sent to [facility] in [facility] because the [place] guy at [employer]  was not listening well to her needs about the mask irritating her nose and causing sores (Mask was a silicone suction mask) . OR does all [employer] orders have to  go to [facility] office for pick up?   Please call pt who has been waiting for an answer. Very nice lady and reasonable with her request. She just did not mesh with the [employer] guy and wants to avoid him. Thank you. [phone]

## 2023-04-10 ENCOUNTER — Other Ambulatory Visit: Payer: Self-pay | Admitting: Neurology

## 2023-04-13 ENCOUNTER — Telehealth: Payer: Self-pay | Admitting: Pharmacy Technician

## 2023-04-13 NOTE — Telephone Encounter (Signed)
Spoke with patient regarding prior message. Patient stated she had to give back her CPAP machine to [facility].Patient would like for her to get her machine back and CPAP supplies from [facility] in [facility]. I advised patient I would contact [facility] to get the correct information and I will contact patient back .  Patient's voice was understanding. Will keep encounter open until I'm finish with [facility] .

## 2023-04-13 NOTE — Telephone Encounter (Signed)
Patient Advocate Encounter   Received notification that prior authorization for Emgality 120MG /ML auto-injectors (migraine) is required.   PA submitted on 04/13/2023 Key [id number] Insurance [employer] Electronic PA Form Status is pending       Roland Earl, CPhT Pharmacy Patient Advocate Specialist [facility] Patient Advocate Team Direct Number: [phone]  Fax: [phone]

## 2023-04-13 NOTE — Telephone Encounter (Signed)
Spoke with [employer] regarding patient and CPAP. Patient stated she had to return her CPAP machine due to patient not being compliance with CPAP.[facility] did state patient will need to have a office visit and they already have the script and patient can be re-set up with CPAP.  Advised patient she will need to make a f/u and that has been scheduled with Beth on 05/24/23. Patient would like to use [facility] . Patient's voice was understanding. Nothing else further needed.

## 2023-04-20 MED ORDER — EMGALITY 120 MG/ML ~~LOC~~ SOAJ: 1.0000 | SUBCUTANEOUS | 11 refills | Status: DC

## 2023-04-20 NOTE — Telephone Encounter (Signed)
Patient Advocate Encounter  Prior Authorization for [employer] 120MG /ML auto-injectors (migraine) has been approved through [employer].    (Key: [id number]  Effective: 04-20-2023 to 11-13-2023

## 2023-04-23 ENCOUNTER — Ambulatory Visit (INDEPENDENT_AMBULATORY_CARE_PROVIDER_SITE_OTHER): Payer: Medicare HMO | Admitting: Family Medicine

## 2023-04-23 ENCOUNTER — Ambulatory Visit (INDEPENDENT_AMBULATORY_CARE_PROVIDER_SITE_OTHER): Payer: Medicare HMO

## 2023-04-23 ENCOUNTER — Encounter: Payer: Self-pay | Admitting: Family Medicine

## 2023-04-23 VITALS — Ht 69.0 in | Wt 179.8 lb

## 2023-04-23 DIAGNOSIS — R059 Cough, unspecified: Secondary | ICD-10-CM | POA: Diagnosis not present

## 2023-04-23 DIAGNOSIS — R0602 Shortness of breath: Secondary | ICD-10-CM

## 2023-04-23 DIAGNOSIS — J029 Acute pharyngitis, unspecified: Secondary | ICD-10-CM

## 2023-04-23 DIAGNOSIS — U071 COVID-19: Secondary | ICD-10-CM

## 2023-04-23 LAB — POC COVID19 BINAXNOW: SARS Coronavirus 2 Ag: POSITIVE — AB

## 2023-04-23 LAB — POCT RAPID STREP A (OFFICE): Rapid Strep A Screen: NEGATIVE

## 2023-04-23 MED ORDER — DEXTROMETHORPHAN-GUAIFENESIN 10-100 MG/5ML PO SYRP
5.0000 mL | ORAL_SOLUTION | Freq: Two times a day (BID) | ORAL | 0 refills | Status: DC
Start: 2023-04-23 — End: 2023-05-03

## 2023-04-23 MED ORDER — MOLNUPIRAVIR EUA 200MG CAPSULE
4.0000 | ORAL_CAPSULE | Freq: Two times a day (BID) | ORAL | 0 refills | Status: AC
Start: 2023-04-23 — End: 2023-04-28

## 2023-04-23 MED ORDER — PREDNISONE 10 MG PO TABS
ORAL_TABLET | ORAL | 0 refills | Status: DC
Start: 2023-04-23 — End: 2023-05-03
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
## 2023-04-25 ENCOUNTER — Telehealth: Payer: Self-pay | Admitting: Adult Health

## 2023-04-25 NOTE — Telephone Encounter (Signed)
Prescription Request  04/25/2023  LOV: 10/03/2022  What is the name of the medication or equipment? Guaifensin/codeine. Pt states she was called in a cough medicine, however it is not working for her. She would like guaifensin called in as that has worked for her in the past.   Have you contacted your pharmacy to request a refill? Yes   Which pharmacy would you like this sent to? [id number] - [place], [place] - [address] AT CORNER OF [facility] [address] [place] [address] Phone: [phone] Fax: [phone]   Patient notified that their request is being sent to the clinical staff for review and that they should receive a response within 2 business days.   Please advise at Mobile [phone] (mobile)

## 2023-04-25 NOTE — Telephone Encounter (Signed)
Can you assist with this ? 

## 2023-04-27 NOTE — Telephone Encounter (Signed)
Patient was prescribed a cough medication with guaifenesin in it.  Opioid cough medication is not needed.

## 2023-04-28 ENCOUNTER — Other Ambulatory Visit: Payer: Self-pay | Admitting: Adult Health

## 2023-04-28 ENCOUNTER — Other Ambulatory Visit: Payer: Self-pay | Admitting: Primary Care

## 2023-04-30 NOTE — Telephone Encounter (Signed)
Patient notified of update  and verbalized understanding. 

## 2023-04-30 NOTE — Telephone Encounter (Signed)
Noted  

## 2023-05-01 ENCOUNTER — Other Ambulatory Visit (HOSPITAL_COMMUNITY): Payer: Self-pay

## 2023-05-01 NOTE — Telephone Encounter (Signed)
Arnuity is preferred, has patient previously tried this option?

## 2023-05-03 ENCOUNTER — Other Ambulatory Visit: Payer: Self-pay | Admitting: Podiatry

## 2023-05-03 ENCOUNTER — Encounter: Payer: Self-pay | Admitting: Adult Health

## 2023-05-03 ENCOUNTER — Ambulatory Visit (INDEPENDENT_AMBULATORY_CARE_PROVIDER_SITE_OTHER): Payer: Medicare HMO | Admitting: Adult Health

## 2023-05-03 ENCOUNTER — Telehealth: Payer: Self-pay | Admitting: Primary Care

## 2023-05-03 ENCOUNTER — Other Ambulatory Visit: Payer: Self-pay | Admitting: Neurology

## 2023-05-03 VITALS — BP 100/80 | HR 100 | Temp 98.2°F | Ht 69.0 in | Wt 179.0 lb

## 2023-05-03 DIAGNOSIS — Z6826 Body mass index (BMI) 26.0-26.9, adult: Secondary | ICD-10-CM

## 2023-05-03 DIAGNOSIS — E663 Overweight: Secondary | ICD-10-CM

## 2023-05-03 DIAGNOSIS — G43019 Migraine without aura, intractable, without status migrainosus: Secondary | ICD-10-CM

## 2023-05-03 DIAGNOSIS — R569 Unspecified convulsions: Secondary | ICD-10-CM

## 2023-05-03 NOTE — Telephone Encounter (Signed)
Spoke with patient regarding patient trying Arnuity.Patient stated she has tried Arnuity and it didn't work for her .
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
## 2023-05-03 NOTE — Addendum Note (Signed)
Addended by: Nancy Fetter on: 05/03/2023 08:08 AM   Modules accepted: Orders

## 2023-05-03 NOTE — Telephone Encounter (Signed)
Patient needs help getting order for CPAP machine. States [employer] needs more information. Patient phone number is [phone].

## 2023-05-04 ENCOUNTER — Telehealth: Payer: Self-pay

## 2023-05-04 ENCOUNTER — Other Ambulatory Visit (HOSPITAL_COMMUNITY): Payer: Self-pay

## 2023-05-04 NOTE — Telephone Encounter (Signed)
*  Pulm  PA request received for Fluticasone Propionate HFA 110MCG/ACT aerosol  PA submitted to [facility] via CMM and is pending additional questions/determination  *per office patient failed Arnuity  Key: [employer]

## 2023-05-04 NOTE — Telephone Encounter (Signed)
PA submitted and will be updated in additional encounter created. 

## 2023-05-04 NOTE — Telephone Encounter (Signed)
ATC X1 LVM for patient to call the office back 

## 2023-05-04 NOTE — Telephone Encounter (Signed)
PA has been APPROVED from 05/04/2023-11/13/2023

## 2023-05-08 ENCOUNTER — Other Ambulatory Visit: Payer: Self-pay

## 2023-05-08 ENCOUNTER — Other Ambulatory Visit: Payer: Self-pay | Admitting: Adult Health

## 2023-05-08 DIAGNOSIS — G4700 Insomnia, unspecified: Secondary | ICD-10-CM

## 2023-05-08 MED ORDER — ALBUTEROL SULFATE HFA 108 (90 BASE) MCG/ACT IN AERS: 2.0000 | INHALATION_SPRAY | Freq: Four times a day (QID) | RESPIRATORY_TRACT | 6 refills | Status: DC | PRN

## 2023-05-08 NOTE — Telephone Encounter (Signed)
Spoke with patient regarding prior message.Patient was able to get her CPAP and start it. Moved office visit to 06/24/23 for CPAP compliance.Nothing else further needed.

## 2023-05-08 NOTE — Telephone Encounter (Signed)
Okay for refill?  

## 2023-05-09 ENCOUNTER — Telehealth: Payer: Self-pay | Admitting: Adult Health

## 2023-05-09 NOTE — Telephone Encounter (Signed)
PA approved.

## 2023-05-09 NOTE — Telephone Encounter (Signed)
Pt was just seen by NP on 05/03/23.  Pt says cough has come back and it is worse than ever.   Pt is asking if NP can send something stronger to her pharmacy, or does she need to come back in?  Please advise.

## 2023-05-10 ENCOUNTER — Other Ambulatory Visit: Payer: Self-pay | Admitting: Adult Health

## 2023-05-10 MED ORDER — HYDROCODONE BIT-HOMATROP MBR 5-1.5 MG/5ML PO SOLN: 5.0000 mL | Freq: Three times a day (TID) | ORAL | 0 refills | Status: DC | PRN

## 2023-05-10 NOTE — Telephone Encounter (Signed)
Tried to call pt to notify her of message below but no answer.

## 2023-05-10 NOTE — Telephone Encounter (Signed)
Patient notified of update  and verbalized understanding. 

## 2023-05-10 NOTE — Telephone Encounter (Signed)
Please advise 

## 2023-05-12 ENCOUNTER — Emergency Department (HOSPITAL_BASED_OUTPATIENT_CLINIC_OR_DEPARTMENT_OTHER)
Admission: EM | Admit: 2023-05-12 | Discharge: 2023-05-12 | Disposition: A | Discharge status: Home / Self Care | Payer: Medicare HMO | Attending: Emergency Medicine | Admitting: Emergency Medicine

## 2023-05-12 ENCOUNTER — Encounter (HOSPITAL_BASED_OUTPATIENT_CLINIC_OR_DEPARTMENT_OTHER): Payer: Self-pay | Admitting: Emergency Medicine

## 2023-05-12 DIAGNOSIS — I10 Essential (primary) hypertension: Secondary | ICD-10-CM | POA: Diagnosis not present

## 2023-05-12 DIAGNOSIS — L03011 Cellulitis of right finger: Secondary | ICD-10-CM | POA: Diagnosis not present

## 2023-05-12 DIAGNOSIS — M79641 Pain in right hand: Secondary | ICD-10-CM | POA: Diagnosis present

## 2023-05-12 DIAGNOSIS — Z79899 Other long term (current) drug therapy: Secondary | ICD-10-CM | POA: Insufficient documentation

## 2023-05-12 MED ORDER — CLINDAMYCIN HCL 150 MG PO CAPS: 300.0000 mg | ORAL_CAPSULE | Freq: Three times a day (TID) | ORAL | 0 refills | Status: AC

## 2023-05-12 NOTE — ED Triage Notes (Signed)
Right hand pain. Reports having nails down last week, cut on right thumb. Now handing some swelling, pain and pus from sight
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
## 2023-05-14 DIAGNOSIS — L03011 Cellulitis of right finger: Secondary | ICD-10-CM | POA: Diagnosis not present

## 2023-05-21 ENCOUNTER — Other Ambulatory Visit: Payer: Self-pay

## 2023-05-22 DIAGNOSIS — F315 Bipolar disorder, current episode depressed, severe, with psychotic features: Secondary | ICD-10-CM | POA: Diagnosis not present

## 2023-05-22 DIAGNOSIS — F4310 Post-traumatic stress disorder, unspecified: Secondary | ICD-10-CM | POA: Diagnosis not present

## 2023-05-22 DIAGNOSIS — F209 Schizophrenia, unspecified: Secondary | ICD-10-CM | POA: Diagnosis not present

## 2023-05-24 ENCOUNTER — Ambulatory Visit: Payer: Medicare HMO | Admitting: Primary Care

## 2023-06-17 ENCOUNTER — Other Ambulatory Visit: Payer: Self-pay | Admitting: Adult Health

## 2023-06-17 DIAGNOSIS — I10 Essential (primary) hypertension: Secondary | ICD-10-CM

## 2023-06-18 ENCOUNTER — Ambulatory Visit: Payer: Medicare HMO | Admitting: Primary Care

## 2023-06-18 ENCOUNTER — Encounter: Payer: Self-pay | Admitting: Primary Care

## 2023-06-18 VITALS — BP 126/76 | HR 112 | Temp 98.4°F | Ht 69.0 in | Wt 177.2 lb

## 2023-06-18 DIAGNOSIS — G4733 Obstructive sleep apnea (adult) (pediatric): Secondary | ICD-10-CM | POA: Diagnosis not present

## 2023-06-18 DIAGNOSIS — R058 Other specified cough: Secondary | ICD-10-CM

## 2023-06-18 DIAGNOSIS — G4730 Sleep apnea, unspecified: Secondary | ICD-10-CM

## 2023-06-18 MED ORDER — CHLORPHENIRAMINE MALEATE 4 MG PO TABS: 4.0000 mg | ORAL_TABLET | Freq: Four times a day (QID) | ORAL | 0 refills | Status: DC | PRN

## 2023-06-18 MED ORDER — ARNUITY ELLIPTA 100 MCG/ACT IN AEPB: 1.0000 | INHALATION_SPRAY | Freq: Every day | RESPIRATORY_TRACT | 2 refills | Status: DC
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
## 2023-06-18 NOTE — Assessment & Plan Note (Addendum)
-   Reflux felt to be contributing to cough. Continue Prilosec 40mg  daily and Pepcid

## 2023-06-18 NOTE — Patient Instructions (Addendum)
Recommendations: - Continue to wear CPAP nightly 4 or more hours  - Changed pressure from auto 5-15cm h20 to set pressure 10cm h20  - Stop Flovent due to coverage. Looks like Arnuity may be covered on your plan, I have sent prescription - Continue Prilosec and pepcid as directed  - Continue Fluticasone (Flonase) daily - Use azelastine (Astelin) nasal spray twice a day   Follow-up - 6 months with Beth NP or sooner if needed

## 2023-06-18 NOTE — Assessment & Plan Note (Addendum)
-   Currently well controlled. Falls asleep within 30-45 minutes after taking medication. No residual daytime drowsiness  - Continue Ambien 10mg  at bedtime

## 2023-06-18 NOTE — Assessment & Plan Note (Addendum)
-   PSG February 2024>> Mild OSA, AHI 7.4/hour - Started on CPAP in Spring/Summer 2024, she is 73% compliant CPAP use greater than 4 hours over the last 30 days.  - Current pressure 5 to 15 cm H2O; Residual AHI 2.3 - Will change from auto settings to set pressure of 10 cm H2O to help with comfort/tolerability  - Encourage patient continue her CPAP nightly 4 to 6 hours or longer,, weight loss efforts and focus on side sleeping position.  Advised against driving if experiencing excessive daytime sleepiness fatigue - FU in 6 months or sooner if needed

## 2023-06-18 NOTE — Assessment & Plan Note (Addendum)
-   Cough onset age 47, worse since having covid - [facility] allergy panel 04/22/2020 positive for cedar tree; Eos 0.0, IGE 35 - Flovent not covered, changing to Arnuity daily  - Continue GERD/PND treatment per guidelines. Add Chlorpheniramine 4mg  tablet every 4-6 hours as needed for allergies/cough

## 2023-07-14 ENCOUNTER — Other Ambulatory Visit: Payer: Self-pay | Admitting: Adult Health

## 2023-07-15 ENCOUNTER — Other Ambulatory Visit: Payer: Self-pay | Admitting: Neurology

## 2023-07-15 ENCOUNTER — Other Ambulatory Visit: Payer: Self-pay | Admitting: Podiatry

## 2023-07-15 ENCOUNTER — Other Ambulatory Visit: Payer: Self-pay | Admitting: Family Medicine

## 2023-07-15 DIAGNOSIS — U071 COVID-19: Secondary | ICD-10-CM

## 2023-07-17 ENCOUNTER — Telehealth: Payer: Self-pay | Admitting: Primary Care

## 2023-07-17 DIAGNOSIS — G4733 Obstructive sleep apnea (adult) (pediatric): Secondary | ICD-10-CM

## 2023-07-17 NOTE — Telephone Encounter (Signed)
Needs settings changed on CPAP Machine

## 2023-07-18 NOTE — Telephone Encounter (Signed)
Patient needs her Cpap machine set back in auto mode. Please call and advise [phone]

## 2023-07-19 NOTE — Telephone Encounter (Signed)
Patient is having trouble with the setting of her CPAP.  It is currently set at 10.  Patient would like to go back to auto mode if possible.  New order needed with new setting.

## 2023-07-20 NOTE — Telephone Encounter (Signed)
I placed DME order to change back to auto settings 5-15cm h20

## 2023-08-13 ENCOUNTER — Ambulatory Visit: Payer: Medicare HMO | Admitting: Neurology

## 2023-08-14 ENCOUNTER — Encounter: Payer: Self-pay | Admitting: Neurology

## 2023-08-18 ENCOUNTER — Other Ambulatory Visit: Payer: Self-pay | Admitting: Adult Health

## 2023-08-18 ENCOUNTER — Other Ambulatory Visit: Payer: Self-pay | Admitting: Family Medicine

## 2023-08-18 DIAGNOSIS — R059 Cough, unspecified: Secondary | ICD-10-CM

## 2023-08-18 DIAGNOSIS — R0602 Shortness of breath: Secondary | ICD-10-CM

## 2023-08-18 DIAGNOSIS — G4700 Insomnia, unspecified: Secondary | ICD-10-CM

## 2023-08-20 ENCOUNTER — Telehealth: Payer: Self-pay | Admitting: Adult Health

## 2023-08-20 NOTE — Telephone Encounter (Signed)
Pt is calling and would like to know if she is due any immunizations

## 2023-08-20 NOTE — Telephone Encounter (Signed)
Pt is calling and would like new rx for wt loss tirzepatide or ozempic due to having cpap and she has htn  [id number] - [place], [place] - [address] AT [facility] OF [facility] [address] Phone: [phone]  Fax: [phone]

## 2023-08-20 NOTE — Telephone Encounter (Signed)
Okay for refill?  

## 2023-08-22 NOTE — Telephone Encounter (Signed)
Pt has been scheduled for visit.  

## 2023-08-23 ENCOUNTER — Ambulatory Visit (INDEPENDENT_AMBULATORY_CARE_PROVIDER_SITE_OTHER): Payer: Medicare HMO | Admitting: Adult Health

## 2023-08-23 ENCOUNTER — Encounter: Payer: Self-pay | Admitting: Adult Health

## 2023-08-23 VITALS — BP 120/82 | HR 102 | Temp 98.4°F | Ht 68.0 in | Wt 182.0 lb

## 2023-08-23 DIAGNOSIS — E663 Overweight: Secondary | ICD-10-CM | POA: Diagnosis not present

## 2023-08-23 DIAGNOSIS — M26609 Unspecified temporomandibular joint disorder, unspecified side: Secondary | ICD-10-CM

## 2023-08-23 MED ORDER — METHYLPREDNISOLONE 4 MG PO TBPK
ORAL_TABLET | ORAL | 0 refills | Status: DC
Start: 2023-08-23 — End: 2024-01-10
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
## 2023-09-12 ENCOUNTER — Telehealth: Payer: Self-pay | Admitting: Pharmacy Technician

## 2023-09-12 ENCOUNTER — Other Ambulatory Visit: Payer: Self-pay | Admitting: Adult Health

## 2023-09-12 ENCOUNTER — Other Ambulatory Visit (HOSPITAL_COMMUNITY): Payer: Self-pay

## 2023-09-12 NOTE — Telephone Encounter (Signed)
Pharmacy Patient Advocate Encounter   Received notification from CoverMyMeds that prior authorization for TRULANCE 3MG  is required/requested.   Insurance verification completed.   The patient is insured through [employer] .   Per test claim: PA required; PA submitted to above mentioned insurance via CoverMyMeds Key/confirmation #/EOC [id number] Status is pending

## 2023-09-14 ENCOUNTER — Other Ambulatory Visit: Payer: Self-pay | Admitting: Adult Health

## 2023-09-14 DIAGNOSIS — Z760 Encounter for issue of repeat prescription: Secondary | ICD-10-CM

## 2023-09-20 ENCOUNTER — Other Ambulatory Visit: Payer: Self-pay | Admitting: Adult Health

## 2023-09-20 DIAGNOSIS — I10 Essential (primary) hypertension: Secondary | ICD-10-CM

## 2023-09-21 ENCOUNTER — Other Ambulatory Visit (HOSPITAL_COMMUNITY): Payer: Self-pay

## 2023-09-21 NOTE — Telephone Encounter (Signed)
Pharmacy Patient Advocate Encounter  Received notification from [facility] that Prior Authorization for Trulance 3MG  tablets has been APPROVED from 11/13/2022 to 11/12/2024. Ran test claim, Copay is $0.00. This test claim was processed through [facility]- copay amounts may vary at other pharmacies due to pharmacy/plan contracts, or as the patient moves through the different stages of their insurance plan.   PA #/Case ID/Reference #: [id number]

## 2023-09-24 ENCOUNTER — Other Ambulatory Visit: Payer: Self-pay | Admitting: Primary Care

## 2023-10-04 ENCOUNTER — Ambulatory Visit: Payer: Self-pay | Admitting: Neurology

## 2023-10-05 ENCOUNTER — Ambulatory Visit: Payer: Medicare HMO | Admitting: Internal Medicine
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
## 2023-10-09 ENCOUNTER — Encounter: Payer: Self-pay | Admitting: Internal Medicine

## 2023-10-09 ENCOUNTER — Ambulatory Visit: Payer: Medicare HMO | Admitting: Internal Medicine

## 2023-10-09 VITALS — BP 135/79 | HR 99 | Temp 98.8°F | Ht 69.0 in | Wt 181.4 lb

## 2023-10-09 DIAGNOSIS — R058 Other specified cough: Secondary | ICD-10-CM | POA: Diagnosis not present

## 2023-10-09 MED ORDER — BUDESONIDE-FORMOTEROL FUMARATE 80-4.5 MCG/ACT IN AERO: INHALATION_SPRAY | RESPIRATORY_TRACT | 11 refills | Status: DC

## 2023-10-09 NOTE — Assessment & Plan Note (Signed)
Onset age 47  - Allergy profile 04/22/2020 >  Eos 0.0 /  IgE  35 RAST  pos [place] - 04/22/2020 rx max for gerd/ pnds with 1st gen H1 blockers per guidelines  - 10/09/2023 trial of symbicort 80 prn based on reported response to saba and presence of noct cough   If not better from symbicort can be referred to Dr Kathyrn Lass group in [place] or [facility] as reports best she's was doing was while on allergy shots in [place] which helped eliminate PNDS   - The proper method of use, as well as anticipated side effects, of a metered-dose inhaler were discussed and demonstrated to the patient using teach back method.   Pulmonary f/u can be prn  with all meds in hand using a trust but verify approach to confirm accurate Medication  Reconciliation The principal here is that until we are certain that the  patients are doing what we've asked, it makes no sense to ask them to do more.          Each maintenance medication was reviewed in detail including emphasizing most importantly the difference between maintenance and prns and under what circumstances the prns are to be triggered using an action plan format where appropriate.  Total time for H and P, chart review, counseling, reviewing hfa  device(s) and generating customized AVS unique to this office visit / same day charting = 33 mi summary final f/u ov

## 2023-10-09 NOTE — Patient Instructions (Addendum)
We will see about getting you a replacement mask for your cpap  Try for cough /wheeze or short of breath > symbicort 80 up to 2 puffs as needed   Work on inhaler technique:  relax and gently blow all the way out then take a nice smooth full deep breath back in, triggering the inhaler at same time you start breathing in.  Hold breath in for at least  5 seconds if you can. Blow out symbicort  thru nose. Rinse and gargle with water when done.  If mouth or throat bother you at all,  try brushing teeth/gums/tongue with arm and hammer toothpaste/ make a slurry and gargle and spit out.       If not doing great next step is Allergy eval here or [place]

## 2023-10-29 ENCOUNTER — Other Ambulatory Visit: Payer: Self-pay | Admitting: Podiatry

## 2023-10-29 ENCOUNTER — Other Ambulatory Visit: Payer: Self-pay | Admitting: Physician Assistant

## 2023-12-05 ENCOUNTER — Other Ambulatory Visit: Payer: Self-pay | Admitting: Adult Health

## 2023-12-05 DIAGNOSIS — G4700 Insomnia, unspecified: Secondary | ICD-10-CM

## 2023-12-05 NOTE — Telephone Encounter (Signed)
Okay for refill?  

## 2023-12-07 ENCOUNTER — Telehealth: Payer: Self-pay | Admitting: Physician Assistant

## 2023-12-07 NOTE — Telephone Encounter (Signed)
Patient called and stated that she was having a lot of pain and still on her left side and when she has a bowel movement some of the pain will go away. Patient is wondering what else she can do at home because nothing really seem to work for her. Patient is requesting a call back. Please advise.

## 2023-12-07 NOTE — Telephone Encounter (Signed)
History of constipation. On Linzess 290 mcg. Stools have become difficult to pass. She has used an enema and a suppository with minimal results. She started OCT stool softener once daily. She has manually disimpacted. Stomach hurts. Has some nausea without vomiting. Appointment 01/10/24.  Advised increasing the stool softener to twice a day in addition to her Linzess daily. Keep the appointment. Will put her on cancellation list and try  to get her in to be seen sooner.

## 2023-12-09 ENCOUNTER — Other Ambulatory Visit: Payer: Self-pay | Admitting: Internal Medicine

## 2023-12-10 NOTE — Telephone Encounter (Signed)
Called the patient. No answer. Left message of my call. Molly [name], Amy S, PA-C  You2 days ago    [place], please check on her on Monday-if she has not had any results with bowel movements, I would have her do a MiraLAX purge,, then try to get her worked in to see somebody in the office

## 2023-12-11 NOTE — Telephone Encounter (Signed)
Patient returning call. Please advise. Thank you.

## 2023-12-11 NOTE — Telephone Encounter (Signed)
Patient has purged and has had multiple bowel movements. She reports pain in her left side that is also in her back "under the rib cage." The pain improves with a bowel movement. Afebrile. Has had migraine and nose bleed which she feels is connected to bearing down hard to move her bowels.  Trying to schedule an appointment soon. No openings at this time.

## 2023-12-12 NOTE — Telephone Encounter (Signed)
Spoke with the patient. Appointment 12/13/23 at 11:30 offered and accepted.

## 2023-12-13 ENCOUNTER — Encounter: Payer: Self-pay | Admitting: Gastroenterology

## 2023-12-13 ENCOUNTER — Other Ambulatory Visit: Payer: Self-pay | Admitting: Physician Assistant

## 2023-12-13 ENCOUNTER — Ambulatory Visit (INDEPENDENT_AMBULATORY_CARE_PROVIDER_SITE_OTHER): Payer: 59 | Admitting: Gastroenterology

## 2023-12-13 VITALS — BP 124/80 | Ht 69.0 in | Wt 180.2 lb

## 2023-12-13 DIAGNOSIS — K602 Anal fissure, unspecified: Secondary | ICD-10-CM

## 2023-12-13 DIAGNOSIS — K219 Gastro-esophageal reflux disease without esophagitis: Secondary | ICD-10-CM | POA: Diagnosis not present

## 2023-12-13 DIAGNOSIS — K5909 Other constipation: Secondary | ICD-10-CM | POA: Diagnosis not present

## 2023-12-13 DIAGNOSIS — K644 Residual hemorrhoidal skin tags: Secondary | ICD-10-CM

## 2023-12-13 DIAGNOSIS — R1032 Left lower quadrant pain: Secondary | ICD-10-CM | POA: Diagnosis not present

## 2023-12-13 DIAGNOSIS — K6289 Other specified diseases of anus and rectum: Secondary | ICD-10-CM

## 2023-12-13 MED ORDER — AMBULATORY NON FORMULARY MEDICATION: 0 refills | Status: AC

## 2023-12-13 MED ORDER — MOTEGRITY 2 MG PO TABS: 2.0000 mg | ORAL_TABLET | Freq: Every day | ORAL | 0 refills | Status: AC
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
## 2023-12-13 NOTE — Patient Instructions (Addendum)
We have sent a prescription for Diltiazem 2% gel to [facility] for you. Using your index finger, you should apply a pea size amount of medication inside the rectum up to your first knuckle/joint twice daily 4-6 weeks.  [facility]'s information is below: Address: [address], [place], [place] [address]  [phone]  *Please DO NOT go directly from our office to pick up this medication! Give the pharmacy 1 day to process the prescription as this is compounded and takes time to make.  We have sent the following medications to your pharmacy for you to pick up at your convenience:  Motegrity   Follow-up in 8 weeks with Boone Master on 01/21/24 @ 10:40am  _Please purchase the following medications over the counter and take as directed:  IBgard  If your blood pressure at your visit was 140/90 or greater, please contact your primary care physician to follow up on this.  _______________________________________________________  If you are age 26 or older, your body mass index should be between 23-30. Your Body mass index is 26.62 kg/m. If this is out of the aforementioned range listed, please consider follow up with your Primary Care Provider.  If you are age 35 or younger, your body mass index should be between 19-25. Your Body mass index is 26.62 kg/m. If this is out of the aformentioned range listed, please consider follow up with your Primary Care Provider.   ________________________________________________________  The [facility] providers would like to encourage you to use [facility] to communicate with providers for non-urgent requests or questions.  Due to long hold times on the telephone, sending your provider a message by [facility] may be a faster and more efficient way to get a response.  Please allow 48 business hours for a response.  Please remember that this is for non-urgent requests.  _______________________________________________________  Thank you  for trusting me with your gastrointestinal care!   Boone Master, PA

## 2023-12-14 NOTE — Progress Notes (Signed)
Attending Physician's Attestation   I have reviewed the chart.   I agree with the Advanced Practitioner's note, impression, and recommendations with any updates as below.    Emersyn Wyss Mansouraty, MD [facility]oscopy Office # [phone]  

## 2023-12-17 ENCOUNTER — Emergency Department (HOSPITAL_BASED_OUTPATIENT_CLINIC_OR_DEPARTMENT_OTHER)
Admission: EM | Admit: 2023-12-17 | Discharge: 2023-12-17 | Disposition: A | Discharge status: Home / Self Care | Payer: 59

## 2023-12-17 ENCOUNTER — Emergency Department (HOSPITAL_BASED_OUTPATIENT_CLINIC_OR_DEPARTMENT_OTHER): Payer: 59

## 2023-12-17 ENCOUNTER — Other Ambulatory Visit: Payer: Self-pay | Admitting: Adult Health

## 2023-12-17 ENCOUNTER — Other Ambulatory Visit: Payer: Self-pay

## 2023-12-17 ENCOUNTER — Other Ambulatory Visit (HOSPITAL_BASED_OUTPATIENT_CLINIC_OR_DEPARTMENT_OTHER): Payer: Self-pay

## 2023-12-17 ENCOUNTER — Encounter (HOSPITAL_BASED_OUTPATIENT_CLINIC_OR_DEPARTMENT_OTHER): Payer: Self-pay | Admitting: Emergency Medicine

## 2023-12-17 DIAGNOSIS — K5900 Constipation, unspecified: Secondary | ICD-10-CM | POA: Diagnosis not present

## 2023-12-17 DIAGNOSIS — D649 Anemia, unspecified: Secondary | ICD-10-CM | POA: Insufficient documentation

## 2023-12-17 DIAGNOSIS — K5909 Other constipation: Secondary | ICD-10-CM | POA: Diagnosis not present

## 2023-12-17 DIAGNOSIS — R1084 Generalized abdominal pain: Secondary | ICD-10-CM | POA: Diagnosis present

## 2023-12-17 DIAGNOSIS — I10 Essential (primary) hypertension: Secondary | ICD-10-CM

## 2023-12-17 DIAGNOSIS — R109 Unspecified abdominal pain: Secondary | ICD-10-CM | POA: Diagnosis not present

## 2023-12-17 DIAGNOSIS — Z79899 Other long term (current) drug therapy: Secondary | ICD-10-CM | POA: Diagnosis not present

## 2023-12-17 LAB — CBC WITH DIFFERENTIAL/PLATELET
Abs Immature Granulocytes: 0.03 10*3/uL (ref 0.00–0.07)
Basophils Absolute: 0 10*3/uL (ref 0.0–0.1)
Basophils Relative: 0 %
Eosinophils Absolute: 0.1 10*3/uL (ref 0.0–0.5)
Eosinophils Relative: 1 %
HCT: 35.4 % — ABNORMAL LOW (ref 36.0–46.0)
Hemoglobin: 11.7 g/dL — ABNORMAL LOW (ref 12.0–15.0)
Immature Granulocytes: 1 %
Lymphocytes Relative: 29 %
Lymphs Abs: 1.9 10*3/uL (ref 0.7–4.0)
MCH: 30.2 pg (ref 26.0–34.0)
MCHC: 33.1 g/dL (ref 30.0–36.0)
MCV: 91.5 fL (ref 80.0–100.0)
Monocytes Absolute: 0.4 10*3/uL (ref 0.1–1.0)
Monocytes Relative: 6 %
Neutro Abs: 4.1 10*3/uL (ref 1.7–7.7)
Neutrophils Relative %: 63 %
Platelets: 239 10*3/uL (ref 150–400)
RBC: 3.87 MIL/uL (ref 3.87–5.11)
RDW: 12.7 % (ref 11.5–15.5)
WBC: 6.4 10*3/uL (ref 4.0–10.5)
nRBC: 0 % (ref 0.0–0.2)

## 2023-12-17 LAB — URINALYSIS, ROUTINE W REFLEX MICROSCOPIC
Bilirubin Urine: NEGATIVE
Glucose, UA: NEGATIVE mg/dL
Hgb urine dipstick: NEGATIVE
Ketones, ur: NEGATIVE mg/dL
Leukocytes,Ua: NEGATIVE
Nitrite: NEGATIVE
Protein, ur: NEGATIVE mg/dL
Specific Gravity, Urine: 1.017 (ref 1.005–1.030)
pH: 6.5 (ref 5.0–8.0)

## 2023-12-17 LAB — COMPREHENSIVE METABOLIC PANEL
ALT: 25 U/L (ref 0–44)
AST: 20 U/L (ref 15–41)
Albumin: 4.2 g/dL (ref 3.5–5.0)
Alkaline Phosphatase: 89 U/L (ref 38–126)
Anion gap: 9 (ref 5–15)
BUN: 12 mg/dL (ref 6–20)
CO2: 29 mmol/L (ref 22–32)
Calcium: 9.5 mg/dL (ref 8.9–10.3)
Chloride: 101 mmol/L (ref 98–111)
Creatinine, Ser: 0.96 mg/dL (ref 0.44–1.00)
GFR, Estimated: >60 mL/min (ref >60)
Glucose, Bld: 83 mg/dL (ref 70–99)
Potassium: 3.5 mmol/L (ref 3.5–5.1)
Sodium: 139 mmol/L (ref 135–145)
Total Bilirubin: 0.5 mg/dL (ref 0.0–1.2)
Total Protein: 7.7 g/dL (ref 6.5–8.1)

## 2023-12-17 LAB — LIPASE, BLOOD: Lipase: 24 U/L (ref 11–51)

## 2023-12-17 MED ORDER — SODIUM CHLORIDE 0.9 % IV BOLUS
1000.0000 mL | Freq: Once | INTRAVENOUS | Status: AC
  Administered 2023-12-17: 1000 mL via INTRAVENOUS

## 2023-12-17 MED ORDER — IOHEXOL 300 MG/ML  SOLN
80.0000 mL | Freq: Once | INTRAMUSCULAR | Status: AC | PRN
  Administered 2023-12-17: 100 mL via INTRAVENOUS

## 2023-12-17 MED ORDER — ONDANSETRON 4 MG PO TBDP: 4.0000 mg | ORAL_TABLET | Freq: Three times a day (TID) | ORAL | 0 refills | Status: DC | PRN

## 2023-12-17 NOTE — Discharge Instructions (Addendum)
You have been seen today for your complaint of abdominal pain. Your lab work was reassuring. Your imaging was reassuring. Your discharge medications include a refill for Zofran. Home care instructions are as follows:  Double your dose of MiraLAX to twice daily until you have symptom relief Follow up with: Your GI provider Please seek immediate medical care if you develop any of the following symptoms: You cannot stop vomiting. Your pain is only in one part of the abdomen. Pain on the right side could be caused by appendicitis. You have bloody or black poop (stool), or poop that looks like tar. You have trouble breathing. You have chest pain. At this time there does not appear to be the presence of an emergent medical condition, however there is always the potential for conditions to change. Please read and follow the below instructions.  Do not take your medicine if  develop an itchy rash, swelling in your mouth or lips, or difficulty breathing; call 911 and seek immediate emergency medical attention if this occurs.  You may review your lab tests and imaging results in their entirety on your MyChart account.  Please discuss all results of fully with your primary care provider and other specialist at your follow-up visit.  Note: Portions of this text may have been transcribed using voice recognition software. Every effort was made to ensure accuracy; however, inadvertent computerized transcription errors may still be present.

## 2023-12-17 NOTE — ED Triage Notes (Signed)
Pt arrived caox4, ambulatory, NAD c/o L flank pain radiating to the back that is now on the R side ongoing "for months." Pt reports she was seen at GI recently for chronic constipation but it hasn't gotten any better with the meds. LBM yest.

## 2023-12-17 NOTE — Telephone Encounter (Signed)
Left message for patient to call back.  [place]- Was patient supposed to be sent a script for motegrity or given samples? I do not see any indication of script being sent so wanted to get that going if needed.Marland KitchenMarland Kitchen

## 2023-12-17 NOTE — ED Provider Notes (Signed)
[place] EMERGENCY DEPARTMENT AT [facility] Provider Note   CSN: [id number] Arrival date & time: 12/17/23  3244     History  Chief Complaint  Patient presents with   Flank Pain    Molly [name] is a 48 y.o. female.  With history of GERD, fibromyalgia, bipolar disorder, hypertension, anxiety, depression, chronic constipation presenting to the ED for evaluation of bilateral flank pain.  States she has left flank pain chronically which she suspects is due to her constipation.  Over the last couple of days the pain has radiated to the right flank and she has had decreased urinary output.  She reports taking numerous medications for her constipation and seeing GI for this.  She had a bowel movement yesterday which was small and hard.  She reports some nausea but no vomiting.  No fevers or chills.  No frequency or urgency.   Flank Pain       Home Medications Prior to Admission medications   Medication Sig Start Date End Date Taking? Authorizing Provider  ondansetron (ZOFRAN-ODT) 4 MG disintegrating tablet Take 1 tablet (4 mg total) by mouth every 8 (eight) hours as needed for nausea or vomiting. 12/17/23  Yes Joshua Zeringue, Edsel Petrin, PA-C  albuterol (VENTOLIN HFA) 108 (90 Base) MCG/ACT inhaler TAKE 2 PUFFS BY MOUTH EVERY 6 HOURS AS NEEDED FOR WHEEZE OR SHORTNESS OF BREATH 12/11/23   Nyoka Cowden, MD  amantadine (SYMMETREL) 100 MG capsule Take 100 mg by mouth daily. Patient not taking: Reported on 12/13/2023 12/09/21   [provider]  AMBULATORY NON FORMULARY MEDICATION Diltiazem 2% gel/ Lidocaine 5% Using your index finger, you should apply a pea size amount of medication inside the rectum up to your first knuckle/joint twice daily x 6 weeks. 12/13/23   McMichael, Bayley M, PA-C  amLODipine (NORVASC) 5 MG tablet TAKE 1 TABLET (5 MG TOTAL) BY MOUTH DAILY. 12/17/23   Nafziger, Kandee Keen, NP  ammonium lactate (LAC-HYDRIN) 12 % lotion APPLY TO AFFECTED AREA(S) AS NEEDED FOR DRY SKIN  01/05/20   Hyatt, Max T, DPM  atorvastatin (LIPITOR) 40 MG tablet TAKE 1 TABLET BY MOUTH EVERY DAY 09/12/23   Nafziger, Kandee Keen, NP  Azelaic Acid 15 % cream  12/31/19   [provider]  azelastine (OPTIVAR) 0.05 % ophthalmic solution INSTILL 1 DROP INTO BOTH EYES TWICE A DAY 11/15/21   Nafziger, Kandee Keen, NP  Azelastine HCl 137 MCG/SPRAY SOLN SPRAY 1 SPRAY INTO EACH NOSTRIL TWICE A DAY Patient not taking: Reported on 12/13/2023 07/17/23   Shirline Frees, NP  benztropine (COGENTIN) 1 MG tablet Take 1 mg by mouth daily. Patient not taking: Reported on 12/13/2023 08/25/20   [provider]  budesonide-formoterol (SYMBICORT) 80-4.5 MCG/ACT inhaler Take 2 puffs first thing in am and then another 2 puffs about 12 hours later. Patient not taking: Reported on 12/13/2023 10/09/23   Nyoka Cowden, MD  CAPLYTA 42 MG CAPS  12/29/19   [provider]  carbamazepine (TEGRETOL) 200 MG tablet Take 1 tablet every night for 1 week, then increase to 1 tablet twice a day and continue Patient not taking: Reported on 12/13/2023 04/04/23   Van Clines, MD  cetirizine (ZYRTEC) 10 MG tablet Take 1 tablet (10 mg total) by mouth daily. **DUE FOR YEARLY PHYSICAL** 04/22/20   Nafziger, Kandee Keen, NP  chlorpheniramine (CHLOR-TRIMETON) 4 MG tablet Take 1 tablet (4 mg total) by mouth every 6 (six) hours as needed for allergies (Cough). Patient not taking: Reported on 12/13/2023 06/18/23  Glenford Bayley, NP  cyclobenzaprine (FLEXERIL) 10 MG tablet TAKE 1 TABLET BY MOUTH EVERYDAY AT BEDTIME 09/14/23   Nafziger, Kandee Keen, NP  diclofenac Sodium (VOLTAREN) 1 % GEL APPLY 2 GRAMS TO AFFECTED AREA FOUR TIMES DAILY 10/28/19   Tyrell Antonio, MD  EPINEPHrine (EPIPEN 2-PAK) 0.3 mg/0.3 mL IJ SOAJ injection Inject 0.3 mg into the muscle as needed for anaphylaxis. 10/12/22   Nafziger, Kandee Keen, NP  famotidine (PEPCID) 20 MG tablet Take 1 tablet (20 mg total) by mouth at bedtime. 07/18/22   Nyoka Cowden, MD  fluticasone (FLONASE) 50  MCG/ACT nasal spray SPRAY 2 SPRAYS INTO EACH NOSTRIL EVERY DAY 04/30/23   Nafziger, Kandee Keen, NP  Galcanezumab-gnlm (EMGALITY) 120 MG/ML SOAJ Inject 1 Pen into the skin every 30 (thirty) days. 05/04/23   Van Clines, MD  haloperidol (HALDOL) 5 MG tablet Take 5 mg by mouth 2 (two) times daily. Patient not taking: Reported on 12/13/2023 08/25/20   [provider]  HYDROcodone bit-homatropine (HYCODAN) 5-1.5 MG/5ML syrup Take 5 mLs by mouth every 8 (eight) hours as needed for cough. Patient not taking: Reported on 12/13/2023 05/10/23   Shirline Frees, NP  ibuprofen (ADVIL) 800 MG tablet TAKE 1 TABLET BY MOUTH EVERY 8 HOURS AS NEEDED 09/13/22   Nafziger, Kandee Keen, NP  ketoconazole (NIZORAL) 2 % cream APPLY TO AFFECTED AREA TWICE A DAY 10/29/23   Standiford, Jenelle Mages, DPM  lidocaine (XYLOCAINE) 2 % solution  03/03/23   [provider]  linaclotide Karlene Einstein) 290 MCG CAPS capsule Take 1 capsule (290 mcg total) by mouth daily before breakfast. 11/17/22   Esterwood, Amy S, PA-C  methylPREDNISolone (MEDROL DOSEPAK) 4 MG TBPK tablet Take as directed Patient not taking: Reported on 12/13/2023 08/23/23   Shirline Frees, NP  Multiple Vitamin (MULTIVITAMIN) capsule Take 1 capsule by mouth daily. Patient not taking: Reported on 12/13/2023    [provider]  OLANZapine (ZYPREXA) 10 MG tablet Take 10 mg by mouth at bedtime. Patient not taking: Reported on 12/13/2023 10/20/20   [provider]  omeprazole (PRILOSEC) 40 MG capsule Take 1 capsule (40 mg total) by mouth daily before supper. Patient not taking: Reported on 12/13/2023 11/17/22   Esterwood, Amy S, PA-C  Plecanatide (TRULANCE) 3 MG TABS Take 1 tablet by mouth daily. Patient not taking: Reported on 12/13/2023 10/03/22   Imogene Burn, MD  polyethylene glycol powder (GLYCOLAX/MIRALAX) 17 GM/SCOOP powder Take 17 g by mouth daily. Patient not taking: Reported on 12/13/2023 10/24/22   Fayrene Helper, PA-C  Prucalopride Succinate (MOTEGRITY) 2  MG TABS Take 1 tablet (2 mg total) by mouth daily. 12/13/23 01/12/24  McMichael, Saddie Benders, PA-C  QUEtiapine (SEROQUEL) 400 MG tablet TAKE 1 TABLET (400 MG TOTAL) BY MOUTH AT BEDTIME. Patient not taking: Reported on 12/13/2023 03/23/21   Shirline Frees, NP  sodium phosphate (FLEET) 7-19 GM/118ML ENEM Place 133 mLs (1 enema total) rectally daily as needed for severe constipation. Patient not taking: Reported on 12/13/2023 10/24/22   Fayrene Helper, PA-C  topiramate (TOPAMAX) 200 MG tablet Take 1 tablet twice a day Patient not taking: Reported on 12/13/2023 04/04/23   Van Clines, MD  TRINTELLIX 10 MG TABS tablet Take 10 mg by mouth daily. Patient not taking: Reported on 12/13/2023 06/01/20   [provider]  VRAYLAR 1.5 MG capsule Take 1.5 mg by mouth daily. Patient not taking: Reported on 12/13/2023 03/17/23   [provider]  zinc gluconate 50 MG tablet Take 50 mg by mouth daily. Patient  not taking: Reported on 12/13/2023    [provider]  zolmitriptan (ZOMIG) 5 MG nasal solution Place 1 spray into the nose as needed for migraine. 04/04/23   Van Clines, MD  zolpidem (AMBIEN) 10 MG tablet TAKE 1 TABLET BY MOUTH EVERY DAY AT BEDTIME AS NEEDED 12/05/23   Shirline Frees, NP      Allergies    Fish allergy, Benzyl alcohol, Heparin, Coumadin [warfarin sodium], Crestor [rosuvastatin], Doxycycline, Methocarbamol, Phenergan [promethazine hcl], Toradol [ketorolac tromethamine], and Nystatin    Review of Systems   Review of Systems  Genitourinary:  Positive for flank pain.  All other systems reviewed and are negative.   Physical Exam Updated Vital Signs BP (!) 144/99 (BP Location: Right Arm)   Pulse 96   Temp 98.2 F (36.8 C)   Resp 18   Ht 5\' 9"  (1.753 m)   Wt 83.9 kg   SpO2 98%   BMI 27.32 kg/m  Physical Exam Vitals and nursing note reviewed.  Constitutional:      General: She is not in acute distress.    Appearance: Normal appearance. She is normal weight. She is  not ill-appearing.  HENT:     Head: Normocephalic and atraumatic.  Pulmonary:     Effort: Pulmonary effort is normal. No respiratory distress.  Abdominal:     General: Abdomen is flat.     Palpations: Abdomen is soft.     Tenderness: There is abdominal tenderness (Bilateral, outer abdomen). There is no guarding.  Musculoskeletal:        General: Normal range of motion.     Cervical back: Neck supple.  Skin:    General: Skin is warm and dry.  Neurological:     Mental Status: She is alert and oriented to person, place, and time.  Psychiatric:        Mood and Affect: Mood normal.        Behavior: Behavior normal.     ED Results / Procedures / Treatments   Labs (all labs ordered are listed, but only abnormal results are displayed) Labs Reviewed  CBC WITH DIFFERENTIAL/PLATELET - Abnormal; Notable for the following components:      Result Value   Hemoglobin 11.7 (*)    HCT 35.4 (*)    All other components within normal limits  URINALYSIS, ROUTINE W REFLEX MICROSCOPIC  COMPREHENSIVE METABOLIC PANEL  LIPASE, BLOOD    EKG None  Radiology CT ABDOMEN PELVIS W CONTRAST Result Date: 12/17/2023 CLINICAL DATA:  Chronic bilateral flank pain. EXAM: CT ABDOMEN AND PELVIS WITH CONTRAST TECHNIQUE: Multidetector CT imaging of the abdomen and pelvis was performed using the standard protocol following bolus administration of intravenous contrast. RADIATION DOSE REDUCTION: This exam was performed according to the departmental dose-optimization program which includes automated exposure control, adjustment of the mA and/or kV according to patient size and/or use of iterative reconstruction technique. CONTRAST:  OMNIPAQUE IOHEXOL 300 MG/ML  SOLN COMPARISON:  CT abdomen pelvis dated October 24, 2022. FINDINGS: Lower chest: No acute abnormality. Hepatobiliary: Unchanged fat along the falciform ligament. No other focal liver abnormality. Normal gallbladder. No biliary dilatation. Pancreas:  Unremarkable. No pancreatic ductal dilatation or surrounding inflammatory changes. Spleen: Normal in size without focal abnormality. Adrenals/Urinary Tract: Adrenal glands are unremarkable. Kidneys are normal, without renal calculi, focal lesion, or hydronephrosis. Bladder is unremarkable. Stomach/Bowel: Stomach is within normal limits. Appendix appears normal. No evidence of bowel wall thickening, distention, or inflammatory changes. Vascular/Lymphatic: No significant vascular findings are present. No enlarged  abdominal or pelvic lymph nodes. Reproductive: Status post hysterectomy. No adnexal masses. Other: No free fluid or pneumoperitoneum. Musculoskeletal: No acute or significant osseous findings. IMPRESSION: 1. No acute intra-abdominal process. Electronically Signed   By: Obie Dredge M.D.   On: 12/17/2023 11:18    Procedures Procedures    Medications Ordered in ED Medications  sodium chloride 0.9 % bolus 1,000 mL (0 mLs Intravenous Stopped 12/17/23 1137)  iohexol (OMNIPAQUE) 300 MG/ML solution 80 mL (100 mLs Intravenous Contrast Given 12/17/23 1035)    ED Course/ Medical Decision Making/ A&P                                 Medical Decision Making Amount and/or Complexity of Data Reviewed Labs: ordered. Radiology: ordered.  Risk Prescription drug management.  This patient presents to the ED for concern of abdominal pain, this involves an extensive number of treatment options, and is a complaint that carries with it a high risk of complications and morbidity. The differential diagnosis for generalized abdominal pain includes, but is not limited to AAA, gastroenteritis, appendicitis, Bowel obstruction, Bowel perforation. Gastroparesis, DKA, Hernia, Inflammatory bowel disease, mesenteric ischemia, pancreatitis, peritonitis SBP, volvulus.   My initial workup includes labs, imaging.  Patient declines pain or nausea medication  Additional history obtained from: Nursing notes from this  visit.  I ordered, reviewed and interpreted labs which include: CBC, CMP, lipase, urinalysis.  Urine without evidence of infection.  No hematuria.  No leukocytosis.  Mild anemia.  No electrolyte derangement, kidney dysfunction, LFT abnormality.  Normal lipase.  I ordered imaging studies including CT abdomen pelvis I independently visualized and interpreted imaging which showed negative I agree with the radiologist interpretation  Afebrile, hemodynamically stable.  48 year old female presenting to the ED for evaluation of bilateral flank pain.  States the pain is now radiated to the right but is typically on the left.  Struggles with chronic constipation and is on an extensive regimen for GI for this.  She reports compliance with her medications.  Last had a bowel movement yesterday which was small and hard.  There is mild tenderness to palpation over abdomen.  No rigidity or guarding.  Lab workup reassuring.  CT abdomen pelvis without acute abnormalities.  She was encouraged to increase her MiraLAX to twice daily and drink plenty of water.  Refill of her Zofran sent.  She was encouraged to follow-up with her primary care provider and GI provider.  She was given return precautions.  Stable at discharge.  At this time there does not appear to be any evidence of an acute emergency medical condition and the patient appears stable for discharge with appropriate outpatient follow up. Diagnosis was discussed with patient who verbalizes understanding of care plan and is agreeable to discharge. I have discussed return precautions with patient who verbalizes understanding. Patient encouraged to follow-up with their PCP within 1 week. All questions answered.  Note: Portions of this report may have been transcribed using voice recognition software. Every effort was made to ensure accuracy; however, inadvertent computerized transcription errors may still be present.         Final Clinical Impression(s) / ED  Diagnoses Final diagnoses:  Generalized abdominal pain  Chronic constipation    Rx / DC Orders ED Discharge Orders          Ordered    ondansetron (ZOFRAN-ODT) 4 MG disintegrating tablet  Every 8 hours PRN  12/17/23 1225              Michelle Piper, PA-C 12/17/23 1228    Coral Spikes, DO 12/17/23 1616

## 2023-12-17 NOTE — Telephone Encounter (Signed)
Patient called stating she was just seen at the ED and is requesting to speak with a nurse to double on Miralax and is stating her pain is now on both sides.

## 2023-12-18 NOTE — Addendum Note (Signed)
Addended by: Richardson Chiquito on: 12/18/2023 09:01 AM   Modules accepted: Orders

## 2023-12-18 NOTE — Telephone Encounter (Signed)
 Spoke to patient. She actually DID already receive motegrity  2 mg from pharmacy (I overlooked this on her medlist previously).  Patient states that she went to the ER yesterday for increasing abdominal discomfort. States that she has been taking Motegrity  2 mg for about 3 days now. Also says she is taking Linzess  290, Miralax  daily and nothing is helping. Patient tells me that she was able to have 1 very small bowel movement yesterday and noticed some bleeding with that. States that she continues to have left sided abdominal pain from the breast down and around to the flank area. Also has RUQ abdominal pain. +nausea but no vomiting. No fever. Is able to pass gas/burp.  We discussed constipation and the importance of eating foods high in fiber and increasing water intake to at least 64 ounces per day. Patient admits she has not been drinking a lot of water and also states she has not really been eating due to the abdominal fullness/discomfort.  We have discussed the possibility of using fleets enema to clear the lower colon.   Would it be helpful to give her a bowel purge with colonoscopy prep?  I also reminded patient that should she begin having severe abdominal pain, fever, nausea and intractable vomiting, inability to pass gas, that she should again report to the emergency room. She verbalizes understanding.

## 2023-12-19 MED ORDER — HYOSCYAMINE SULFATE 0.125 MG SL SUBL: 0.1250 mg | SUBLINGUAL_TABLET | SUBLINGUAL | 1 refills | Status: DC | PRN

## 2023-12-19 NOTE — Telephone Encounter (Signed)
 Discussed recommendations of Bayley to give Motegrity  a bit more time to work. Also advised that it may be beneficial for us  to give her a bowel purge/prep. Patient tells me I already did that last Friday and it didn't help. No charted indication that our office provided any prep or purge instructions previously.  I advised that we can send hyoscyamine  to help with abdominal discomfort for now. Advised she may let us  know if she does not see any improvement within the next several days. Patient does state that she had a small, hard bowel movement yesterday.  Rx sent to pharmacy for hyoscyamine .

## 2023-12-19 NOTE — Addendum Note (Signed)
 Addended by: Glennette Lanius on: 12/19/2023 09:00 AM   Modules accepted: Orders

## 2023-12-21 ENCOUNTER — Telehealth: Payer: Self-pay

## 2023-12-21 ENCOUNTER — Telehealth: Payer: Self-pay | Admitting: Primary Care

## 2023-12-21 ENCOUNTER — Telehealth: Payer: Self-pay | Admitting: Gastroenterology

## 2023-12-21 DIAGNOSIS — J4521 Mild intermittent asthma with (acute) exacerbation: Secondary | ICD-10-CM | POA: Diagnosis not present

## 2023-12-21 DIAGNOSIS — R059 Cough, unspecified: Secondary | ICD-10-CM | POA: Diagnosis not present

## 2023-12-21 DIAGNOSIS — R112 Nausea with vomiting, unspecified: Secondary | ICD-10-CM | POA: Diagnosis not present

## 2023-12-21 MED ORDER — ALBUTEROL SULFATE (2.5 MG/3ML) 0.083% IN NEBU: 2.5000 mg | INHALATION_SOLUTION | Freq: Four times a day (QID) | RESPIRATORY_TRACT | 2 refills | Status: AC | PRN

## 2023-12-21 MED ORDER — COMPRESSOR/NEBULIZER MISC: 0 refills | Status: AC

## 2023-12-21 NOTE — Telephone Encounter (Signed)
 Called and spoke to patient.  She stated that she tested positive for flu today at [facility] today. She was given a breathing treatment at [facility] and prescribed prednisone . C/o increased SOB, prod cough with green sputum, fever, chills and sweats. She is requesting order for nebulizer and neb solution.   Dr. Darlean, please advise.

## 2023-12-21 NOTE — Telephone Encounter (Signed)
 Spoke with patient  If levsin  is not covered by her insurance, she may use a goodrx card to bring the cost to about 16 dollars. Dicyclomine is not an appropriate option for her as she is constipated and this can make constipation worse. Patient verbalized understanding of this.  Patient states she continues to have abdominal pain. Says she did take another magnesium  citrate and got some relief but feels that it was not extremely effective.   Patient says she has the flu and has been vomiting multiple times per day over the last couple days. Says she is unable to keep anything down and is unable to stay hydrated as previously instructed by our office. She does say that she was given zofran  this morning from urgent care and will try to use this prior to attempting to drink fluids.  We discussed ER precautions (worsening weakness, dizziness, SOB, intractable vomiting) and she verbalizes understanding.  I also suggested patient let us  know once she is feeling improved from her acute viral illness so that we can possibly send a prescription bowel purge to her pharmacy to clear her colon. She also verbalizes understanding of this.

## 2023-12-21 NOTE — Telephone Encounter (Signed)
 Patient called requesting to speak with a nurse stating she has the flu and was advised to stay hydrated but is not able to keep anything down. Patient also states Levsin medication is not covered by her insurance.

## 2023-12-21 NOTE — Telephone Encounter (Signed)
 Patient would like to get a prescription for a nebulizer machine and the medicine to go with it. Patient has the flu and it is hard for her to breath. Please call and advise [phone]

## 2023-12-21 NOTE — Telephone Encounter (Signed)
 Copied from CRM [id number]. Topic: Clinical - Medication Question >> Dec 21, 2023 10:48 AM [name] wrote: Reason for CRM: Patient states her insurance switched to [facility], the cough medicine she was prescribed is not covered under insurance & not affordable (BROMPHENIRAMINE; PSEUDOEPHEDRINE)  patient is requesting a substitute medication.

## 2023-12-21 NOTE — Telephone Encounter (Signed)
 Fine with me;  Albuterol  2.5 mg q 4 h prn

## 2023-12-21 NOTE — Telephone Encounter (Signed)
 Pt is aware of below message and voiced her understanding.  Neb machine and albuterol  sent to [facility].  Nothing further needed.

## 2023-12-21 NOTE — Telephone Encounter (Signed)
 Please advise on substitute or if pt needs an ov

## 2023-12-22 ENCOUNTER — Other Ambulatory Visit: Payer: Self-pay | Admitting: Physician Assistant

## 2023-12-25 NOTE — Telephone Encounter (Signed)
Patient notified of update  and verbalized understanding.

## 2023-12-25 NOTE — Telephone Encounter (Signed)
Pt was able to find some relief.

## 2023-12-28 ENCOUNTER — Telehealth: Payer: Self-pay

## 2023-12-28 ENCOUNTER — Ambulatory Visit: Payer: Self-pay | Admitting: Adult Health

## 2023-12-28 NOTE — Telephone Encounter (Signed)
Pt stated that she fatigue and needs iron supplement sent to pharmacy. Pt just had labs done at ED and wondering if those are enough to get a refill from PCP.

## 2023-12-28 NOTE — Telephone Encounter (Signed)
Copied from CRM [id number]. Topic: Clinical - Pink Word Triage >> Dec 28, 2023  1:47 PM Molly [name] wrote: Reason for Triage: Pink Word: Weakness.   Patient called to advise that she is feeling weak. She used to take iron tablets and suspects that her weakness is due to anemia.   Patient advised that she's had the flu for a few weeks. She's now beginning to eat and drink regularly but not as well as she should be.   Please call patient to review symptoms.

## 2023-12-28 NOTE — Telephone Encounter (Addendum)
Copied From CRM [id number]. Reason for Triage: Pink Word: Weakness.   Patient called to advise that she is feeling weak. She used to take iron tablets and suspects that her weakness is due to anemia.   Patient advised that she's had the flu for a few weeks. She's now beginning to eat and drink regularly but not as well as she should be.   Please call patient to review symptoms.  Attempted 3 calls without answer.  PCP office updated.

## 2024-01-01 ENCOUNTER — Other Ambulatory Visit: Payer: Self-pay | Admitting: Internal Medicine

## 2024-01-01 ENCOUNTER — Telehealth: Payer: Self-pay | Admitting: Gastroenterology

## 2024-01-01 NOTE — Telephone Encounter (Signed)
 Patient notified of update  and verbalized understanding.

## 2024-01-01 NOTE — Telephone Encounter (Signed)
 Inbound call from patient, would like to discuss omeprazole and Linzess medication with a nurse. State prescription has not been called in yet, is unsure of what the status is

## 2024-01-02 ENCOUNTER — Other Ambulatory Visit: Payer: Self-pay

## 2024-01-02 ENCOUNTER — Other Ambulatory Visit: Payer: Self-pay | Admitting: Gastroenterology

## 2024-01-02 MED ORDER — OMEPRAZOLE 40 MG PO CPDR: 40.0000 mg | DELAYED_RELEASE_CAPSULE | Freq: Every day | ORAL | 3 refills | Status: DC

## 2024-01-02 NOTE — Telephone Encounter (Signed)
 Inbound call from patient requesting a call regarding previous note. States she needs omeprazole medication due to acid coming up when she is without it. Please advise, thank you.

## 2024-01-06 ENCOUNTER — Other Ambulatory Visit: Payer: Self-pay | Admitting: Podiatry

## 2024-01-06 ENCOUNTER — Other Ambulatory Visit: Payer: Self-pay | Admitting: Physician Assistant

## 2024-01-06 ENCOUNTER — Other Ambulatory Visit: Payer: Self-pay | Admitting: Neurology

## 2024-01-07 DIAGNOSIS — G4733 Obstructive sleep apnea (adult) (pediatric): Secondary | ICD-10-CM | POA: Diagnosis not present

## 2024-01-10 ENCOUNTER — Encounter: Payer: Self-pay | Admitting: Gastroenterology

## 2024-01-10 ENCOUNTER — Ambulatory Visit (INDEPENDENT_AMBULATORY_CARE_PROVIDER_SITE_OTHER): Payer: 59 | Admitting: Gastroenterology

## 2024-01-10 VITALS — BP 102/70 | HR 106 | Ht 69.0 in | Wt 176.0 lb

## 2024-01-10 DIAGNOSIS — R1012 Left upper quadrant pain: Secondary | ICD-10-CM | POA: Insufficient documentation

## 2024-01-10 DIAGNOSIS — K649 Unspecified hemorrhoids: Secondary | ICD-10-CM | POA: Diagnosis not present

## 2024-01-10 DIAGNOSIS — K219 Gastro-esophageal reflux disease without esophagitis: Secondary | ICD-10-CM | POA: Diagnosis not present

## 2024-01-10 DIAGNOSIS — K5909 Other constipation: Secondary | ICD-10-CM | POA: Insufficient documentation

## 2024-01-10 DIAGNOSIS — R110 Nausea: Secondary | ICD-10-CM | POA: Insufficient documentation

## 2024-01-10 MED ORDER — FAMOTIDINE 20 MG PO TABS: 20.0000 mg | ORAL_TABLET | Freq: Every day | ORAL | 3 refills | Status: AC

## 2024-01-10 MED ORDER — SUFLAVE 178.7 G PO SOLR: 1.0000 | Freq: Once | ORAL | 0 refills | Status: AC
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
## 2024-01-10 NOTE — Telephone Encounter (Signed)
 Encounter was taking care of. Please see my chart message.

## 2024-01-10 NOTE — Patient Instructions (Addendum)
 We have sent the following medications to your pharmacy for you to pick up at your convenience: Famotidine  You have been scheduled for an endoscopy and colonoscopy. Please follow the written instructions given to you at your visit today.  If you use inhalers (even only as needed), please bring them with you on the day of your procedure.  DO NOT TAKE 7 DAYS PRIOR TO TEST- Trulicity (dulaglutide) Ozempic, Wegovy (semaglutide) Mounjaro (tirzepatide) Bydureon Bcise (exanatide extended release)  DO NOT TAKE 1 DAY PRIOR TO YOUR TEST Rybelsus (semaglutide) Adlyxin (lixisenatide) Victoza (liraglutide) Byetta (exanatide) __________________________________________________________________  Bonita Quin will receive your bowel preparation through [facility], which ensures the lowest copay and home delivery, with outreach via text or call from an [phone] number. Please respond promptly to avoid rescheduling of your procedure. If you are interested in alternative options or have any questions regarding your prep, please contact them at [phone] ____________________________________________________________________________  Your Provider Has Sent Your Bowel Prep Regimen To [facility]   [facility] will contact you to verify your information and collect your copay, if applicable. Enjoy the comfort of your home while your prescription is mailed to you, FREE of any shipping charges.   [facility] accepts all major insurance benefits and applies discounts & coupons.  Have additional questions?   Chat: [email or web address] Call: [phone] Email: [email or web address] NCPDP: [id number]  How will [facility] contact you?  With a Welcome phone call,  a Welcome text and a checkout link in text form.  Texts you receive from [phone] Are NOT Spam.  *To set up delivery, you must complete the checkout process via link or speak to one of the patient care representatives. If [facility] is unable to  reach you, your prescription may be delayed.  To avoid long hold times on the phone, you may also utilize the secure chat feature on the [facility] website to request that they call you back for transaction completion or to expedite your concerns.  _______________________________________________________  If your blood pressure at your visit was 140/90 or greater, please contact your primary care physician to follow up on this.  _______________________________________________________  If you are age 80 or older, your body mass index should be between 23-30. Your Body mass index is 25.99 kg/m. If this is out of the aforementioned range listed, please consider follow up with your Primary Care Provider.  If you are age 69 or younger, your body mass index should be between 19-25. Your Body mass index is 25.99 kg/m. If this is out of the aformentioned range listed, please consider follow up with your Primary Care Provider.   ________________________________________________________  The [facility] providers would like to encourage you to use [facility] to communicate with providers for non-urgent requests or questions.  Due to long hold times on the telephone, sending your provider a message by [facility] may be a faster and more efficient way to get a response.  Please allow 48 business hours for a response.  Please remember that this is for non-urgent requests.  _______________________________________________________

## 2024-01-10 NOTE — Progress Notes (Signed)
 Attending Physician's Attestation   I have reviewed the chart.   I agree with the Advanced Practitioner's note, impression, and recommendations with any updates as below.    Corliss Parish, MD [facility] Advanced Endoscopy Office # [phone]

## 2024-01-11 ENCOUNTER — Telehealth: Payer: Self-pay

## 2024-01-11 NOTE — Telephone Encounter (Signed)
 Pt needs a PA for Emgality 120 mg

## 2024-01-14 ENCOUNTER — Telehealth: Payer: Self-pay

## 2024-01-14 ENCOUNTER — Other Ambulatory Visit (HOSPITAL_COMMUNITY): Payer: Self-pay

## 2024-01-14 NOTE — Telephone Encounter (Signed)
*  [facility]  Pharmacy Patient Advocate Encounter   Received notification from Pt Calls Messages that prior authorization for Emgality 120MG /ML auto-injectors (migraine)  is required/requested.   Insurance verification completed.   The patient is insured through [facility] .   Per test claim: PA required; PA submitted to above mentioned insurance via CoverMyMeds Key/confirmation #/EOC [id number] Status is pending

## 2024-01-14 NOTE — Telephone Encounter (Signed)
 PA request has been Started. New Encounter has been or will be created for follow up. For additional info see Pharmacy Prior Auth telephone encounter from 03/03.

## 2024-01-16 ENCOUNTER — Telehealth: Payer: Self-pay | Admitting: Gastroenterology

## 2024-01-16 MED ORDER — PRUCALOPRIDE SUCCINATE 2 MG PO TABS: 2.0000 mg | ORAL_TABLET | Freq: Every day | ORAL | 5 refills | Status: DC

## 2024-01-16 NOTE — Telephone Encounter (Signed)
 Patient informed Motegrity  sent to pharmacy. Patient informed to reach out to [facility] and they should be able to help with the cost of the Suflave or be able to switch to a more affordable option. Patient voiced understanding.

## 2024-01-16 NOTE — Telephone Encounter (Signed)
 Pharmacy Patient Advocate Encounter  Received notification from [facility] that Prior Authorization for Emgality 120MG /ML auto-injectors (migraine)  has been APPROVED from 01/14/2024 to 11/12/2024

## 2024-01-16 NOTE — Telephone Encounter (Signed)
 Inbound call from patient needing montegrity refill as well as insurance not covering prep medication. Please advise.   Thank you

## 2024-01-18 ENCOUNTER — Other Ambulatory Visit (HOSPITAL_COMMUNITY): Payer: Self-pay

## 2024-01-21 ENCOUNTER — Ambulatory Visit: Payer: 59 | Admitting: Gastroenterology

## 2024-02-04 ENCOUNTER — Encounter: Payer: Self-pay | Admitting: Gastroenterology

## 2024-02-04 DIAGNOSIS — G4733 Obstructive sleep apnea (adult) (pediatric): Secondary | ICD-10-CM | POA: Diagnosis not present

## 2024-02-12 ENCOUNTER — Ambulatory Visit: Payer: 59 | Admitting: Gastroenterology

## 2024-02-12 ENCOUNTER — Encounter: Payer: Self-pay | Admitting: Gastroenterology

## 2024-02-12 VITALS — BP 115/69 | HR 90 | Temp 97.6°F | Resp 17 | Ht 69.0 in | Wt 176.0 lb

## 2024-02-12 DIAGNOSIS — Z1211 Encounter for screening for malignant neoplasm of colon: Secondary | ICD-10-CM | POA: Diagnosis not present

## 2024-02-12 DIAGNOSIS — K641 Second degree hemorrhoids: Secondary | ICD-10-CM

## 2024-02-12 DIAGNOSIS — R1012 Left upper quadrant pain: Secondary | ICD-10-CM

## 2024-02-12 DIAGNOSIS — K319 Disease of stomach and duodenum, unspecified: Secondary | ICD-10-CM | POA: Diagnosis not present

## 2024-02-12 DIAGNOSIS — R110 Nausea: Secondary | ICD-10-CM

## 2024-02-12 DIAGNOSIS — D120 Benign neoplasm of cecum: Secondary | ICD-10-CM

## 2024-02-12 DIAGNOSIS — K635 Polyp of colon: Secondary | ICD-10-CM

## 2024-02-12 DIAGNOSIS — M797 Fibromyalgia: Secondary | ICD-10-CM | POA: Diagnosis not present

## 2024-02-12 DIAGNOSIS — D122 Benign neoplasm of ascending colon: Secondary | ICD-10-CM

## 2024-02-12 DIAGNOSIS — K3189 Other diseases of stomach and duodenum: Secondary | ICD-10-CM

## 2024-02-12 MED ORDER — SODIUM CHLORIDE 0.9 % IV SOLN: 500.0000 mL | Freq: Once | INTRAVENOUS | Status: DC

## 2024-02-12 NOTE — Patient Instructions (Signed)

## 2024-02-12 NOTE — Progress Notes (Unsigned)
 Called to room to assist during endoscopic procedure.  Patient ID and intended procedure confirmed with present staff. Received instructions for my participation in the procedure from the performing physician.

## 2024-02-12 NOTE — Progress Notes (Unsigned)
 A/o x 3, VSS, gd SR's, pleased with anesthesia, report to RN

## 2024-02-12 NOTE — Op Note (Signed)
 [facility] Patient Name: Molly [name] Procedure Date: 02/12/2024 2:04 PM MRN: [id number] Endoscopist: Corliss Parish , MD, [id number] Age: 48 Referring MD:  Date of Birth: 1976-03-25 Gender: Female Account #: [id number] Procedure:                Colonoscopy Indications:              Screening for colorectal malignant neoplasm Medicines:                Monitored Anesthesia Care Procedure:                Pre-Anesthesia Assessment:                           - Prior to the procedure, a History and Physical                            was performed, and patient medications and                            allergies were reviewed. The patient's tolerance of                            previous anesthesia was also reviewed. The risks                            and benefits of the procedure and the sedation                            options and risks were discussed with the patient.                            All questions were answered, and informed consent                            was obtained. Prior Anticoagulants: The patient has                            taken no anticoagulant or antiplatelet agents                            except for NSAID medication. ASA Grade Assessment:                            III - A patient with severe systemic disease. After                            reviewing the risks and benefits, the patient was                            deemed in satisfactory condition to undergo the                            procedure.  After obtaining informed consent, the colonoscope                            was passed under direct vision. Throughout the                            procedure, the patient's blood pressure, pulse, and                            oxygen saturations were monitored continuously. The                            Olympus Scope [id number] was introduced through the                            anus and advanced to  the the cecum, identified by                            appendiceal orifice and ileocecal valve. The                            colonoscopy was performed without difficulty. The                            patient tolerated the procedure. The quality of the                            bowel preparation was adequate. The ileocecal                            valve, appendiceal orifice, and rectum were                            photographed. Scope In: 2:32:32 PM Scope Out: 2:47:37 PM Scope Withdrawal Time: 0 hours 11 minutes 59 seconds  Total Procedure Duration: 0 hours 15 minutes 5 seconds  Findings:                 The digital rectal exam findings include                            hemorrhoids. Pertinent negatives include no                            palpable rectal lesions.                           Two sessile polyps were found in the ascending                            colon and cecum. The polyps were 2 to 4 mm in size.                            These polyps were removed with a cold snare.  Resection and retrieval were complete.                           Normal mucosa was found in the entire colon                            otherwise.                           Non-bleeding non-thrombosed external and internal                            hemorrhoids were found during retroflexion, during                            perianal exam and during digital exam. The                            hemorrhoids were Grade II (internal hemorrhoids                            that prolapse but reduce spontaneously). Complications:            No immediate complications. Estimated Blood Loss:     Estimated blood loss was minimal. Impression:               - Hemorrhoids found on digital rectal exam.                           - Two 2 to 4 mm polyps in the ascending colon and                            in the cecum, removed with a cold snare. Resected                            and  retrieved.                           - Normal mucosa in the entire examined colon                            otherwise.                           - Non-bleeding non-thrombosed external and internal                            hemorrhoids. Recommendation:           - The patient will be observed post-procedure,                            until all discharge criteria are met.                           - Discharge patient to home.                           -  Patient has a contact number available for                            emergencies. The signs and symptoms of potential                            delayed complications were discussed with the                            patient. Return to normal activities tomorrow.                            Written discharge instructions were provided to the                            patient.                           - High fiber diet.                           - Use FiberCon 1-2 tablets PO daily.                           - Continue present medications.                           - Await pathology results.                           - Repeat colonoscopy in 5-10 years for surveillance                            based on pathology results.                           - The findings and recommendations were discussed                            with the patient.                           - The findings and recommendations were discussed                            with the patient's family. Corliss Parish, MD 02/12/2024 2:56:50 PM
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
## 2024-02-12 NOTE — Op Note (Signed)
 [facility] Patient Name: Molly [name] Procedure Date: 02/12/2024 2:10 PM MRN: [id number] Endoscopist: Corliss Parish , MD, [id number] Age: 48 Referring MD:  Date of Birth: 09-01-1976 Gender: Female Account #: [id number] Procedure:                Upper GI endoscopy Indications:              Abdominal pain in the left upper quadrant Medicines:                Monitored Anesthesia Care Procedure:                Pre-Anesthesia Assessment:                           - Prior to the procedure, a History and Physical                            was performed, and patient medications and                            allergies were reviewed. The patient's tolerance of                            previous anesthesia was also reviewed. The risks                            and benefits of the procedure and the sedation                            options and risks were discussed with the patient.                            All questions were answered, and informed consent                            was obtained. Prior Anticoagulants: The patient has                            taken no anticoagulant or antiplatelet agents                            except for NSAID medication. ASA Grade Assessment:                            III - A patient with severe systemic disease. After                            reviewing the risks and benefits, the patient was                            deemed in satisfactory condition to undergo the                            procedure.  After obtaining informed consent, the endoscope was                            passed under direct vision. Throughout the                            procedure, the patient's blood pressure, pulse, and                            oxygen saturations were monitored continuously. The                            Olympus Scope [id number] was introduced through the                            mouth, and advanced to  the second part of duodenum.                            The upper GI endoscopy was accomplished without                            difficulty. The patient tolerated the procedure. Scope In: Scope Out: Findings:                 No gross lesions were noted in the entire esophagus.                           The Z-line was regular and was found 38 cm from the                            incisors.                           Patchy mildly erythematous mucosa without bleeding                            was found in the entire examined stomach. Biopsies                            were taken with a cold forceps for histology and                            Helicobacter pylori testing.                           No gross lesions were noted in the duodenal bulb,                            in the first portion of the duodenum and in the                            second portion of the duodenum. Biopsies were taken  with a cold forceps for histology. Complications:            No immediate complications. Estimated Blood Loss:     Estimated blood loss was minimal. Impression:               - No gross lesions in the entire esophagus. Z-line                            regular, 38 cm from the incisors.                           - Erythematous mucosa in the stomach. Biopsied.                           - No gross lesions in the duodenal bulb, in the                            first portion of the duodenum and in the second                            portion of the duodenum. Biopsied. Recommendation:           - Proceed to scheduled colonoscopy.                           - Continue present medications.                           - Await pathology results.                           - The findings and recommendations were discussed                            with the patient.                           - The findings and recommendations were discussed                            with the  patient's family. Corliss Parish, MD 02/12/2024 2:52:19 PM

## 2024-02-12 NOTE — Progress Notes (Unsigned)
 Pt's states no medical or surgical changes since previsit or office visit.

## 2024-02-13 ENCOUNTER — Telehealth: Payer: Self-pay

## 2024-02-13 NOTE — Telephone Encounter (Signed)
  Follow up Call-     02/12/2024    1:45 PM  Call back number  Post procedure Call Back phone  # [phone]  Permission to leave phone message Yes     Patient questions:  Do you have a fever, pain , or abdominal swelling? Yes.   Pain Score  4 *  Have you tolerated food without any problems? No.  Have you been able to return to your normal activities? Yes.    Do you have any questions about your discharge instructions: Diet   No. Medications  No. Follow up visit  No.  Do you have questions or concerns about your Care? No.  Actions: * If pain score is 4 or above: No action needed, pain <4.  Patient stated still having some gas and discomfort, leading to difficulty with eating and drinking. RN advised patient to try warm fluids, walking around, and Gas-X if needed, following instructions on package.  RN also advised patient to treat sore throat with whatever patient usually finds helpful for sore throats. Patient stated understanding.  RN asked patient to call tomorrow if she has not seen any improvement in symptoms.

## 2024-02-15 ENCOUNTER — Telehealth: Payer: Self-pay | Admitting: Gastroenterology

## 2024-02-15 ENCOUNTER — Ambulatory Visit: Payer: Self-pay

## 2024-02-15 NOTE — Telephone Encounter (Signed)
 PT is calling to ask that a new prescription for nexium be sent in that says it needs to be taken twice daily. Please send to [facility].

## 2024-02-15 NOTE — Telephone Encounter (Signed)
 Inbound call from patient stating she is still experiencing a significant amount of pain in her LUQ abdomen and leading around to her back. States it is painful when eating and drinking. States she tried to take gas x but has not helped. Requesting a call back to discuss further. Please advise, thank you.

## 2024-02-15 NOTE — Telephone Encounter (Signed)
 Copied from CRM [id number]. Topic: Clinical - Red Word Triage >> Feb 15, 2024 10:13 AM [place] E wrote: [facility] that prompted transfer to Nurse Triage: Patient is having pain under her rib cage area, that is going around to her back. Pain has been going on for the past 2 days and patient rated it at a level 10 out of 10. Patient stated when it hurts, she can barely eat or drink.  Chief Complaint: abd pain post colonoscopy.   Symptoms: pain Frequency: comes and goes, but pain under ribs is constant Pertinent Negatives: Patient denies fever, blood in stool, n/v Disposition: [x] ED /[] Urgent Care (no appt availability in office) / [] Appointment(In office/virtual)/ []  [facility]/ [] Home Care/ [] Refused Recommended Disposition /[] [facility]/ []  Follow-up with PCP Additional Notes: had colonoscopy on Monday and is now experiencing pain. Instructed to go to the ER.  Patient states she will call gastro to report symptoms and determine course of action with gastro office.  Care advice given, denies questions; instructed to go to ER if becomes worse.   Reason for Disposition  [1] SEVERE pain (e.g., excruciating) AND [2] present > 1 hour  Answer Assessment - Initial Assessment Questions 1. LOCATION: "Where does it hurt?"      abd 2. RADIATION: "Does the pain shoot anywhere else?" (e.g., chest, back)     Goes to back 3. ONSET: "When did the pain begin?" (e.g., minutes, hours or days ago)      Two days ago 4. SUDDEN: "Gradual or sudden onset?"     gradual 5. PATTERN "Does the pain come and go, or is it constant?"    - If it comes and goes: "How long does it last?" "Do you have pain now?"     (Note: Comes and goes means the pain is intermittent. It goes away completely between bouts.)    - If constant: "Is it getting better, staying the same, or getting worse?"      (Note: Constant means the pain never goes away completely; most serious pain is constant and gets worse.)       Comes and goes, but under ribs stays constant 6. SEVERITY: "How bad is the pain?"  (e.g., Scale 1-10; mild, moderate, or severe)    - MILD (1-3): Doesn't interfere with normal activities, abdomen soft and not tender to touch.     - MODERATE (4-7): Interferes with normal activities or awakens from sleep, abdomen tender to touch.     - SEVERE (8-10): Excruciating pain, doubled over, unable to do any normal activities.       10/10 7. RECURRENT SYMPTOM: "Have you ever had this type of stomach pain before?" If Yes, ask: "When was the last time?" and "What happened that time?"      Yes 8. CAUSE: "What do you think is causing the stomach pain?"     unknown 9. RELIEVING/AGGRAVATING FACTORS: "What makes it better or worse?" (e.g., antacids, bending or twisting motion, bowel movement)     Not eating 10. OTHER SYMPTOMS: "Do you have any other symptoms?" (e.g., back pain, diarrhea, fever, urination pain, vomiting)       Back pain,  11. PREGNANCY: "Is there any chance you are pregnant?" "When was your last menstrual period?"       na  Protocols used: Abdominal Pain - [facility]

## 2024-02-15 NOTE — Telephone Encounter (Signed)
 I spoke with the pt and she tells me that her symptoms have not gotten any better and she wants her pathology results from recent endo colon. I did let her know that we have not gotten the results as of today.  She tells  me that her last BM was 2 days ago was small. She is going to try a dose of miralax today and see if that helps. She is taking motegrity as well as pepcid and omeprazole BID.  She is aware we will reach out to her as soon as the results have returned.

## 2024-02-16 ENCOUNTER — Other Ambulatory Visit: Payer: Self-pay

## 2024-02-16 ENCOUNTER — Encounter (HOSPITAL_BASED_OUTPATIENT_CLINIC_OR_DEPARTMENT_OTHER): Payer: Self-pay | Admitting: Emergency Medicine

## 2024-02-16 ENCOUNTER — Emergency Department (HOSPITAL_BASED_OUTPATIENT_CLINIC_OR_DEPARTMENT_OTHER)
Admission: EM | Admit: 2024-02-16 | Discharge: 2024-02-16 | Disposition: A | Discharge status: Home / Self Care | Attending: Emergency Medicine | Admitting: Emergency Medicine

## 2024-02-16 ENCOUNTER — Telehealth: Payer: Self-pay | Admitting: Pediatrics

## 2024-02-16 ENCOUNTER — Emergency Department (HOSPITAL_BASED_OUTPATIENT_CLINIC_OR_DEPARTMENT_OTHER)

## 2024-02-16 DIAGNOSIS — D649 Anemia, unspecified: Secondary | ICD-10-CM | POA: Diagnosis not present

## 2024-02-16 DIAGNOSIS — R1012 Left upper quadrant pain: Secondary | ICD-10-CM | POA: Diagnosis not present

## 2024-02-16 DIAGNOSIS — R109 Unspecified abdominal pain: Secondary | ICD-10-CM | POA: Diagnosis not present

## 2024-02-16 DIAGNOSIS — Z79899 Other long term (current) drug therapy: Secondary | ICD-10-CM | POA: Diagnosis not present

## 2024-02-16 DIAGNOSIS — K625 Hemorrhage of anus and rectum: Secondary | ICD-10-CM | POA: Diagnosis not present

## 2024-02-16 LAB — CBC WITH DIFFERENTIAL/PLATELET
Abs Immature Granulocytes: 0.01 10*3/uL (ref 0.00–0.07)
Basophils Absolute: 0 10*3/uL (ref 0.0–0.1)
Basophils Relative: 0 %
Eosinophils Absolute: 0.1 10*3/uL (ref 0.0–0.5)
Eosinophils Relative: 2 %
HCT: 32.1 % — ABNORMAL LOW (ref 36.0–46.0)
Hemoglobin: 10.8 g/dL — ABNORMAL LOW (ref 12.0–15.0)
Immature Granulocytes: 0 %
Lymphocytes Relative: 39 %
Lymphs Abs: 2.6 10*3/uL (ref 0.7–4.0)
MCH: 30.6 pg (ref 26.0–34.0)
MCHC: 33.6 g/dL (ref 30.0–36.0)
MCV: 90.9 fL (ref 80.0–100.0)
Monocytes Absolute: 0.6 10*3/uL (ref 0.1–1.0)
Monocytes Relative: 8 %
Neutro Abs: 3.4 10*3/uL (ref 1.7–7.7)
Neutrophils Relative %: 51 %
Platelets: 222 10*3/uL (ref 150–400)
RBC: 3.53 MIL/uL — ABNORMAL LOW (ref 3.87–5.11)
RDW: 12.9 % (ref 11.5–15.5)
WBC: 6.6 10*3/uL (ref 4.0–10.5)
nRBC: 0 % (ref 0.0–0.2)

## 2024-02-16 LAB — COMPREHENSIVE METABOLIC PANEL WITH GFR
ALT: 19 U/L (ref 0–44)
AST: 21 U/L (ref 15–41)
Albumin: 3.7 g/dL (ref 3.5–5.0)
Alkaline Phosphatase: 75 U/L (ref 38–126)
Anion gap: 9 (ref 5–15)
BUN: 11 mg/dL (ref 6–20)
CO2: 28 mmol/L (ref 22–32)
Calcium: 9.3 mg/dL (ref 8.9–10.3)
Chloride: 102 mmol/L (ref 98–111)
Creatinine, Ser: 0.96 mg/dL (ref 0.44–1.00)
GFR, Estimated: >60 mL/min (ref >60)
Glucose, Bld: 78 mg/dL (ref 70–99)
Potassium: 3.6 mmol/L (ref 3.5–5.1)
Sodium: 139 mmol/L (ref 135–145)
Total Bilirubin: 0.4 mg/dL (ref 0.0–1.2)
Total Protein: 7.3 g/dL (ref 6.5–8.1)

## 2024-02-16 LAB — LIPASE, BLOOD: Lipase: 33 U/L (ref 11–51)

## 2024-02-16 MED ORDER — ONDANSETRON HCL 4 MG/2ML IJ SOLN
4.0000 mg | Freq: Once | INTRAMUSCULAR | Status: AC
  Administered 2024-02-16: 4 mg via INTRAVENOUS
  Filled 2024-02-16: qty 2

## 2024-02-16 MED ORDER — SODIUM CHLORIDE 0.9 % IV BOLUS
500.0000 mL | Freq: Once | INTRAVENOUS | Status: AC
  Administered 2024-02-16: 500 mL via INTRAVENOUS

## 2024-02-16 MED ORDER — IOHEXOL 300 MG/ML  SOLN
100.0000 mL | Freq: Once | INTRAMUSCULAR | Status: DC | PRN
Start: 2024-02-16 — End: 2024-02-16

## 2024-02-16 MED ORDER — FENTANYL CITRATE PF 50 MCG/ML IJ SOSY
50.0000 ug | PREFILLED_SYRINGE | Freq: Once | INTRAMUSCULAR | Status: AC
  Administered 2024-02-16: 50 ug via INTRAVENOUS
  Filled 2024-02-16: qty 1

## 2024-02-16 MED ORDER — MORPHINE SULFATE (PF) 4 MG/ML IV SOLN
4.0000 mg | Freq: Once | INTRAVENOUS | Status: AC
  Administered 2024-02-16: 4 mg via INTRAVENOUS
  Filled 2024-02-16: qty 1

## 2024-02-16 NOTE — ED Provider Notes (Signed)
 [place] [facility] Provider Note   CSN: [id number] Arrival date & time: 02/16/24  0159     History  Chief Complaint  Patient presents with   Rectal Bleeding    Molly [name] is a 48 y.o. female.  The history is provided by the patient.  Rectal Bleeding Molly [name] is a 48 y.o. female who presents to the Emergency Department complaining of abdominal pain.  She presents to the emergency department for evaluation of severe left upper quadrant abdominal pain that radiates to her back.  Pain is worse with eating and drinking and started on Wednesday.  She had an upper endoscopy and colonoscopy performed on Monday and did have polyps removed.  She has been taking Gas-X and has been taking multiple medications for constipation.  She used a suppository earlier than day and was able to have a thin bloody BM just prior to ED arrival.  She describes it as patchy but with mucus.  No rectal pain.  No fever.  She has nausea but no vomiting.  No dysuria.   Seizure d/o takes topamax    Home Medications Prior to Admission medications   Medication Sig Start Date End Date Taking? Authorizing Provider  albuterol (PROVENTIL) (2.5 MG/3ML) 0.083% nebulizer solution Take 3 mLs (2.5 mg total) by nebulization every 6 (six) hours as needed for wheezing or shortness of breath. 12/21/23   Nyoka Cowden, MD  albuterol (VENTOLIN HFA) 108 (90 Base) MCG/ACT inhaler TAKE 2 PUFFS BY MOUTH EVERY 6 HOURS AS NEEDED FOR WHEEZE OR SHORTNESS OF BREATH 12/11/23   Nyoka Cowden, MD  AMBULATORY NON FORMULARY MEDICATION Diltiazem 2% gel/ Lidocaine 5% Using your index finger, you should apply a pea size amount of medication inside the rectum up to your first knuckle/joint twice daily x 6 weeks. 12/13/23   McMichael, Bayley M, PA-C  amLODipine (NORVASC) 5 MG tablet TAKE 1 TABLET (5 MG TOTAL) BY MOUTH DAILY. 12/17/23   Nafziger, Kandee Keen, NP  ammonium lactate (LAC-HYDRIN) 12 % lotion APPLY TO  AFFECTED AREA(S) AS NEEDED FOR DRY SKIN 01/05/20   Hyatt, Max T, DPM  atorvastatin (LIPITOR) 40 MG tablet TAKE 1 TABLET BY MOUTH EVERY DAY Patient not taking: Reported on 02/12/2024 09/12/23   Shirline Frees, NP  azelastine (OPTIVAR) 0.05 % ophthalmic solution INSTILL 1 DROP INTO BOTH EYES TWICE A DAY 11/15/21   Nafziger, Kandee Keen, NP  Azelastine HCl 137 MCG/SPRAY SOLN Place 1 spray into both nostrils 2 (two) times daily. 02/02/24   [provider]  budesonide-formoterol (SYMBICORT) 80-4.5 MCG/ACT inhaler INHALE 2 PUFFS FIRST THING IN THE MORNING, THEN ANOTHER 2 PUFFS ABOUT 12 HOURS LATER Patient not taking: Reported on 02/12/2024 01/01/24   Nyoka Cowden, MD  cetirizine (ZYRTEC) 10 MG tablet Take 1 tablet (10 mg total) by mouth daily. **DUE FOR YEARLY PHYSICAL** 04/22/20   Nafziger, Kandee Keen, NP  cyclobenzaprine (FLEXERIL) 10 MG tablet TAKE 1 TABLET BY MOUTH EVERYDAY AT BEDTIME 09/14/23   Nafziger, Kandee Keen, NP  diclofenac Sodium (VOLTAREN) 1 % GEL APPLY 2 GRAMS TO AFFECTED AREA FOUR TIMES DAILY 10/28/19   Tyrell Antonio, MD  EPINEPHrine (EPIPEN 2-PAK) 0.3 mg/0.3 mL IJ SOAJ injection Inject 0.3 mg into the muscle as needed for anaphylaxis. 10/12/22   Nafziger, Kandee Keen, NP  famotidine (PEPCID) 20 MG tablet Take 1 tablet (20 mg total) by mouth daily before breakfast. 01/10/24   Zehr, Shanda Bumps D, PA-C  fluticasone (FLONASE) 50 MCG/ACT nasal spray SPRAY 2 SPRAYS INTO [facility]  NOSTRIL EVERY DAY 04/30/23   Nafziger, Kandee Keen, NP  Galcanezumab-gnlm (EMGALITY) 120 MG/ML SOAJ Inject 1 Pen into the skin every 30 (thirty) days. 05/04/23   Van Clines, MD  hyoscyamine (LEVSIN SL) 0.125 MG SL tablet Place 1 tablet (0.125 mg total) under the tongue every 4 (four) hours as needed. Patient not taking: Reported on 02/12/2024 12/19/23 02/17/24  Legrand Como, PA-C  ibuprofen (ADVIL) 800 MG tablet TAKE 1 TABLET BY MOUTH EVERY 8 HOURS AS NEEDED Patient not taking: Reported on 02/12/2024 09/13/22   Shirline Frees, NP  ketoconazole (NIZORAL)  2 % cream APPLY TO AFFECTED AREA TWICE A DAY 01/07/24   Standiford, Jenelle Mages, DPM  Nebulizers (COMPRESSOR/NEBULIZER) MISC Use as directed 12/21/23   Nyoka Cowden, MD  omeprazole (PRILOSEC) 40 MG capsule TAKE 1 CAPSULE (40 MG TOTAL) BY MOUTH DAILY BEFORE SUPPER. 01/02/24   McMichael, Bayley M, PA-C  ondansetron (ZOFRAN-ODT) 4 MG disintegrating tablet Take 1 tablet (4 mg total) by mouth every 8 (eight) hours as needed for nausea or vomiting. 12/17/23   Schutt, Edsel Petrin, PA-C  polyethylene glycol powder (GLYCOLAX/MIRALAX) 17 GM/SCOOP powder Take 17 g by mouth daily. 10/24/22   Fayrene Helper, PA-C  Prucalopride Succinate (MOTEGRITY) 2 MG TABS Take 1 tablet (2 mg total) by mouth daily. 01/16/24   Zehr, Princella Pellegrini, PA-C  sodium phosphate (FLEET) 7-19 GM/118ML ENEM Place 133 mLs (1 enema total) rectally daily as needed for severe constipation. 10/24/22   Fayrene Helper, PA-C  topiramate (TOPAMAX) 200 MG tablet Take 1 tablet twice a day 04/04/23   Van Clines, MD  zolmitriptan (ZOMIG) 5 MG nasal solution Place 1 spray into the nose as needed for migraine. Patient not taking: Reported on 02/12/2024 04/04/23   Van Clines, MD  zolpidem (AMBIEN) 10 MG tablet TAKE 1 TABLET BY MOUTH EVERY DAY AT BEDTIME AS NEEDED 12/05/23   Shirline Frees, NP      Allergies    Fish allergy, Benzyl alcohol, Heparin, Phenergan [promethazine hcl], Coumadin [warfarin sodium], Crestor [rosuvastatin], Doxycycline, Methocarbamol, Nystatin, and Toradol [ketorolac tromethamine]    Review of Systems   Review of Systems  Gastrointestinal:  Positive for hematochezia.  All other systems reviewed and are negative.   Physical Exam Updated Vital Signs BP (!) 134/92   Pulse 88   Temp 98.6 F (37 C) (Oral)   Ht 5\' 9"  (1.753 m)   Wt 78 kg   SpO2 99%   BMI 25.40 kg/m  Physical Exam Vitals and nursing note reviewed.  Constitutional:      Appearance: She is well-developed.  HENT:     Head: Normocephalic and atraumatic.   Cardiovascular:     Rate and Rhythm: Normal rate and regular rhythm.  Pulmonary:     Effort: Pulmonary effort is normal. No respiratory distress.  Abdominal:     Palpations: Abdomen is soft.     Tenderness: There is no guarding or rebound.     Comments: Mild left hemiabdominal tenderness  Musculoskeletal:        General: No tenderness.  Skin:    General: Skin is warm and dry.  Neurological:     Mental Status: She is alert and oriented to person, place, and time.  Psychiatric:        Behavior: Behavior normal.     ED Results / Procedures / Treatments   Labs (all labs ordered are listed, but only abnormal results are displayed) Labs Reviewed  CBC WITH DIFFERENTIAL/PLATELET - Abnormal; Notable for  the following components:      Result Value   RBC 3.53 (*)    Hemoglobin 10.8 (*)    HCT 32.1 (*)    All other components within normal limits  COMPREHENSIVE METABOLIC PANEL WITH GFR  LIPASE, BLOOD  RPR  HIV ANTIBODY (ROUTINE TESTING W REFLEX)  GC/CHLAMYDIA PROBE AMP () NOT AT [facility]    EKG None  Radiology CT ABDOMEN PELVIS WO CONTRAST Result Date: 02/16/2024 CLINICAL DATA:  Status post endoscopy and colonoscopy this week with subsequent left-sided abdominal pain. EXAM: CT ABDOMEN AND PELVIS WITHOUT CONTRAST TECHNIQUE: Multidetector CT imaging of the abdomen and pelvis was performed following the standard protocol without IV contrast. RADIATION DOSE REDUCTION: This exam was performed according to the departmental dose-optimization program which includes automated exposure control, adjustment of the mA and/or kV according to patient size and/or use of iterative reconstruction technique. COMPARISON:  December 17, 2023 FINDINGS: Lower chest: No acute abnormality. Hepatobiliary: Focal fatty infiltration is seen along the falciform ligament. No gallstones, gallbladder wall thickening, or biliary dilatation. Pancreas: Unremarkable. No pancreatic ductal dilatation or surrounding  inflammatory changes. Spleen: Normal in size without focal abnormality. Adrenals/Urinary Tract: Adrenal glands are unremarkable. Kidneys are normal, without renal calculi, focal lesion, or hydronephrosis. Bladder is unremarkable. Stomach/Bowel: Stomach is within normal limits. Appendix appears normal. No evidence of bowel wall thickening, distention, or inflammatory changes. Vascular/Lymphatic: Very mild aortic atherosclerosis. No enlarged abdominal or pelvic lymph nodes. Reproductive: Status post hysterectomy. No adnexal masses. Other: No abdominal wall hernia or abnormality. No abdominopelvic ascites. Musculoskeletal: No acute or significant osseous findings. IMPRESSION: 1. No acute or active process within the abdomen or pelvis. 2. Evidence of prior hysterectomy. 3. Aortic atherosclerosis. Aortic Atherosclerosis (ICD10-I70.0). Electronically Signed   By: Aram Candela M.D.   On: 02/16/2024 04:09    Procedures Procedures    Medications Ordered in ED Medications  iohexol (OMNIPAQUE) 300 MG/ML solution 100 mL (has no administration in time range)  sodium chloride 0.9 % bolus 500 mL (0 mLs Intravenous Stopped 02/16/24 0344)  fentaNYL (SUBLIMAZE) injection 50 mcg (50 mcg Intravenous Given 02/16/24 0249)  ondansetron (ZOFRAN) injection 4 mg (4 mg Intravenous Given 02/16/24 0249)  morphine (PF) 4 MG/ML injection 4 mg (4 mg Intravenous Given 02/16/24 0433)    ED Course/ Medical Decision Making/ A&P                                 Medical Decision Making Amount and/or Complexity of Data Reviewed Labs: ordered. Radiology: ordered.  Risk Prescription drug management.   Patient with history of recurrent abdominal pain here for evaluation of left upper quadrant abdominal pain, constipation.  She did recently have upper endoscopy as well as colonoscopy.  She has mild tenderness on examination without peritoneal findings.  She does report 1 episode of blood-streaked stool.  She has a rectal exam that  does not have any gross blood present.  She was treated with fentanyl, morphine without significant change in her symptoms.  CT scan was obtained, without clear source of her symptoms.  Of note this was ordered with IV contrast, but patient reported discomfort with her IV and declined IV contrast.  This was performed noncontrasted.  In particular there is no evidence of acute diverticulitis, perforation, splenic rupture.  She is hemodynamically stable in the emergency department.  Discussed with patient unclear source of her recurrent abdominal pain.  Discussed with on-call physician for  GI, Dr.McGreal, who offered plan regarding treating constipation and confirms that biopsy results were not available.  Feel patient is stable for discharge home, will require additional outpatient follow-up for ongoing workup for her recurrent abdominal pain.  CBC does have anemia, similar but slightly downtrending when compared to prior.  There is no gross bleeding on her rectal exam, only report of 1 episode of bloody BM.  She is hemodynamically stable.  Feel she is stable for outpatient follow-up regarding this.        Final Clinical Impression(s) / ED Diagnoses Final diagnoses:  LUQ abdominal pain  Anemia, unspecified type    Rx / DC Orders ED Discharge Orders     None         Tilden Fossa, MD 02/16/24 [phone]

## 2024-02-16 NOTE — ED Triage Notes (Signed)
 Pt states she had a endoscopy and colonoscopy done on Monday, reports left sided abd pain since, pt states she has taken Miramax, suppositories, an enema, and x-lax with no relief, pt states she a small BM this am that contained bright red blood

## 2024-02-16 NOTE — Telephone Encounter (Signed)
 Contacted on-call by ED regarding Molly [name] presenting to ED this evening reporting worsening abdominal pain, constipation as well as reports of scant blood and mucus in stool status post EGD and colonoscopy last week.  Pain seems to be similar in character to chronic abdominal pain for which she was seen in the office 01/10/2024.  The blood in her stool is described as being small-volume.  Noted that she is using a Fleet enema for constipation.  Not reported to be on anticoagulation by ED.  Abdominal exam described as benign by ED physician.  EGD and colonoscopy reports were reviewed.  Biopsies pending for H. pylori.  2 subcentimeter polyps ranging from 2 to 4 mm were removed with cold snare.  Internal hemorrhoids present. Pathology is pending from these studies.  Labs overall reassuring without leukocytosis Hemoglobin down slightly from prior 11.7-10.8 LFTs normal Noncon CT without evidence of acute process in the abdomen or pelvis  The abdominal pain she is presenting to the ED for this evening seems consistent with her chronic abdominal pain.  Labs and CT have not shown any other worrisome findings to indicate additional intervention.  Reviewed that H. pylori could be a factor and biopsies remain pending.  Hopefully will have more information this week.  Pain could also be related to chronic constipation and provided ED with information for bowel cleanout at home with magnesium citrate for 6 ounce doses in 24 hours in conjunction with 2 Ex-Lax tabs.  In terms of the rectal bleeding this could be related to hemorrhoids and/or trauma from using fleets enemas at home.  It is doubtful that there is post polypectomy bleeding from polyps of the small size noted in the report.  If there were it is likely to be nominal.  I think this can be monitored clinically.  No additional workup advised at this time.  Discussed with ED that our office would contact the patient with biopsy results when they were  available later this week.

## 2024-02-17 NOTE — Telephone Encounter (Signed)
 NMG, Thank you for this note.   Patty, Please follow-up with patient on Monday. We do not have her pathology back as of yet. GM

## 2024-02-18 ENCOUNTER — Other Ambulatory Visit: Payer: Self-pay

## 2024-02-18 MED ORDER — OMEPRAZOLE 40 MG PO CPDR: 40.0000 mg | DELAYED_RELEASE_CAPSULE | Freq: Two times a day (BID) | ORAL | 2 refills | Status: DC

## 2024-02-18 NOTE — Telephone Encounter (Signed)
 Spoke with the pt and made her aware that as soon as we get the pathology results back we will call her with next steps.  She does report that her symptoms are the same- no worse no better. She is on liquids at this time and will await further calls with results.

## 2024-02-18 NOTE — Progress Notes (Signed)
 Refill sent.

## 2024-02-18 NOTE — Telephone Encounter (Signed)
 Refill sent.

## 2024-02-22 NOTE — Telephone Encounter (Signed)
 Molly [name], I have not heard any update on the pathology. I did go back and look that my other patients done on/1 have had their pathology return. Limit put Megan on here and see if she can follow-up on what has happened with the [facility] pathology.  Molly [name], This patient is still waiting for their pathology and I am wondering if there is any issue or if you could forward this to them so that we can find out why it has taken so long when other individuals done on that they have already returned. Thanks. GM

## 2024-02-22 NOTE — Telephone Encounter (Signed)
 Patient called to follow up on path results. She also stated she is having trouble with swallowing whenever she drinks anything.

## 2024-02-22 NOTE — Telephone Encounter (Signed)
 I was able to reach the pt by phone regarding path results.  I did advise that the results have still not resulted.  I have also sent to Dr Meridee Score since it has been 11 days since procedure.    Dr Meridee Score any update on the path?

## 2024-02-28 ENCOUNTER — Telehealth: Payer: Self-pay

## 2024-02-28 DIAGNOSIS — R1012 Left upper quadrant pain: Secondary | ICD-10-CM

## 2024-02-28 LAB — SURGICAL PATHOLOGY

## 2024-02-28 NOTE — Telephone Encounter (Signed)
 Per pathology the procedure that was completed on 4/1 had deeper levels x 9 ordered and results will possibly be available today.    The pt has been advised via My Chart- she does use My Chart for communication

## 2024-02-28 NOTE — Telephone Encounter (Signed)
 I am sorry to hear this. She had a CT scan after procedure showing no complication or changes in any of her organs. If she is feeling things are continue to be an issue, then I recommend another evaluation in the emergency department and a CT scan to be ordered, if she is in such significant issues/discomfort. Unfortunately due to the pathology still being unavailable, cannot make further movements and things or potential medication adjustments. Referral to [facility] as she is requested is reasonable as well, as she should be able to have the care she wants. Thanks. GM

## 2024-03-01 ENCOUNTER — Encounter: Payer: Self-pay | Admitting: Gastroenterology

## 2024-03-04 DIAGNOSIS — R1011 Right upper quadrant pain: Secondary | ICD-10-CM | POA: Diagnosis not present

## 2024-03-04 NOTE — Telephone Encounter (Signed)
 Dr Brice Campi please see the pt response and questions.

## 2024-03-04 NOTE — Telephone Encounter (Signed)
 I tried reaching the patient again this afternoon and had to leave a second voicemail. I will prepare a reply MyChart message for hopefully the majority of her questions and any further workup that I think I can offer before she is seen in secondary consultation at an academic center as per her request previously. GM

## 2024-03-04 NOTE — Telephone Encounter (Signed)
 Molly [name], I called the patient since there seems to be a lot of different things occurring at this time. I left her a voicemail and said that I would try to reach out to her again before 2 PM if possible. If I cannot reach her by the end of today, I will place thoughts in follow-up in regards to the notes that she is placing on MyChart. GM

## 2024-03-06 DIAGNOSIS — G4733 Obstructive sleep apnea (adult) (pediatric): Secondary | ICD-10-CM | POA: Diagnosis not present

## 2024-03-09 ENCOUNTER — Other Ambulatory Visit: Payer: Self-pay | Admitting: Adult Health

## 2024-03-09 ENCOUNTER — Other Ambulatory Visit: Payer: Self-pay | Admitting: Neurology

## 2024-03-09 DIAGNOSIS — Z760 Encounter for issue of repeat prescription: Secondary | ICD-10-CM

## 2024-03-10 MED ORDER — IBSRELA 50 MG PO TABS
50.0000 mg | ORAL_TABLET | Freq: Every day | ORAL | 6 refills | Status: DC
Start: 2024-03-10 — End: 2024-03-14

## 2024-03-10 NOTE — Telephone Encounter (Signed)
 Please move forward with HIDA scan as well. Thanks. GM

## 2024-03-10 NOTE — Telephone Encounter (Signed)
 Dr Brice Campi see the message from the pt. I have made the referral to [facility]

## 2024-03-10 NOTE — Telephone Encounter (Signed)
 Agree, with placement of the [facility] referral as you have done. Reading her note as well, it sounds as if she would be amenable to moving forward with an IBSrela prescription. If this is the case, then do not wait for samples, and just send her a 1 month supply with 6 refills. If she wants a clinic visit while awaiting the clinic visit at [facility], she can have that made with me or one of the APPs in the Pod. Thanks. GM

## 2024-03-10 NOTE — Addendum Note (Signed)
 Addended by: Aneita Keens on: 03/10/2024 01:32 PM   Modules accepted: Orders

## 2024-03-10 NOTE — Addendum Note (Signed)
 Addended by: Aneita Keens on: 03/10/2024 10:41 AM   Modules accepted: Orders

## 2024-03-11 ENCOUNTER — Other Ambulatory Visit: Payer: Self-pay | Admitting: Gastroenterology

## 2024-03-12 ENCOUNTER — Other Ambulatory Visit: Payer: Self-pay | Admitting: Adult Health

## 2024-03-12 ENCOUNTER — Other Ambulatory Visit (HOSPITAL_COMMUNITY): Payer: Self-pay

## 2024-03-12 ENCOUNTER — Telehealth: Payer: Self-pay

## 2024-03-12 DIAGNOSIS — Z1231 Encounter for screening mammogram for malignant neoplasm of breast: Secondary | ICD-10-CM

## 2024-03-12 NOTE — Telephone Encounter (Signed)
 PA has been started

## 2024-03-12 NOTE — Telephone Encounter (Signed)
 Dr Brice Campi can you confirm if IBS-C is the correct diagnosis

## 2024-03-12 NOTE — Telephone Encounter (Signed)
 If you could please update the most recent office visit with this diagnosis, as I will need to submit documentation of it with request. Thanks.

## 2024-03-12 NOTE — Telephone Encounter (Signed)
 Pharmacy Patient Advocate Encounter   Received notification from RX Request Messages that prior authorization for Ibsrela 50MG  tablets is required/requested.   Insurance verification completed.   The patient is insured through [facility] Medicare Part D .   Per test claim: xxx

## 2024-03-12 NOTE — Telephone Encounter (Signed)
 See note Molly [name]

## 2024-03-12 NOTE — Telephone Encounter (Signed)
 Molly [name] see note from Dr Brice Campi

## 2024-03-12 NOTE — Telephone Encounter (Signed)
 PA request has been Started. New Encounter has been or will be created for follow up. For additional info see Pharmacy Prior Auth telephone encounter from 03-12-2024.

## 2024-03-13 ENCOUNTER — Ambulatory Visit

## 2024-03-13 ENCOUNTER — Other Ambulatory Visit (HOSPITAL_COMMUNITY): Payer: Self-pay

## 2024-03-13 ENCOUNTER — Encounter: Payer: Self-pay | Admitting: Gastroenterology

## 2024-03-13 ENCOUNTER — Other Ambulatory Visit: Payer: Self-pay | Admitting: Adult Health

## 2024-03-13 ENCOUNTER — Telehealth: Payer: Self-pay | Admitting: Pharmacy Technician

## 2024-03-13 DIAGNOSIS — I10 Essential (primary) hypertension: Secondary | ICD-10-CM

## 2024-03-13 NOTE — Progress Notes (Signed)
 Brief GI Progress Notation  Overall, the patient's underlying etiology for her abdominal pain is still being worked up.  Typical etiologies have been ruled out at this point. However we are still extending further workup as outlined/evidence in [employer] and testing that are in the system. His workup to use for her left upper quadrant abdominal pain. She also as previously documented in notation has a history of chronic constipation which has been worked up with no evidence of obstructive etiologies.  She has failed multiple laxatives MiraLAX  and Dulcolax and Trulance  and Linzess  as well as stool softeners and enemas and suppositories and Motegrity . She has irritable bowel syndrome constipation, and I think she would benefit from the use of IBSrela  in an effort of trying to optimize her bowel habits and some of her abdominal pain/discomfort. Otherwise we will need to consider periodic colon cleanses/colon preparations for cleanout. We will move forward with ordering IBSrela  for patient as we continue to workup her GI symptoms. A HIDA scan has been ordered some of her upper GI symptoms as well and is pending to be scheduled/completed. She has asked for a second opinion GI clinic evaluation, which I think is certainly reasonable; a referral has already been placed to [facility] within the last week.  Yong Henle, MD [facility] Office # [phone]

## 2024-03-13 NOTE — Telephone Encounter (Signed)
 Pharmacy Patient Advocate Encounter  Received notification from [facility] MEDICAID that Prior Authorization for Ibsrela  50MG  tablets  has been APPROVED from 03/13/2024 to 11/12/2024. Ran test claim, Copay is $0.00. This test claim was processed through [facility]- copay amounts may vary at other pharmacies due to pharmacy/plan contracts, or as the patient moves through the different stages of their insurance plan.   PA #/Case ID/Reference #: [id number]

## 2024-03-13 NOTE — Telephone Encounter (Signed)
 PA request has been Submitted. New Encounter has been or will be created for follow up. For additional info see Pharmacy Prior Auth telephone encounter from 03/13/2024.

## 2024-03-13 NOTE — Telephone Encounter (Signed)
 Pharmacy Patient Advocate Encounter   Received notification from Pt Calls Messages that prior authorization for Ibsrela  50MG  tablets is required/requested.   Insurance verification completed.   The patient is insured through [facility] MEDICAID .   Per test claim: PA required; PA submitted to above mentioned insurance via CoverMyMeds Key/confirmation #/EOC [id number] Status is pending

## 2024-03-13 NOTE — Telephone Encounter (Signed)
 The pt has been advised of the approval

## 2024-03-14 ENCOUNTER — Telehealth: Payer: Self-pay | Admitting: Gastroenterology

## 2024-03-14 ENCOUNTER — Encounter: Payer: Self-pay | Admitting: Neurology

## 2024-03-14 ENCOUNTER — Other Ambulatory Visit: Payer: Self-pay | Admitting: Gastroenterology

## 2024-03-14 ENCOUNTER — Ambulatory Visit (INDEPENDENT_AMBULATORY_CARE_PROVIDER_SITE_OTHER): Admitting: Neurology

## 2024-03-14 ENCOUNTER — Other Ambulatory Visit: Payer: Self-pay | Admitting: Neurology

## 2024-03-14 VITALS — BP 123/86 | HR 95 | Ht 69.0 in | Wt 173.0 lb

## 2024-03-14 DIAGNOSIS — R569 Unspecified convulsions: Secondary | ICD-10-CM | POA: Diagnosis not present

## 2024-03-14 DIAGNOSIS — G43019 Migraine without aura, intractable, without status migrainosus: Secondary | ICD-10-CM

## 2024-03-14 MED ORDER — IBSRELA 50 MG PO TABS: 50.0000 mg | ORAL_TABLET | Freq: Two times a day (BID) | ORAL | 6 refills | Status: DC

## 2024-03-14 MED ORDER — TOPIRAMATE 200 MG PO TABS: ORAL_TABLET | ORAL | 3 refills | Status: AC

## 2024-03-14 MED ORDER — ZOLMITRIPTAN 5 MG NA SOLN: NASAL | 11 refills | Status: DC

## 2024-03-14 MED ORDER — EMGALITY 120 MG/ML ~~LOC~~ SOAJ: 1.0000 | SUBCUTANEOUS | 11 refills | Status: AC

## 2024-03-14 NOTE — Telephone Encounter (Signed)
 Patient called and stated that she was having trouble with [facility] trying to get her medication for the Ibsrela . Patient is requesting a call back. Please advise.

## 2024-03-14 NOTE — Telephone Encounter (Signed)
Refill sent by other means 
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
## 2024-03-14 NOTE — Telephone Encounter (Signed)
 According to [facility], dosing for TenapanorAlden [name]) is 50 mg BID dosing and comes manufactured in bottle of 60 tablets. They can not open bottle to give 30 tablets. Prescription will need to be changed to BID dosing and qty of 60.   FYI- PA was approved for once daily dosing, however test claim was submitted through [facility] and not [facility].  PA will more than likely have to be re-submitted. Patient has been informed and voiced understanding.   Updated RX has been to [facility].

## 2024-03-14 NOTE — Telephone Encounter (Signed)
 PA will need to be resubmitted. Please see note regarding patient call.

## 2024-03-17 ENCOUNTER — Telehealth: Payer: Self-pay | Admitting: Pharmacy Technician

## 2024-03-17 ENCOUNTER — Other Ambulatory Visit (HOSPITAL_COMMUNITY): Payer: Self-pay

## 2024-03-17 NOTE — Telephone Encounter (Signed)
 Pharmacy Patient Advocate Encounter   Received notification from RX Request Messages that prior authorization for ZOLMITRIPTAN  5MG  SOL is required/requested.   Insurance verification completed.   The patient is insured through [facility] .   Per test claim: PA required; PA submitted to above mentioned insurance via CoverMyMeds Key/confirmation #/EOC [id number] Status is pending

## 2024-03-17 NOTE — Telephone Encounter (Signed)
 PA has been submitted, and telephone encounter has been created. Please see telephone encounter dated 5.5.25.

## 2024-03-17 NOTE — Telephone Encounter (Signed)
 Cindy from [employer] called and and left a message with the after hours service. Wants to get some more clinical information for the PA. Did not leave call back number

## 2024-03-17 NOTE — Telephone Encounter (Signed)
 All test claims are processed through [facility] as we do not have access to outside pharmacies. The result of the PA does not affect the location the medication can be filled at unless stated by insurance. This is not the case with this medication.   Patient already picked up BID dosing. No additional help required from PA Team.

## 2024-03-18 ENCOUNTER — Other Ambulatory Visit (HOSPITAL_COMMUNITY): Payer: Self-pay

## 2024-03-18 NOTE — Telephone Encounter (Signed)
 Pharmacy Patient Advocate Encounter  Received notification from [employer] that Prior Authorization for ZOLMITRIPTAN  5MG  SOL has been DENIED.  Full denial letter will be uploaded to the media tab. See denial reason below.   PA #/Case ID/Reference #: [id number]

## 2024-03-19 MED ORDER — NARATRIPTAN HCL 2.5 MG PO TABS: 2.5000 mg | ORAL_TABLET | ORAL | 5 refills | Status: DC | PRN

## 2024-03-19 NOTE — Telephone Encounter (Signed)
 Molly [name], Were you able to update the Rx that we discussed last week? Thanks. GM

## 2024-03-19 NOTE — Telephone Encounter (Signed)
 Pls let her know that insurance wants us  to try naratriptan first, and if she has no improvement or has side effects, then supposedly they will approve the nasal spray. Pls let her know I sent in the naratriptan, pls have her call if no improvement/side effects and we will then appeal again. Thanks

## 2024-03-19 NOTE — Telephone Encounter (Signed)
 Thanks for all the help. GM

## 2024-03-19 NOTE — Telephone Encounter (Signed)
 Pt called an informed that insurance wants us  to try naratriptan first, and if she has no improvement or has side effects, then supposedly they will approve the nasal spray. Pt informed that Dr Ty Gales sent in the naratriptan, pls have her call if no improvement/side effects and we will then appeal again. Pt stated she will keep us  updated

## 2024-03-25 ENCOUNTER — Ambulatory Visit (HOSPITAL_COMMUNITY)
Admission: RE | Admit: 2024-03-25 | Discharge: 2024-03-25 | Disposition: A | Discharge status: Home / Self Care | Source: Ambulatory Visit | Attending: Gastroenterology | Admitting: Gastroenterology

## 2024-03-25 DIAGNOSIS — R1012 Left upper quadrant pain: Secondary | ICD-10-CM | POA: Insufficient documentation

## 2024-03-25 DIAGNOSIS — R109 Unspecified abdominal pain: Secondary | ICD-10-CM | POA: Diagnosis not present

## 2024-03-25 MED ORDER — TECHNETIUM TC 99M MEBROFENIN IV KIT
5.0500 | PACK | Freq: Once | INTRAVENOUS | Status: AC | PRN
Start: 2024-03-25 — End: 2024-03-25
  Administered 2024-03-25: 5.05 via INTRAVENOUS

## 2024-03-28 ENCOUNTER — Other Ambulatory Visit: Payer: Self-pay

## 2024-03-28 MED ORDER — ZOLMITRIPTAN 5 MG NA SOLN: NASAL | 11 refills | Status: AC

## 2024-03-28 NOTE — Telephone Encounter (Signed)
 Pt called an informed that zomig  has been approved rx sent to pharmacy with PA information

## 2024-03-30 ENCOUNTER — Ambulatory Visit: Payer: Self-pay | Admitting: Gastroenterology

## 2024-03-30 DIAGNOSIS — R1032 Left lower quadrant pain: Secondary | ICD-10-CM

## 2024-03-30 DIAGNOSIS — R1012 Left upper quadrant pain: Secondary | ICD-10-CM

## 2024-03-30 DIAGNOSIS — K5909 Other constipation: Secondary | ICD-10-CM

## 2024-04-02 ENCOUNTER — Telehealth: Payer: Self-pay

## 2024-04-02 DIAGNOSIS — G4733 Obstructive sleep apnea (adult) (pediatric): Secondary | ICD-10-CM

## 2024-04-02 NOTE — Telephone Encounter (Signed)
 Copied from CRM [id number]. Topic: Clinical - Order For Equipment >> Apr 02, 2024  9:27 AM Hilton Lucky wrote: Reason for CRM: Patient is calling in to state that her CPAP machine is not turning on. Patient states is plugging in but screen is black. States that standard troubleshooting has done nothing to help. She reached out to [facility] (on her paperwork) over a month ago and has heard nothing back. Patient requesting we reach out to inquire about a replacement.   Patient states also needs supplies, as she has been unable to get them. Hoses, straps, etc.   Pt states she needs a new CPAP machine. Pt stated that she has not had one for a month because her current one went dead and never cut on again. Pt states she has tried plugging it and tried contacting her DME but never got a response from them. I informed pt that I would try to send an order for you without having an issue of needing to repeat a study or she may just need a f/u appointment. PT verbalized understanding. Sending message to [employer].

## 2024-04-03 ENCOUNTER — Ambulatory Visit (INDEPENDENT_AMBULATORY_CARE_PROVIDER_SITE_OTHER): Admitting: Family Medicine

## 2024-04-03 ENCOUNTER — Encounter: Payer: Self-pay | Admitting: Family Medicine

## 2024-04-03 DIAGNOSIS — Z0000 Encounter for general adult medical examination without abnormal findings: Secondary | ICD-10-CM
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
## 2024-04-03 NOTE — Patient Instructions (Signed)
 I really enjoyed getting to talk with you today! I am available on Tuesdays and Thursdays for virtual visits if you have any questions or concerns, or if I can be of any further assistance.   CHECKLIST FROM ANNUAL WELLNESS VISIT:  -Follow up (please call to schedule if not scheduled after visit):   -yearly for annual wellness visit with primary care office  Here is a list of your preventive care/health maintenance measures and the plan for each if any are due:  PLAN For any measures below that may be due:   Health Maintenance  Topic Date Due   Hepatitis C Screening  Never done   Pneumococcal Vaccine 63-55 Years old (1 of 2 - PCV) Never done   COVID-19 Vaccine (1) 12/31/2027 (Originally 10/11/1981)   Medicare Annual Wellness (AWV)  04/03/2025   DTaP/Tdap/Td (2 - Td or Tdap) 06/27/2027   Colonoscopy  02/11/2034   HIV Screening  Completed   HPV VACCINES  Aged Out   Meningococcal B Vaccine  Aged Out   INFLUENZA VACCINE  Discontinued    -See a dentist at least yearly  -Get your eyes checked and then per your eye specialist's recommendations  -Other issues addressed today:   -I have included below further information regarding a healthy whole foods based diet, physical activity guidelines for adults, stress management and opportunities for social connections. I hope you find this information useful.   -----------------------------------------------------------------------------------------------------------------------------------------------------------------------------------------------------------------------------------------------------------    NUTRITION: -eat real food: lots of colorful vegetables (half the plate) and fruits -5-7 servings of vegetables and fruits per day (fresh or steamed is best), exp. 2 servings of vegetables with lunch and dinner and 2 servings of fruit per day. Berries and greens such as kale and collards are great choices.  -consume on a regular  basis:  fresh fruits, fresh veggies, fish, nuts, seeds, healthy oils (such as olive oil, avocado oil), whole grains (make sure for bread/pasta/crackers/etc., that the first ingredient on label contains the word "whole"), legumes. -can eat small amounts of dairy and lean meat (no larger than the palm of your hand), but avoid processed meats such as ham, bacon, lunch meat, etc. -drink water -try to avoid fast food and pre-packaged foods, processed meat, ultra processed foods/beverages (donuts, candy, etc.) -most experts advise limiting sodium to < 2300mg  per day, should limit further is any chronic conditions such as high blood pressure, heart disease, diabetes, etc. The American Heart Association advised that < 1500mg  is is ideal -try to avoid foods/beverages that contain any ingredients with names you do not recognize  -try to avoid foods/beverages  with added sugar or sweeteners/sweets  -try to avoid sweet drinks (including diet drinks): soda, juice, Gatorade, sweet tea, power drinks, diet drinks -try to avoid white rice, white bread, pasta (unless whole grain)  EXERCISE GUIDELINES FOR ADULTS: -if you wish to increase your physical activity, do so gradually and with the approval of your doctor -STOP and seek medical care immediately if you have any chest pain, chest discomfort or trouble breathing when starting or increasing exercise  -move and stretch your body, legs, feet and arms when sitting for long periods -Physical activity guidelines for optimal health in adults: -get at least 150 minutes per week of moderate exercise (can talk, but not sing); this is about 20-30 minutes of sustained activity 5-7 days per week or two 10-15 minute episodes of sustained activity 5-7 days per week -do some muscle building/resistance training/strength training at least 2 days per week  -balance exercises  3+ days per week:   Stand somewhere where you have something sturdy to hold onto if you lose balance    1)  lift up on toes, then back down, start with 5x per day and work up to 20x   2) stand and lift one leg straight out to the side so that foot is a few inches of the floor, start with 5x each side and work up to 20x each side   3) stand on one foot, start with 5 seconds each side and work up to 20 seconds on each side  If you need ideas or help with getting more active:  -Silver sneakers https://tools.silversneakers.com  -Walk with a Doc: http://www.duncan-williams.com/  -try to include resistance (weight lifting/strength building) and balance exercises twice per week: or the following link for ideas: http://castillo-powell.com/  BuyDucts.dk  STRESS MANAGEMENT: -can try meditating, or just sitting quietly with deep breathing while intentionally relaxing all parts of your body for 5 minutes daily -if you need further help with stress, anxiety or depression please follow up with your primary doctor or contact the wonderful folks at [facility]: [phone]  SOCIAL CONNECTIONS: -options in [place] if you wish to engage in more social and exercise related activities:  -Silver sneakers https://tools.silversneakers.com  -Walk with a Doc: http://www.duncan-williams.com/  -Check out the [facility] Active Adults 50+ section on the [place] of [employer] (hiking clubs, book clubs, cards and games, chess, exercise classes, aquatic classes and much more) - see the website for details: https://www.[place].gov/departments/parks-recreation/active-adults50  -YouTube has lots of exercise videos for different ages and abilities as well  -[facility] (a variety of indoor and outdoor inperson activities for adults). [phone]. [address].  -Virtual Online Classes (a variety of topics): see seniorplanet.org or call [phone]  -consider volunteering at a school, hospice  center, church, senior center or elsewhere

## 2024-04-03 NOTE — Progress Notes (Signed)
 Patient unable to obtain vital signs due to telehealth visit

## 2024-04-05 DIAGNOSIS — G4733 Obstructive sleep apnea (adult) (pediatric): Secondary | ICD-10-CM | POA: Diagnosis not present

## 2024-04-09 ENCOUNTER — Encounter: Payer: Self-pay | Admitting: Adult Health

## 2024-04-09 ENCOUNTER — Ambulatory Visit: Admitting: Adult Health

## 2024-04-09 VITALS — BP 120/88 | HR 103 | Temp 98.5°F | Ht 69.0 in | Wt 177.0 lb

## 2024-04-09 DIAGNOSIS — K581 Irritable bowel syndrome with constipation: Secondary | ICD-10-CM

## 2024-04-09 DIAGNOSIS — G4730 Sleep apnea, unspecified: Secondary | ICD-10-CM

## 2024-04-09 DIAGNOSIS — K219 Gastro-esophageal reflux disease without esophagitis: Secondary | ICD-10-CM | POA: Diagnosis not present

## 2024-04-09 DIAGNOSIS — G4700 Insomnia, unspecified: Secondary | ICD-10-CM

## 2024-04-09 DIAGNOSIS — F32A Depression, unspecified: Secondary | ICD-10-CM

## 2024-04-09 DIAGNOSIS — I10 Essential (primary) hypertension: Secondary | ICD-10-CM

## 2024-04-09 DIAGNOSIS — F419 Anxiety disorder, unspecified: Secondary | ICD-10-CM | POA: Diagnosis not present

## 2024-04-09 DIAGNOSIS — Z0000 Encounter for general adult medical examination without abnormal findings: Secondary | ICD-10-CM | POA: Diagnosis not present

## 2024-04-09 DIAGNOSIS — E782 Mixed hyperlipidemia: Secondary | ICD-10-CM | POA: Diagnosis not present

## 2024-04-09 DIAGNOSIS — G43001 Migraine without aura, not intractable, with status migrainosus: Secondary | ICD-10-CM | POA: Diagnosis not present

## 2024-04-09 DIAGNOSIS — G40909 Epilepsy, unspecified, not intractable, without status epilepticus: Secondary | ICD-10-CM

## 2024-04-09 DIAGNOSIS — M150 Primary generalized (osteo)arthritis: Secondary | ICD-10-CM

## 2024-04-09 MED ORDER — ONDANSETRON 4 MG PO TBDP: 4.0000 mg | ORAL_TABLET | Freq: Three times a day (TID) | ORAL | 1 refills | Status: DC | PRN
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
## 2024-04-10 LAB — CBC WITH DIFFERENTIAL/PLATELET
Basophils Absolute: 0.1 10*3/uL (ref 0.0–0.1)
Basophils Relative: 0.8 % (ref 0.0–3.0)
Eosinophils Absolute: 0 10*3/uL (ref 0.0–0.7)
Eosinophils Relative: 0.6 % (ref 0.0–5.0)
HCT: 33.5 % — ABNORMAL LOW (ref 36.0–46.0)
Hemoglobin: 11.3 g/dL — ABNORMAL LOW (ref 12.0–15.0)
Lymphocytes Relative: 29 % (ref 12.0–46.0)
Lymphs Abs: 2.1 10*3/uL (ref 0.7–4.0)
MCHC: 33.8 g/dL (ref 30.0–36.0)
MCV: 90.9 fl (ref 78.0–100.0)
Monocytes Absolute: 0.4 10*3/uL (ref 0.1–1.0)
Monocytes Relative: 6.3 % (ref 3.0–12.0)
Neutro Abs: 4.5 10*3/uL (ref 1.4–7.7)
Neutrophils Relative %: 63.3 % (ref 43.0–77.0)
Platelets: 261 10*3/uL (ref 150.0–400.0)
RBC: 3.69 Mil/uL — ABNORMAL LOW (ref 3.87–5.11)
RDW: 13.9 % (ref 11.5–15.5)
WBC: 7.1 10*3/uL (ref 4.0–10.5)

## 2024-04-10 LAB — COMPREHENSIVE METABOLIC PANEL WITH GFR
ALT: 23 U/L (ref 0–35)
AST: 20 U/L (ref 0–37)
Albumin: 4.3 g/dL (ref 3.5–5.2)
Alkaline Phosphatase: 84 U/L (ref 39–117)
BUN: 17 mg/dL (ref 6–23)
CO2: 29 meq/L (ref 19–32)
Calcium: 9.3 mg/dL (ref 8.4–10.5)
Chloride: 102 meq/L (ref 96–112)
Creatinine, Ser: 1.13 mg/dL (ref 0.40–1.20)
GFR: 58.76 mL/min — ABNORMAL LOW (ref >60.00)
Glucose, Bld: 106 mg/dL — ABNORMAL HIGH (ref 70–99)
Potassium: 3.5 meq/L (ref 3.5–5.1)
Sodium: 138 meq/L (ref 135–145)
Total Bilirubin: 0.3 mg/dL (ref 0.2–1.2)
Total Protein: 7.2 g/dL (ref 6.0–8.3)

## 2024-04-10 LAB — LIPID PANEL
Cholesterol: 282 mg/dL — ABNORMAL HIGH (ref 0–200)
HDL: 53.3 mg/dL (ref >39.00)
LDL Cholesterol: 179 mg/dL — ABNORMAL HIGH (ref 0–99)
NonHDL: 228.23
Total CHOL/HDL Ratio: 5
Triglycerides: 245 mg/dL — ABNORMAL HIGH (ref 0.0–149.0)
VLDL: 49 mg/dL — ABNORMAL HIGH (ref 0.0–40.0)

## 2024-04-10 LAB — TSH: TSH: 1.85 u[IU]/mL (ref 0.35–5.50)

## 2024-04-11 ENCOUNTER — Ambulatory Visit: Payer: Self-pay | Admitting: Adult Health

## 2024-04-11 NOTE — Telephone Encounter (Signed)
 Copied from CRM [id number]. Topic: Referral - Question >> Apr 11, 2024  1:16 PM Molly [name] wrote: Reason for CRM: Pt advise she forgot to advise Pcp to send referral to [employer] for incontinence supplies and would like a referral to kidney specialist as she looked on mychart and state her pancreas may be the issue.

## 2024-04-14 ENCOUNTER — Ambulatory Visit

## 2024-04-15 NOTE — Telephone Encounter (Signed)
 She may have celiac lab serologies performed TTG IgA and IgA level. She has constipation, so with a normal-appearing pancreas the likelihood of exocrine pancreas insufficiency is unlikely. She was to bring a stool fecal elastase and a stool fecal fat that would be the additional pancreatic testing that would be pursued. GM

## 2024-04-16 NOTE — Telephone Encounter (Signed)
 Copied from CRM [id number]. Topic: Clinical - Medication Question >> Apr 11, 2024  1:15 PM Molly [name] wrote: Reason for CRM: Pt advise after visit 05/28, she does not know what medication she is suppose to be prescribe- pt can be reached [phone]

## 2024-04-21 ENCOUNTER — Ambulatory Visit

## 2024-04-25 ENCOUNTER — Other Ambulatory Visit: Payer: Self-pay | Admitting: Adult Health

## 2024-04-25 ENCOUNTER — Other Ambulatory Visit: Payer: Self-pay | Admitting: Podiatry

## 2024-04-25 ENCOUNTER — Other Ambulatory Visit: Payer: Self-pay | Admitting: Neurology

## 2024-04-25 DIAGNOSIS — G4700 Insomnia, unspecified: Secondary | ICD-10-CM

## 2024-04-25 NOTE — Telephone Encounter (Signed)
 Okay for refill?

## 2024-04-30 ENCOUNTER — Other Ambulatory Visit: Payer: Self-pay | Admitting: Primary Care

## 2024-05-18 ENCOUNTER — Other Ambulatory Visit: Payer: Self-pay | Admitting: Adult Health

## 2024-05-18 ENCOUNTER — Other Ambulatory Visit: Payer: Self-pay | Admitting: Gastroenterology

## 2024-06-04 ENCOUNTER — Other Ambulatory Visit: Payer: Self-pay | Admitting: Adult Health

## 2024-06-08 ENCOUNTER — Other Ambulatory Visit: Payer: Self-pay | Admitting: Adult Health

## 2024-06-08 DIAGNOSIS — I10 Essential (primary) hypertension: Secondary | ICD-10-CM

## 2024-06-25 ENCOUNTER — Telehealth: Payer: Self-pay | Admitting: Neurology

## 2024-06-25 ENCOUNTER — Telehealth: Payer: Self-pay | Admitting: Adult Health

## 2024-06-25 ENCOUNTER — Ambulatory Visit: Payer: Self-pay

## 2024-06-25 NOTE — Telephone Encounter (Signed)
 This RN attempted to contact patient, no answer, unable to leave voicemail.

## 2024-06-25 NOTE — Telephone Encounter (Signed)
 Pt notified that this was taking care of back in May. Pt stated that they had not received the PPW. PPW faxed with [employer]. Pt notified of update.

## 2024-06-25 NOTE — Telephone Encounter (Signed)
 Copied from CRM [id number]. Topic: Referral - Question >> Jun 25, 2024  3:42 PM Drema MATSU wrote: Reason for CRM: Patient sent a referral in to the office for incontinence supplies. She stated that no one has responded. She states that she really need the approval becuase her insurance will take care of it.

## 2024-06-25 NOTE — Telephone Encounter (Signed)
 Unsuccessful attempt to contact patient but noted that she spoke with clinic staff at 4:40PM today

## 2024-06-25 NOTE — Telephone Encounter (Signed)
        Copied from CRM #[id number]. Topic: Clinical - Medication Question >> Jun 25, 2024  3:44 PM Drema MATSU wrote: Reason for CRM: Patient states that the blood pressure medication is causing her to itch really bad internally, has a cotton feeling in throat, and she wants to know if she can be switched to something else. *Pt does not know the name of blood pressure medication but it may be Amlodipine .(Not sure)

## 2024-06-25 NOTE — Telephone Encounter (Signed)
 Patient states that she wants to speak with someone about her seizures. She is having some really bad ones here lately. Please call

## 2024-06-26 NOTE — Telephone Encounter (Signed)
 Close encounter

## 2024-06-27 NOTE — Telephone Encounter (Signed)
 Pt called back to speak with christy. She said she doesn't really deal with mychart.

## 2024-06-27 NOTE — Telephone Encounter (Signed)
 I advised per Dr.Aquino to take all medication as prescribed and to call office with update next week. Answered all questions and verbally understood, patient thanked me for calling.

## 2024-06-27 NOTE — Telephone Encounter (Signed)
 I left message to call office.

## 2024-06-27 NOTE — Telephone Encounter (Signed)
 Main trigger for seizures is missing medication. If seizures continue despite taking Topamax  regularly, we may need to add on another medication.She has been sensitive to medications in the past, but we can certainly try again. Thanks

## 2024-07-11 DIAGNOSIS — H524 Presbyopia: Secondary | ICD-10-CM | POA: Diagnosis not present

## 2024-07-29 ENCOUNTER — Other Ambulatory Visit: Payer: Self-pay | Admitting: Adult Health

## 2024-07-29 DIAGNOSIS — I10 Essential (primary) hypertension: Secondary | ICD-10-CM

## 2024-08-06 ENCOUNTER — Other Ambulatory Visit: Payer: Self-pay | Admitting: Adult Health

## 2024-08-06 DIAGNOSIS — G4700 Insomnia, unspecified: Secondary | ICD-10-CM

## 2024-08-07 NOTE — Telephone Encounter (Signed)
 Okay for refill?

## 2024-09-01 ENCOUNTER — Other Ambulatory Visit: Payer: Self-pay | Admitting: Adult Health

## 2024-09-01 DIAGNOSIS — Z760 Encounter for issue of repeat prescription: Secondary | ICD-10-CM

## 2024-09-10 ENCOUNTER — Ambulatory Visit: Payer: Self-pay

## 2024-09-10 NOTE — Telephone Encounter (Signed)
 FYI Only or Action Required?: FYI only for provider: UC.  Patient was last seen in primary care on 04/09/2024 by Merna Huxley, NP.  Called Nurse Triage reporting Sore Throat.  Symptoms began today.  Interventions attempted: Rest, hydration, or home remedies.  Symptoms are: unchanged.  Triage Disposition: See Physician Within 24 Hours  Patient/caregiver understands and will follow disposition?: Yes  Copied from CRM #[id number]. Topic: Clinical - Red Word Triage >> Sep 10, 2024  2:40 PM Dedra B wrote: Red Word that prompted transfer to Nurse Triage: Pt said she's having body aches and throat hurting really bad. Warm transfer to NT. Reason for Disposition  SEVERE throat pain (e.g., excruciating)  Answer Assessment - Initial Assessment Questions No available appts today. Advised UC today and ED if symptoms worsen.  1. ONSET: When did the throat start hurting? (Hours or days ago)      today 2. SEVERITY: How bad is the sore throat? (Scale 1-10; mild, moderate or severe)     10/10; feels like razors; painful swallowing, used salt water and peroxide gargle 3. STREP EXPOSURE: Has there been any exposure to strep within the past week? If Yes, ask: What type of contact occurred?      2 sick exposure 4.  VIRAL SYMPTOMS: Are there any symptoms of a cold, such as a runny nose, cough, hoarse voice or red eyes?      denies 5. FEVER: Do you have a fever? If Yes, ask: What is your temperature, how was it measured, and when did it start?     Denies fever, chills, n/v 6. PUS ON THE TONSILS: Is there pus on the tonsils in the back of your throat?    Reports swelling of tonsils Denies redness, pus, no drooling or difficulty swallowing,  7. OTHER SYMPTOMS: Do you have any other symptoms? (e.g., difficulty breathing, headache, rash)     Body aches Denies difficulty breathing, dizziness, HA, chest pain  Protocols used: Sore Throat-A-AH

## 2024-09-11 NOTE — Telephone Encounter (Signed)
 Spoke to pt and she stated that she will wait it out at home. Pt stated she is taking medication and if sx worsens then she will call back for appt.

## 2024-10-01 ENCOUNTER — Encounter: Payer: Self-pay | Admitting: Neurology

## 2024-10-14 ENCOUNTER — Other Ambulatory Visit: Payer: Self-pay | Admitting: Adult Health

## 2024-11-08 ENCOUNTER — Other Ambulatory Visit: Payer: Self-pay | Admitting: Neurology

## 2024-11-10 ENCOUNTER — Other Ambulatory Visit: Payer: Self-pay

## 2024-11-10 MED ORDER — IBSRELA 50 MG PO TABS: 50.0000 mg | ORAL_TABLET | Freq: Two times a day (BID) | ORAL | 0 refills | Status: AC

## 2024-11-16 ENCOUNTER — Other Ambulatory Visit: Payer: Self-pay | Admitting: Internal Medicine

## 2024-11-17 DIAGNOSIS — G4700 Insomnia, unspecified: Secondary | ICD-10-CM

## 2024-11-18 NOTE — Telephone Encounter (Signed)
 Okay for refill?

## 2024-11-27 ENCOUNTER — Ambulatory Visit (INDEPENDENT_AMBULATORY_CARE_PROVIDER_SITE_OTHER)

## 2024-11-27 ENCOUNTER — Telehealth: Payer: Self-pay

## 2024-11-27 DIAGNOSIS — G43019 Migraine without aura, intractable, without status migrainosus: Secondary | ICD-10-CM | POA: Diagnosis not present

## 2024-11-27 MED ORDER — METOCLOPRAMIDE HCL 5 MG/ML IJ SOLN
10.0000 mg | Freq: Once | INTRAMUSCULAR | Status: AC
  Administered 2024-11-27: 10 mg via INTRAMUSCULAR

## 2024-11-27 MED ORDER — KETOROLAC TROMETHAMINE 60 MG/2ML IM SOLN
60.0000 mg | Freq: Once | INTRAMUSCULAR | Status: AC
  Administered 2024-11-27: 60 mg via INTRAMUSCULAR

## 2024-11-27 MED ORDER — DIPHENHYDRAMINE HCL 50 MG/ML IJ SOLN
50.0000 mg | Freq: Once | INTRAMUSCULAR | Status: AC
  Administered 2024-11-27: 25 mg via INTRAMUSCULAR

## 2024-11-27 NOTE — Progress Notes (Signed)
 Pt came in today for a headache cocktail she was informed before she came in that it had ketorolac , diphenhydramine , and metoclopramide . Pt still came in for the headache cocktail she is allergic to ketorolac  Dr Georjean was advised. She stated to ask the pt if she still wanted it or the prednisone  pt stated that she wanted the cocktail because she was tired of having the headache, I went overt the risk of given the cocktail that she could have a reaction. In her chart states she has haves, she says that if she does she will take a benadryl . I went and pulled up the injection. I had Mitzie Furnace CMA go in with me in the room and again went over the risk again of a reaction and she stated that she still wanted the injection so she was given the shot, I did stay with the pt for a while she was advised that she could not drive. Her driver was with her in the office and she left with him. He stated that he would keep an eye on her. She was going home to rest,

## 2024-11-27 NOTE — Telephone Encounter (Signed)
 She is still doing the emgality . Pt is going to try and get here today for a headache cocktail, she would like to add another seizure medication,

## 2024-11-27 NOTE — Telephone Encounter (Signed)
 Pt has had a headache she can't get ride of has had it for 2 weeks had a seizure 2 weeks ago. Pt c/o: seizure Missed medications?  No. Sleep deprived?  Yes.   Cpap machine is broken she has to pick it up they will not mail it to her,  Alcohol intake?  No. Increased stress? No. Any change in medication color or shape? No. Back to their usual baseline self?  Yes.  . If no, advise go to ER Current medications prescribed by Dr. Georjean:  topiramate  (TOPAMAX ) 200 MG tablet Take 1 tablet twice a day   She felt her self about to have it went to the bathroom went out in the bathroom. Having trouble breathing her she machine to sleep with is messed up  Her eye jumping and temple pain for the headache  For her headache with her headache medication she has taken Excedrin migraine  She tried Zomig  twice this week

## 2024-11-27 NOTE — Telephone Encounter (Signed)
 Is she still on Emgality  for the migraines? For this current headache, if someone can bring her for migraine cocktail, we can do that. If not, okay for prednisone  taper if she wishes. We have discussed adding another seizure medication in the past but she declined, is she interested in adding another one now? Thanks

## 2024-11-29 ENCOUNTER — Other Ambulatory Visit: Payer: Self-pay | Admitting: Adult Health

## 2024-11-29 DIAGNOSIS — I10 Essential (primary) hypertension: Secondary | ICD-10-CM

## 2024-12-04 ENCOUNTER — Ambulatory Visit: Payer: Self-pay

## 2024-12-04 NOTE — Telephone Encounter (Signed)
 FYI Only or Action Required?: FYI only for provider: Advised pt to reach out to surrounding ENT clinics to see if she could get a cortisone shot in her keloid scar.  Patient was last seen in primary care on 04/09/2024 by Merna Huxley, NP.  Called Nurse Triage reporting Ear Problem and Sore.  Symptoms began several months ago.  Interventions attempted: Prescription medications: Cortisone shot and Other: Spraying with sea salt spray.  Symptoms are: unchanged.  Triage Disposition: Call Specialist When Office Open  Patient/caregiver understands and will follow disposition?: Yes    Past several months pt has had mild intermittent bleeding from top of outer left ear cartilage. Occurred after getting ear pierced and developed a keloid. Gets cortisone shots for it. ENT provider that normally does this won't be available until February. Has been spraying sea salt on it and covering with a band-aid. Alternates between slow trickle of blood and scabbing. Some nerve pain that radiates to face. Pt wanting to know if she can get keloid cortisone injections at PCP office. This RN notified pt that there unfortunately no offices in pt region that offer keloid injections. Pt stated she would reach out to some of the other ENT offices to see if they can get her in sooner. Advised UC or ED in the meantime for worsening symptoms.    Message from Madison F sent at 12/04/2024  4:34 PM EST  Reason for Triage: Patient having pain and bleeding on the top of left ear and wants to know if the office does cortisone injections?   Answer Assessment - Initial Assessment Questions 1. APPEARANCE of SORES: What do the sores look like?     1 keloid on left  2. NUMBER: How many sores are there?     1 keloid on left  3. SIZE: How big is the largest sore?     Eraser head  4. LOCATION: Where are the sores located?     Left outer upper ear  5. ONSET: When did the sores begin?     Several months ago  6.  TENDER: Does it hurt when you touch it?  (Scale 1-10; or mild, moderate, severe)      Yes, very sensitive to touch.  7. CAUSE: What do you think is causing the sores?     Persistent bleeding keloid scar  8. OTHER SYMPTOMS: Do you have any other symptoms? (e.g., fever, new weakness)     Nerve pain in face from the scar  Protocols used: [facility]

## 2024-12-05 NOTE — Telephone Encounter (Signed)
 Patient notified of update  and verbalized understanding.

## 2024-12-05 NOTE — Telephone Encounter (Signed)
 Please advise.

## 2024-12-06 ENCOUNTER — Other Ambulatory Visit: Payer: Self-pay | Admitting: Gastroenterology

## 2025-03-16 ENCOUNTER — Ambulatory Visit: Admitting: Neurology
